# Patient Record
Sex: Male | Born: 1950 | Hispanic: No | Marital: Married | Smoking: Never smoker
Health system: Southern US, Community
[De-identification: ages and names within clinical notes are randomized; demographics above are authoritative.]

## PROBLEM LIST (undated history)

## (undated) DIAGNOSIS — Z8616 Personal history of COVID-19: Secondary | ICD-10-CM

## (undated) DIAGNOSIS — N400 Enlarged prostate without lower urinary tract symptoms: Secondary | ICD-10-CM

## (undated) DIAGNOSIS — J310 Chronic rhinitis: Secondary | ICD-10-CM

## (undated) DIAGNOSIS — E785 Hyperlipidemia, unspecified: Secondary | ICD-10-CM

## (undated) DIAGNOSIS — Z86010 Personal history of colonic polyps: Secondary | ICD-10-CM

## (undated) DIAGNOSIS — K4090 Unilateral inguinal hernia, without obstruction or gangrene, not specified as recurrent: Secondary | ICD-10-CM

## (undated) DIAGNOSIS — F4320 Adjustment disorder, unspecified: Secondary | ICD-10-CM

## (undated) DIAGNOSIS — F988 Other specified behavioral and emotional disorders with onset usually occurring in childhood and adolescence: Secondary | ICD-10-CM

## (undated) HISTORY — DX: Other specified behavioral and emotional disorders with onset usually occurring in childhood and adolescence: F98.8

## (undated) HISTORY — PX: CYSTOSCOPY: SUR368

## (undated) HISTORY — DX: Benign prostatic hyperplasia without lower urinary tract symptoms: N40.0

## (undated) HISTORY — DX: Adjustment disorder, unspecified: F43.20

## (undated) HISTORY — DX: Personal history of colonic polyps: Z86.010

## (undated) HISTORY — PX: KNEE ARTHROSCOPY: SHX127

## (undated) HISTORY — DX: Personal history of COVID-19: Z86.16

## (undated) HISTORY — DX: Chronic rhinitis: J31.0

## (undated) HISTORY — DX: Hyperlipidemia, unspecified: E78.5

---

## 1999-10-04 ENCOUNTER — Encounter: Admission: RE | Admit: 1999-10-04 | Discharge: 1999-10-29 | Payer: Self-pay | Admitting: Family Medicine

## 2002-02-11 ENCOUNTER — Ambulatory Visit (HOSPITAL_BASED_OUTPATIENT_CLINIC_OR_DEPARTMENT_OTHER): Admission: RE | Admit: 2002-02-11 | Discharge: 2002-02-11 | Payer: Self-pay | Admitting: Urology

## 2004-08-06 ENCOUNTER — Ambulatory Visit: Payer: Self-pay | Admitting: Internal Medicine

## 2004-08-14 ENCOUNTER — Ambulatory Visit: Payer: Self-pay | Admitting: Internal Medicine

## 2004-08-24 ENCOUNTER — Ambulatory Visit: Payer: Self-pay | Admitting: Internal Medicine

## 2005-01-10 ENCOUNTER — Ambulatory Visit: Payer: Self-pay | Admitting: Internal Medicine

## 2005-08-06 ENCOUNTER — Ambulatory Visit: Payer: Self-pay | Admitting: Internal Medicine

## 2005-08-13 ENCOUNTER — Ambulatory Visit: Payer: Self-pay | Admitting: Internal Medicine

## 2006-08-06 ENCOUNTER — Ambulatory Visit: Payer: Self-pay | Admitting: Internal Medicine

## 2006-08-06 LAB — CONVERTED CEMR LAB
ALT: 16 units/L (ref 0–40)
AST: 27 units/L (ref 0–37)
Albumin: 4 g/dL (ref 3.5–5.2)
Basophils Absolute: 0 10*3/uL (ref 0.0–0.1)
Basophils Relative: 0.3 % (ref 0.0–1.0)
Cholesterol: 206 mg/dL (ref 0–200)
GFR calc non Af Amer: 92 mL/min
Glomerular Filtration Rate, Af Am: 112 mL/min/{1.73_m2}
Monocytes Relative: 6.6 % (ref 3.0–11.0)
Neutro Abs: 3.7 10*3/uL (ref 1.4–7.7)
Neutrophils Relative %: 60.7 % (ref 43.0–77.0)
PSA: 0.84 ng/mL (ref 0.10–4.00)
TSH: 1.2 microintl units/mL (ref 0.35–5.50)
Total Bilirubin: 1.1 mg/dL (ref 0.3–1.2)
Triglyceride fasting, serum: 36 mg/dL (ref 0–149)

## 2006-09-10 ENCOUNTER — Ambulatory Visit: Payer: Self-pay | Admitting: Internal Medicine

## 2006-12-02 ENCOUNTER — Ambulatory Visit: Payer: Self-pay | Admitting: Internal Medicine

## 2007-07-10 ENCOUNTER — Telehealth: Payer: Self-pay | Admitting: *Deleted

## 2007-07-14 ENCOUNTER — Telehealth: Payer: Self-pay | Admitting: Internal Medicine

## 2007-07-18 HISTORY — PX: TOTAL KNEE ARTHROPLASTY: SHX125

## 2007-07-29 ENCOUNTER — Encounter: Payer: Self-pay | Admitting: Internal Medicine

## 2007-07-30 ENCOUNTER — Encounter: Payer: Self-pay | Admitting: Internal Medicine

## 2007-07-31 ENCOUNTER — Encounter: Admission: RE | Admit: 2007-07-31 | Discharge: 2007-09-07 | Payer: Self-pay | Admitting: Orthopedic Surgery

## 2007-09-03 ENCOUNTER — Ambulatory Visit: Payer: Self-pay | Admitting: Internal Medicine

## 2007-09-03 LAB — CONVERTED CEMR LAB
Bilirubin Urine: NEGATIVE
Ketones, urine, test strip: NEGATIVE
Nitrite: NEGATIVE
Specific Gravity, Urine: 1.025
WBC Urine, dipstick: NEGATIVE
pH: 5

## 2007-09-06 LAB — CONVERTED CEMR LAB
AST: 20 units/L (ref 0–37)
Albumin: 3.7 g/dL (ref 3.5–5.2)
Basophils Absolute: 0 10*3/uL (ref 0.0–0.1)
Basophils Relative: 0 % (ref 0.0–1.0)
Bilirubin, Direct: 0.2 mg/dL (ref 0.0–0.3)
CO2: 32 meq/L (ref 19–32)
Calcium: 9.6 mg/dL (ref 8.4–10.5)
Creatinine, Ser: 0.9 mg/dL (ref 0.4–1.5)
Eosinophils Absolute: 0.2 10*3/uL (ref 0.0–0.6)
Eosinophils Relative: 4.2 % (ref 0.0–5.0)
HDL: 35.6 mg/dL — ABNORMAL LOW (ref >39.0)
Hemoglobin: 13.1 g/dL (ref 13.0–17.0)
LDL Cholesterol: 130 mg/dL — ABNORMAL HIGH (ref 0–99)
Monocytes Absolute: 0.4 10*3/uL (ref 0.2–0.7)
Monocytes Relative: 7.6 % (ref 3.0–11.0)
PSA: 0.87 ng/mL (ref 0.10–4.00)
Platelets: 333 10*3/uL (ref 150–400)
RDW: 12.1 % (ref 11.5–14.6)
Total Protein: 7.1 g/dL (ref 6.0–8.3)
WBC: 5.9 10*3/uL (ref 4.5–10.5)

## 2007-09-08 ENCOUNTER — Telehealth: Payer: Self-pay | Admitting: *Deleted

## 2007-09-29 ENCOUNTER — Ambulatory Visit: Payer: Self-pay | Admitting: Internal Medicine

## 2007-09-29 DIAGNOSIS — IMO0002 Reserved for concepts with insufficient information to code with codable children: Secondary | ICD-10-CM | POA: Insufficient documentation

## 2007-09-29 DIAGNOSIS — E785 Hyperlipidemia, unspecified: Secondary | ICD-10-CM | POA: Insufficient documentation

## 2007-09-29 DIAGNOSIS — R7309 Other abnormal glucose: Secondary | ICD-10-CM | POA: Insufficient documentation

## 2007-09-29 DIAGNOSIS — F528 Other sexual dysfunction not due to a substance or known physiological condition: Secondary | ICD-10-CM | POA: Insufficient documentation

## 2007-09-29 DIAGNOSIS — M1710 Unilateral primary osteoarthritis, unspecified knee: Secondary | ICD-10-CM

## 2007-09-29 DIAGNOSIS — F4320 Adjustment disorder, unspecified: Secondary | ICD-10-CM | POA: Insufficient documentation

## 2007-09-29 DIAGNOSIS — F988 Other specified behavioral and emotional disorders with onset usually occurring in childhood and adolescence: Secondary | ICD-10-CM | POA: Insufficient documentation

## 2007-09-29 HISTORY — DX: Adjustment disorder, unspecified: F43.20

## 2007-10-20 ENCOUNTER — Ambulatory Visit: Payer: Self-pay | Admitting: Internal Medicine

## 2007-12-30 ENCOUNTER — Ambulatory Visit: Payer: Self-pay | Admitting: Internal Medicine

## 2007-12-30 LAB — CONVERTED CEMR LAB
HDL: 49.3 mg/dL (ref >39.0)
Hgb A1c MFr Bld: 5.8 % (ref 4.6–6.0)
Total CHOL/HDL Ratio: 4.5
VLDL: 9 mg/dL (ref 0–40)

## 2008-01-06 ENCOUNTER — Ambulatory Visit: Payer: Self-pay | Admitting: Internal Medicine

## 2008-03-08 ENCOUNTER — Ambulatory Visit: Payer: Self-pay | Admitting: Internal Medicine

## 2008-03-08 DIAGNOSIS — T887XXA Unspecified adverse effect of drug or medicament, initial encounter: Secondary | ICD-10-CM | POA: Insufficient documentation

## 2008-03-08 LAB — CONVERTED CEMR LAB
AST: 26 units/L (ref 0–37)
Albumin: 3.9 g/dL (ref 3.5–5.2)
Alkaline Phosphatase: 58 units/L (ref 39–117)
LDL Cholesterol: 115 mg/dL — ABNORMAL HIGH (ref 0–99)
Total CHOL/HDL Ratio: 3.6
Total Protein: 7.6 g/dL (ref 6.0–8.3)

## 2008-03-11 ENCOUNTER — Telehealth: Payer: Self-pay | Admitting: Internal Medicine

## 2008-04-13 ENCOUNTER — Telehealth: Payer: Self-pay | Admitting: *Deleted

## 2008-06-21 ENCOUNTER — Ambulatory Visit: Payer: Self-pay | Admitting: Internal Medicine

## 2008-06-23 ENCOUNTER — Telehealth: Payer: Self-pay | Admitting: Internal Medicine

## 2008-06-27 ENCOUNTER — Ambulatory Visit: Payer: Self-pay | Admitting: Internal Medicine

## 2008-06-30 ENCOUNTER — Ambulatory Visit: Payer: Self-pay | Admitting: Internal Medicine

## 2008-06-30 ENCOUNTER — Encounter: Payer: Self-pay | Admitting: Internal Medicine

## 2008-06-30 DIAGNOSIS — Z860100 Personal history of colon polyps, unspecified: Secondary | ICD-10-CM

## 2008-06-30 HISTORY — DX: Personal history of colonic polyps: Z86.010

## 2008-06-30 HISTORY — DX: Personal history of colon polyps, unspecified: Z86.0100

## 2008-07-06 ENCOUNTER — Encounter: Payer: Self-pay | Admitting: Internal Medicine

## 2008-07-06 ENCOUNTER — Ambulatory Visit: Payer: Self-pay | Admitting: Internal Medicine

## 2008-07-06 DIAGNOSIS — R05 Cough: Secondary | ICD-10-CM

## 2008-07-06 DIAGNOSIS — R059 Cough, unspecified: Secondary | ICD-10-CM | POA: Insufficient documentation

## 2008-08-22 ENCOUNTER — Telehealth: Payer: Self-pay | Admitting: *Deleted

## 2008-11-14 ENCOUNTER — Telehealth: Payer: Self-pay | Admitting: *Deleted

## 2008-11-29 ENCOUNTER — Ambulatory Visit: Payer: Self-pay | Admitting: Internal Medicine

## 2008-11-29 LAB — CONVERTED CEMR LAB
AST: 28 units/L (ref 0–37)
Alkaline Phosphatase: 62 units/L (ref 39–117)
BUN: 20 mg/dL (ref 6–23)
Blood in Urine, dipstick: NEGATIVE
CO2: 31 meq/L (ref 19–32)
Chloride: 105 meq/L (ref 96–112)
Eosinophils Absolute: 0.2 10*3/uL (ref 0.0–0.7)
GFR calc non Af Amer: 105.04 mL/min (ref >60)
Glucose, Bld: 91 mg/dL (ref 70–99)
Glucose, Urine, Semiquant: NEGATIVE
HDL: 46.6 mg/dL (ref >39.00)
Ketones, urine, test strip: NEGATIVE
LDL Cholesterol: 113 mg/dL — ABNORMAL HIGH (ref 0–99)
Monocytes Absolute: 0.5 10*3/uL (ref 0.1–1.0)
Nitrite: NEGATIVE
PSA: 0.79 ng/mL (ref 0.10–4.00)
Protein, U semiquant: NEGATIVE
RDW: 12.3 % (ref 11.5–14.6)
Sodium: 142 meq/L (ref 135–145)
Specific Gravity, Urine: 1.02
TSH: 1.62 microintl units/mL (ref 0.35–5.50)
Total CHOL/HDL Ratio: 4
Triglycerides: 57 mg/dL (ref 0.0–149.0)
VLDL: 11.4 mg/dL (ref 0.0–40.0)
WBC Urine, dipstick: NEGATIVE
WBC: 7.7 10*3/uL (ref 4.5–10.5)

## 2008-12-09 ENCOUNTER — Ambulatory Visit: Payer: Self-pay | Admitting: Internal Medicine

## 2009-03-07 ENCOUNTER — Ambulatory Visit: Payer: Self-pay | Admitting: Internal Medicine

## 2009-04-03 ENCOUNTER — Telehealth: Payer: Self-pay | Admitting: Internal Medicine

## 2009-04-17 ENCOUNTER — Telehealth: Payer: Self-pay | Admitting: Internal Medicine

## 2009-04-20 ENCOUNTER — Ambulatory Visit: Payer: Self-pay | Admitting: Internal Medicine

## 2009-04-20 DIAGNOSIS — F329 Major depressive disorder, single episode, unspecified: Secondary | ICD-10-CM | POA: Insufficient documentation

## 2009-04-21 ENCOUNTER — Ambulatory Visit: Payer: Self-pay | Admitting: Licensed Clinical Social Worker

## 2009-04-21 ENCOUNTER — Emergency Department (HOSPITAL_COMMUNITY): Admission: EM | Admit: 2009-04-21 | Discharge: 2009-04-22 | Payer: Self-pay | Admitting: Emergency Medicine

## 2009-11-29 ENCOUNTER — Ambulatory Visit: Payer: Self-pay | Admitting: Internal Medicine

## 2009-11-29 LAB — CONVERTED CEMR LAB
ALT: 28 units/L (ref 0–53)
AST: 32 units/L (ref 0–37)
Alkaline Phosphatase: 57 units/L (ref 39–117)
BUN: 21 mg/dL (ref 6–23)
Basophils Absolute: 0.1 10*3/uL (ref 0.0–0.1)
Bilirubin Urine: NEGATIVE
Blood in Urine, dipstick: NEGATIVE
Cholesterol: 209 mg/dL — ABNORMAL HIGH (ref 0–200)
Glucose, Urine, Semiquant: NEGATIVE
HCT: 44.3 % (ref 39.0–52.0)
HDL: 51.1 mg/dL (ref >39.00)
Hemoglobin: 14.5 g/dL (ref 13.0–17.0)
Ketones, urine, test strip: NEGATIVE
MCHC: 32.7 g/dL (ref 30.0–36.0)
MCV: 93.1 fL (ref 78.0–100.0)
Monocytes Absolute: 0.6 10*3/uL (ref 0.1–1.0)
Neutro Abs: 3.2 10*3/uL (ref 1.4–7.7)
Neutrophils Relative %: 48.5 % (ref 43.0–77.0)
PSA: 0.96 ng/mL (ref 0.10–4.00)
Potassium: 5 meq/L (ref 3.5–5.1)
Protein, U semiquant: NEGATIVE
RDW: 12.4 % (ref 11.5–14.6)
Sodium: 146 meq/L — ABNORMAL HIGH (ref 135–145)
TSH: 2.24 microintl units/mL (ref 0.35–5.50)
Total Bilirubin: 0.6 mg/dL (ref 0.3–1.2)
Total Protein: 8.4 g/dL — ABNORMAL HIGH (ref 6.0–8.3)
VLDL: 24.4 mg/dL (ref 0.0–40.0)
pH: 6

## 2009-12-15 ENCOUNTER — Ambulatory Visit: Payer: Self-pay | Admitting: Internal Medicine

## 2009-12-15 DIAGNOSIS — F1021 Alcohol dependence, in remission: Secondary | ICD-10-CM | POA: Insufficient documentation

## 2010-02-09 ENCOUNTER — Ambulatory Visit: Payer: Self-pay | Admitting: Internal Medicine

## 2010-02-09 LAB — CONVERTED CEMR LAB
ALT: 21 units/L (ref 0–53)
AST: 27 units/L (ref 0–37)
Albumin: 4 g/dL (ref 3.5–5.2)
Alkaline Phosphatase: 66 units/L (ref 39–117)
Cholesterol: 173 mg/dL (ref 0–200)
LDL Cholesterol: 117 mg/dL — ABNORMAL HIGH (ref 0–99)
Total Bilirubin: 0.4 mg/dL (ref 0.3–1.2)
Triglycerides: 52 mg/dL (ref 0.0–149.0)
VLDL: 10.4 mg/dL (ref 0.0–40.0)

## 2010-02-16 ENCOUNTER — Ambulatory Visit: Payer: Self-pay | Admitting: Internal Medicine

## 2010-02-16 ENCOUNTER — Telehealth: Payer: Self-pay | Admitting: *Deleted

## 2010-07-13 ENCOUNTER — Ambulatory Visit: Payer: Self-pay | Admitting: Internal Medicine

## 2010-07-13 LAB — CONVERTED CEMR LAB
Cholesterol: 180 mg/dL (ref 0–200)
VLDL: 15.8 mg/dL (ref 0.0–40.0)

## 2010-07-20 ENCOUNTER — Ambulatory Visit: Payer: Self-pay | Admitting: Internal Medicine

## 2010-08-17 ENCOUNTER — Ambulatory Visit: Payer: Self-pay | Admitting: Internal Medicine

## 2010-08-17 DIAGNOSIS — M79609 Pain in unspecified limb: Secondary | ICD-10-CM | POA: Insufficient documentation

## 2010-09-02 ENCOUNTER — Telehealth: Payer: Self-pay | Admitting: Internal Medicine

## 2010-10-18 NOTE — Progress Notes (Signed)
Summary: QUESTION ABOUT POTENTIAL LABS  Phone Note Call from Patient   Caller: Patient  [phone] Reason for Call: Talk to Doctor Summary of Call: Pt was in for OV today and made f/u appt with Dr Fabian Sharp in November 2011 but he wants to know if he needs to have any labwork done prior to his next OV...... Can you advise?  Initial call taken by: Debbra Riding,  February 16, 2010 9:32 AM  Follow-up for Phone Call        can do lipids panel  before next visit    . Not  due for full set of labs until  March 2012 Follow-up by: Madelin Headings MD,  February 19, 2010 1:15 PM  Additional Follow-up for Phone Call Additional follow up Details #1::        Left message on machine to call back to schedule fasting lipids.  Additional Follow-up by: Romualdo Bolk, CMA (AAMA),  February 19, 2010 1:18 PM

## 2010-10-18 NOTE — Assessment & Plan Note (Signed)
Summary: 5 MONTH ROV/NJR   Vital Signs:  Patient profile:   60 year old male Weight:      203 pounds BMI:     30.09 Pulse rate:   72 / minute BP sitting:   140 / 90  (left arm) Cuff size:   regular  Vitals Entered By: Romualdo Bolk, CMA (AAMA) (July 20, 2010 8:40 AM)  Nutrition Counseling: Patient's BMI is greater than 25 and therefore counseled on weight management options. CC: Follow-up visit on labs   History of Present Illness: Randall Torres comes in today  for follow up of lipids .  No se of meds  doing well but   has gained some weight eating more sweets.  exercising continuing. Here to review labs .  Still sees Psych in [place].  about every 90 days. sleeping well and no alcohol.   Preventive Screening-Counseling & Management  Alcohol-Tobacco     Alcohol drinks/day: 0     Alcohol type: none     Smoking Status: never  Caffeine-Diet-Exercise     Caffeine use/day: 1     Does Patient Exercise: yes     Type of exercise: swim  Comments: has continued alcohol free  Current Medications (verified): 1)  Lexapro 20 Mg Tabs (Escitalopram Oxalate) .Marland Kitchen.. 1 By Mouth Once Daily 2)  Simvastatin 40 Mg Tabs (Simvastatin) .Marland Kitchen.. 1 By Mouth Once Daily  Allergies (verified): No Known Drug Allergies  Past History:  Past medical, surgical, family and social histories (including risk factors) reviewed for relevance to current acute and chronic problems.  Past Medical History: Hyperlipidemia add ? BPH     has ssen urologist  Depressive rx   ETOH related   hosp [facility]  TD  12/07 Flu shot 2009 Colon  2009 EKG 2007  Past Surgical History: Reviewed history from 07/06/2008 and no changes required. medial compartmental left knee replacement  11/08    Past History:  Care Management: Urology:DR RON DAVIS  Gastroenterology: Leone Payor. [facility]iatrist t: [facility]  Family History: Reviewed history from 12/15/2009 and no changes required. Family History Diabetes  1st degree relative parent  cva 80 Family History Hypertension fam hx sleep problem   no depression. Brother died of Myasthenia gravis Family History High cholesterol no early scd or arrynthmias       Uncles with alcohol problems  Social History: Reviewed history from 02/16/2010 and no changes required. Occupation: Publishing rights manager. Married non smoker alcohol  now abstinent  exercise swims daily    Review of Systems  The patient denies anorexia, fever, and weight loss.         no limitation exercise  no se of meds   Physical Exam  General:  Well-developed,well-nourished,in no acute distress; alert,appropriate and cooperative throughout examination Psych:  Oriented X3, normally interactive, good eye contact, and not anxious appearing.     Impression & Recommendations:  Problem # 1:  HYPERLIPIDEMIA (ICD-272.4)  His updated medication list for this problem includes:    Simvastatin 40 Mg Tabs (Simvastatin) .Marland Kitchen... 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  otherwise doing well.   Problem # 4:  DEPRESSIVE DISORDER NOT ELSEWHERE CLASSIFIED (ICD-311) Assessment: Improved stable under care  His updated medication list for this problem includes:    Lexapro 20 Mg Tabs (Escitalopram oxalate) .Marland Kitchen... 1 by mouth once daily  Complete Medication List: 1)  Lexapro 20 Mg Tabs (Escitalopram oxalate) .Marland Kitchen.. 1 by mouth once daily 2)  Simvastatin 40 Mg Tabs (Simvastatin) .Marland Kitchen.. 1 by mouth once daily  Other Orders: TwinRix 1ml ( Hep A&B Adult dose) (57846) Admin 1st Vaccine (96295)  Patient Instructions: 1)   losesome weight avoid excess sweets and pastries  etc.   2)  You need to use up 3500 calories more than intake to lose one pound of body weight.  3)  continue meds .  4)  check up with labs in April 2012   Orders Added: 1)  Est. Patient Level III [99213] 2)  TwinRix 1ml ( Hep A&B Adult dose) [90636] 3)  Admin 1st Vaccine [90471]   Immunizations Administered:  TwinRix # 2:    Vaccine Type: TwinRix    Site: right deltoid    Mfr: GlaxoSmithKline    Dose: 1.0 ml    Route: IM    Given by: Romualdo Bolk, CMA (AAMA)    Exp. Date: 03/18/2011    Lot #: [id number]   Immunizations Administered:  TwinRix # 2:    Vaccine Type: TwinRix    Site: right deltoid    Mfr: GlaxoSmithKline    Dose: 1.0 ml    Route: IM    Given by: Romualdo Bolk, CMA (AAMA)    Exp. Date: 03/18/2011    Lot #: [id number]  greater than 50% of visit spent in counseling  15 minutes

## 2010-10-18 NOTE — Progress Notes (Signed)
  please call patient and ask about his leg status . ---- Converted from flag ---- ---- 08/20/2010 1:05 PM, Missy Al-Rammal, RVT, RDCS wrote: Dr. Fabian Sharp: FYI patient cancelled his Venous Duplex appointment today. ------------------------------

## 2010-10-18 NOTE — Assessment & Plan Note (Signed)
Summary: cpx/njr   Vital Signs:  Patient profile:   60 year old male Height:      69 inches Weight:      200 pounds BMI:     29.64 Pulse rate:   66 / minute BP sitting:   130 / 80  (right arm) Cuff size:   regular  Vitals Entered By: Romualdo Bolk, CMA (AAMA) (December 15, 2009 8:59 AM)  Nutrition Counseling: Patient's BMI is greater than 25 and therefore counseled on weight management options. CC: CPX   History of Present Illness: Randall Torres comesin today for      preventive visit .     Since his last viist he did a short IP program in [place] and dx as early alcoholic  and is now doing much better alcohol free and sleeping better on lexapro.      plan is to wean eventually   after a year ( August) . He is much happier and social although  has gained some weight   since then eating out more with family etc. Continues to exercise swimming without problem. Has stopped trial work and going to  paper work only for now.   LIPIDs: as above .  no se of meds . Ortho no change  ADD no change     Preventive Care Screening  Prior Values:    PSA:  0.96 (11/29/2009)    Colonoscopy:  Location:  [facility].   (06/30/2008)    Last Tetanus Booster:  Tdap (09/10/2006)   Preventive Screening-Counseling & Management  Alcohol-Tobacco     Alcohol drinks/day: 0     Smoking Status: never  Caffeine-Diet-Exercise     Caffeine use/day: 1     Does Patient Exercise: yes     Type of exercise: swim  Hep-HIV-STD-Contraception     Dental Visit-last 6 months yes     Sun Exposure-Excessive: no  Safety-Violence-Falls     Seat Belt Use: yes     Firearms in the Home: no firearms in the home     Smoke Detectors: yes      Blood Transfusions:  no.    EKG  Procedure date:  09/10/2006  Findings:       sinus rhythm with rate of:  56 No acute changes Ectopic atrial bradycardia  Current Medications (verified): 1)  Zocor 20 Mg  Tabs (Simvastatin) .Marland Kitchen.. 1 By Mouth Once  Daily 2)  Lexapro 20 Mg Tabs (Escitalopram Oxalate) .Marland Kitchen.. 1 By Mouth Once Daily  Allergies (verified): No Known Drug Allergies  Past History:  Family History: Last updated: 12/15/2009 Family History Diabetes 1st degree relative parent  cva 15 Family History Hypertension fam hx sleep problem   no depression. 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  Past Medical History: Hyperlipidemia add ? BPH     has ssen urologist   Depressive rx   ETOH related   [facility]osp [facility]  TD  12/07 Flu shot 2009 Colon  2009 EKG 2007  Past Surgical History: Reviewed history from 07/06/2008 and no changes required. medial compartmental left knee replacement  11/08    Past History:  Care Management: Urology:DR  RON DAVIS  Gastroenterology: Leone Payor. Psychologist: Renold Don  Family History: Reviewed history from 07/06/2008 and no changes required. Family History Diabetes 1st degree relative parent  cva 26 Family History Hypertension fam hx sleep problem   no depression. Brother died of Myasthenia gravis Family History High cholesterol no early scd or arrynthmias       Uncles with alcohol problems  Social History: Reviewed history from 12/09/2008 and no changes required. Occupation: Publishing rights manager. Married non smokeralcohol  now abstinent  exercise  Caffeine use/day:  1 Dental Care w/in 6 mos.:  yes Sun Exposure-Excessive:  no Seat Belt Use:  yes Blood Transfusions:  no  Review of Systems  The patient denies anorexia, fever, weight loss, vision loss, decreased hearing, hoarseness, chest pain, syncope, dyspnea on  exertion, peripheral edema, prolonged cough, headaches, hemoptysis, abdominal pain, melena, hematochezia, severe indigestion/heartburn, hematuria, incontinence, muscle weakness, suspicious skin lesions, transient blindness, difficulty walking, unusual weight change, abnormal bleeding, enlarged lymph nodes, angioedema, and testicular masses.         had been on flomax and no help in the past  Physical Exam General Appearance: well developed, well nourished, no acute distress Eyes: conjunctiva and lids normal, PERRLA, EOMI, WNL Ears, Nose, Mouth, Throat: TM clear, nares clear, oral exam WNL Neck: supple, no lymphadenopathy, no thyromegaly, no JVD Respiratory: clear to auscultation and percussion, respiratory effort normal Cardiovascular: regular rate and rhythm, S1-S2, no murmur, rub or gallop, no bruits, peripheral pulses normal and symmetric, no cyanosis, clubbing, edema or varicosities Chest: no scars, masses, tenderness; no asymmetry, skin changes, nipple discharge, no gynecomastia   Gastrointestinal: soft, non-tender; no hepatosplenomegaly, masses; active bowel sounds all quadrants, guaiac negative stool; no masses, tenderness, hemorrhoids  Genitourinary: no hernia, no mass 1+ at most  prostate enlargement Lymphatic: no cervical, axillary or inguinal adenopathy Musculoskeletal: gait normal, muscle tone and strength WNL, no joint swelling, effusions, discoloration, crepitus  healed scar left knee  Skin: clear, good turgor, color WNL, no rashes, lesions, or ulcerations toenails thickened  Neurologic: normal mental status, normal reflexes, normal strength, sensation, and motion Psychiatric: alert; oriented to person, place and time   mor relaxed and better eye contact.  Other Exam:  EKG  NSR  labs nl except ldl 147    Impression & Recommendations:  Problem # 1:  HEALTH MAINTENANCE EXAM (ICD-V70.0)  Discussed nutrition,exercise,diet,healthy weight, vitamin D and calcium.  Reviewed  preventive care protocols, scheduled due services, and updated immunizations.  Orders: EKG w/ Interpretation (93000)  Problem # 2:  HYPERLIPIDEMIA (ICD-272.4) Assessment: Deteriorated  related to weight gain and lifestyle change with alcohol abstinence   that is expected to improve     counseled  The following medications were removed from the medication list:    Zocor 20 Mg Tabs (Simvastatin) .Marland Kitchen... 1 by mouth once daily His updated medication list for this problem includes:    Simvastatin 40 Mg Tabs (Simvastatin) .Marland Kitchen... 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     Complete Medication List: 1)  Lexapro 20 Mg Tabs (Escitalopram oxalate) .Marland Kitchen.. 1 by mouth once daily 2)  Simvastatin 40 Mg Tabs (Simvastatin) .Marland Kitchen.. 1 by mouth once daily  Patient Instructions: 1)  increase  zocor 40 mg  1 by mouth once daily .  2)  avoid calories in beverages   3)   monitor   eating out. 4)  Please schedule a follow-up appointment in 2 months.  5)  Hepatic Panel prior to visit ICD-9:   272.4  6)  Lipid panel prior to visit ICD-9 :  Prescriptions: SIMVASTATIN 40 MG TABS (SIMVASTATIN) 1 by mouth once daily  #30 x 3   Entered and Authorized by:   Madelin Headings MD   Signed by:   Madelin Headings MD on 12/15/2009   Method used:   Electronically to        [facility]. [phone]* (retail)       [address]       [place], [place]  [address]       Ph: [phone]       Fax: [phone]   RxID:   [id number]

## 2010-10-18 NOTE — Miscellaneous (Signed)
Summary: Orders Update  Clinical Lists Changes  Orders: Added new Test order of Venous Duplex Lower Extremity (Venous Duplex Lower) - Signed  Appended Document: Orders Update pt cancelled his doppler   contact about  the status of his leg pain.   Appended Document: Orders Update Left message for pt to call back.  Appended Document: Orders Update Spoke to pt and he is doing fine. The pain went away.

## 2010-10-18 NOTE — Assessment & Plan Note (Signed)
Summary: calf pain/dm   Vital Signs:  Patient profile:   60 year old male Weight:      198 pounds Temp:     98.5 degrees F oral Pulse rate:   58 / minute Pulse rhythm:   regular BP sitting:   148 / 88  (left arm) Cuff size:   regular  Vitals Entered By: Kern Reap CMA Duncan Dull) (August 17, 2010 10:26 AM) CC: left calf pain Is Patient Diabetic? No Pain Assessment Patient in pain? yes     Location: calf Intensity: 8 Type: sharp Onset of pain  With activity   History of Present Illness: Randall Torres comes in today  for one month of left calf pain and cramp like.  Onset day after  yard work and  then football game at [employer] with lots of steps.   sharp to cramp pain on walking  and  not  continuing   . no rx but aleve at onset. Worse with walking swimming and weight bearing.  ok to sleep.  hard to swim still.   No bruising contusion injury bleeding or hs of dvt.   has been losing weigh twith lifestyle intervention  as directed   Allergies: No Known Drug Allergies  Past History:  Past medical, surgical, family and social histories (including risk factors) reviewed, and no changes noted (except as noted below).  Past Medical History: Reviewed history from 07/20/2010 and no changes required. Hyperlipidemia add ? BPH     has ssen urologist  Depressive rx   ETOH related   hosp [facility] to [facility]  TD  12/07 Flu shot 2009 Colon  2009 EKG 2007  Past Surgical History: Reviewed history from 07/06/2008 and no changes required. medial compartmental left knee replacement  11/08    Family History: Reviewed history from 12/15/2009 and no changes required. Family History Diabetes 1st degree relative parent  cva 64 Family History Hypertension fam hx sleep problem   no depression. Brother died of Myasthenia gravis Family History High cholesterol no early scd or arrynthmias       Uncles with alcohol problems  Social History: Reviewed history from 02/16/2010 and no changes  required. 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         him limitations of exercise related to his left calf pain. No other change in his health. He is still abstinent of alcohol  Physical Exam  General:  Well-developed,well-nourished,in no acute distress; alert,appropriate and cooperative throughout examination Head:  normocephalic and atraumatic.   Lungs:  normal respiratory effort and no intercostal retractions.   Msk:  left knee surgical scars  nno swelling .  nl rom   no redness or warmth .. tender  at mid lower calf  achilles ? ok  rom nl pain with walking but not much of a limp.  Pulses:  nl  Extremities:  no clubbing cyanosis or edema  Neurologic:  non focal  Skin:  turgor normal, color normal, no ecchymoses, and no petechiae.   Psych:  Oriented X3, good eye contact, not anxious appearing, and not depressed appearing.     Impression & Recommendations:  Problem # 1:  CALF PAIN, LEFT (ICD-729.5) curious  that not better if an overuse injury ... will get doppler to r/o dvt  because is persistent.     ?  achillies  tendinous attachment?  .   discussed evaluation and treatment plan. Since it is been a whole month and it is continuing to limit his exercise activity and limp set times who get sports medicine opinion if his Doppler is negative. Orders: Radiology Referral (Radiology)  Problem # 2:  Preventive Health Care (ICD-V70.0) has lost weight since last visit to continue.  Problem # 3:  DEPRESSIVE DISORDER NOT ELSEWHERE CLASSIFIED (ICD-311) Assessment: Improved doing well seeing specialist. Exercise has helped with mood also so importance to getting back to optimum physical function. His updated medication list for this problem includes:    Lexapro 20 Mg Tabs (Escitalopram oxalate) .Marland Kitchen... 1 by mouth once  daily  Problem # 5:  OTHER POSTSURGICAL STATUS OTHERL KNEE (ICD-V45.89)  Complete Medication List: 1)  Lexapro 20 Mg Tabs (Escitalopram oxalate) .Marland Kitchen.. 1 by mouth once daily 2)  Simvastatin 40 Mg Tabs (Simvastatin) .Marland Kitchen.. 1 by mouth once daily  Patient Instructions: 1)  will do doppler test of leg   2)  if negative then will do a referral to  sports medicine. Dr Darrick Penna . 3)  aleve two times a day in the meantime is ok.  4)  continue to lose weight.   Orders Added: 1)  Radiology Referral [Radiology] 2)  Est. Patient Level IV [59563]

## 2010-10-18 NOTE — Assessment & Plan Note (Signed)
Summary: 2 month fup//ccm   Vital Signs:  Patient profile:   60 year old male Weight:      199 pounds BMI:     29.49 Pulse rate:   66 / minute BP sitting:   130 / 80  (left arm) Cuff size:   regular  Vitals Entered By: Romualdo Bolk, CMA (AAMA) (February 16, 2010 8:29 AM) CC: Follow-up visit on labs   History of Present Illness: Blessing Ozga comes in today  . for follow up of  med change . LIPIDs: nose of meds  taking higher dose . still exercising .swims  amile .  Mood : still in remission doing well  goes to meeting daily  sleeping 7 -8 hours  . mood stable   Weight : hard to lose weight  eating more since off alcohol etc.   trying .  wants to lose weight.    Preventive Screening-Counseling & Management  Alcohol-Tobacco     Alcohol drinks/day: 0     Smoking Status: never  Caffeine-Diet-Exercise     Caffeine use/day: 1     Does Patient Exercise: yes     Type of exercise: swim  Comments: stopped alcohol 10 months ago   Current Medications (verified): 1)  Lexapro 20 Mg Tabs (Escitalopram Oxalate) .Marland Kitchen.. 1 By Mouth Once Daily 2)  Simvastatin 40 Mg Tabs (Simvastatin) .Marland Kitchen.. 1 By Mouth Once Daily  Allergies (verified): No Known Drug Allergies  Past History:  Past medical, surgical, family and social histories (including risk factors) reviewed, and no changes noted (except as noted below).  Past Medical History: Reviewed history from 12/15/2009 and no changes required. Hyperlipidemia add ? BPH     has ssen urologist   Depressive rx   ETOH related   hosp 2010 [facility] to [facility]  TD  12/07 Flu shot 2009 Colon  2009 EKG 2007  Past Surgical History: Reviewed history from 07/06/2008 and no changes required. medial compartmental left knee replacement  11/08    Past History:  Care Management: Urology:DR RON DAVIS  Gastroenterology: Leone Payor. Psychologist: Renold Don  Family History: Reviewed history from 12/15/2009 and no changes required. Family History  Diabetes 1st degree relative parent  cva 60 Family History Hypertension fam hx sleep problem   no depression. Brother died of Myasthenia gravis Family History High cholesterol no early scd or arrynthmias       Uncles with alcohol problems  Social History: Reviewed history from 12/15/2009 and no changes required. 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         no cv pulm problems   Physical Exam  General:  alert, well-developed, and well-nourished.   Psych:  Oriented X3, normally interactive, good eye contact, not anxious appearing, and not depressed appearing.   reviewed labs and physical parameters.   has lost 1 #   Impression & Recommendations:  Problem # 1:  HYPERLIPIDEMIA (ICD-272.4) Assessment Improved intensify lifestyle intervention actually doing very well .    swims a mile    .    add  other  .    His updated medication list for this problem includes:    Simvastatin 40 Mg Tabs (Simvastatin) .Marland Kitchen... 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    continue   Complete Medication List: 1)  Lexapro 20 Mg Tabs (Escitalopram oxalate) .Marland Kitchen.. 1 by mouth once daily 2)  Simvastatin 40 Mg Tabs (Simvastatin) .Marland Kitchen.. 1 by mouth once daily  Patient Instructions: 1)  ROV in November  2)  resistence exercise  , portion control . 3)  eating awareness.

## 2010-11-19 ENCOUNTER — Other Ambulatory Visit: Payer: Self-pay | Admitting: Internal Medicine

## 2010-12-22 LAB — BASIC METABOLIC PANEL
BUN: 19 mg/dL (ref 6–23)
CO2: 28 mEq/L (ref 19–32)
Chloride: 104 mEq/L (ref 96–112)
Potassium: 3.8 mEq/L (ref 3.5–5.1)

## 2010-12-22 LAB — CBC
HCT: 44 % (ref 39.0–52.0)
MCHC: 33.8 g/dL (ref 30.0–36.0)
MCV: 90.8 fL (ref 78.0–100.0)
Platelets: 310 10*3/uL (ref 150–400)
WBC: 9.9 10*3/uL (ref 4.0–10.5)

## 2010-12-22 LAB — ETHANOL: Alcohol, Ethyl (B): <5 mg/dL (ref 0–10)

## 2010-12-22 LAB — RAPID URINE DRUG SCREEN, HOSP PERFORMED
Barbiturates: NOT DETECTED
Benzodiazepines: POSITIVE — AB

## 2010-12-22 LAB — DIFFERENTIAL
Basophils Relative: 0 % (ref 0–1)
Eosinophils Absolute: 0.1 10*3/uL (ref 0.0–0.7)
Eosinophils Relative: 1 % (ref 0–5)
Lymphs Abs: 2.3 10*3/uL (ref 0.7–4.0)
Monocytes Relative: 4 % (ref 3–12)

## 2010-12-24 ENCOUNTER — Other Ambulatory Visit: Payer: Self-pay

## 2010-12-25 ENCOUNTER — Other Ambulatory Visit (INDEPENDENT_AMBULATORY_CARE_PROVIDER_SITE_OTHER): Payer: Federal, State, Local not specified - PPO

## 2010-12-25 ENCOUNTER — Other Ambulatory Visit: Payer: Self-pay | Admitting: Internal Medicine

## 2010-12-25 DIAGNOSIS — Z0000 Encounter for general adult medical examination without abnormal findings: Secondary | ICD-10-CM

## 2010-12-25 DIAGNOSIS — Z0389 Encounter for observation for other suspected diseases and conditions ruled out: Secondary | ICD-10-CM

## 2010-12-25 LAB — URINALYSIS
Bilirubin Urine: NEGATIVE
Ketones, ur: NEGATIVE
Nitrite: NEGATIVE
Total Protein, Urine: NEGATIVE
Urine Glucose: NEGATIVE
pH: 5 (ref 5.0–8.0)

## 2010-12-25 LAB — BASIC METABOLIC PANEL
BUN: 22 mg/dL (ref 6–23)
Calcium: 9.1 mg/dL (ref 8.4–10.5)
GFR: 99.98 mL/min (ref >60.00)
Glucose, Bld: 83 mg/dL (ref 70–99)
Sodium: 140 mEq/L (ref 135–145)

## 2010-12-25 LAB — CBC WITH DIFFERENTIAL/PLATELET
Basophils Absolute: 0 10*3/uL (ref 0.0–0.1)
HCT: 42.8 % (ref 39.0–52.0)
Hemoglobin: 14.7 g/dL (ref 13.0–17.0)
Lymphs Abs: 2.1 10*3/uL (ref 0.7–4.0)
MCHC: 34.3 g/dL (ref 30.0–36.0)
Monocytes Relative: 8.2 % (ref 3.0–12.0)
Neutro Abs: 2.6 10*3/uL (ref 1.4–7.7)
RDW: 13.3 % (ref 11.5–14.6)

## 2010-12-25 LAB — LIPID PANEL
Cholesterol: 173 mg/dL (ref 0–200)
HDL: 47.8 mg/dL (ref >39.00)
VLDL: 13.4 mg/dL (ref 0.0–40.0)

## 2010-12-25 LAB — HEPATIC FUNCTION PANEL: Albumin: 3.9 g/dL (ref 3.5–5.2)

## 2010-12-27 ENCOUNTER — Encounter: Payer: Self-pay | Admitting: Internal Medicine

## 2010-12-31 ENCOUNTER — Ambulatory Visit (INDEPENDENT_AMBULATORY_CARE_PROVIDER_SITE_OTHER)
Admission: RE | Admit: 2010-12-31 | Discharge: 2010-12-31 | Disposition: A | Discharge status: Home / Self Care | Payer: Federal, State, Local not specified - PPO | Source: Ambulatory Visit | Attending: Internal Medicine | Admitting: Internal Medicine

## 2010-12-31 ENCOUNTER — Encounter: Payer: Self-pay | Admitting: Internal Medicine

## 2010-12-31 ENCOUNTER — Ambulatory Visit (INDEPENDENT_AMBULATORY_CARE_PROVIDER_SITE_OTHER): Payer: Federal, State, Local not specified - PPO | Admitting: Internal Medicine

## 2010-12-31 VITALS — BP 120/80 | HR 60 | Ht 68.5 in | Wt 196.0 lb

## 2010-12-31 DIAGNOSIS — Z0000 Encounter for general adult medical examination without abnormal findings: Secondary | ICD-10-CM

## 2010-12-31 DIAGNOSIS — R05 Cough: Secondary | ICD-10-CM

## 2010-12-31 DIAGNOSIS — R059 Cough, unspecified: Secondary | ICD-10-CM

## 2010-12-31 DIAGNOSIS — F1021 Alcohol dependence, in remission: Secondary | ICD-10-CM

## 2010-12-31 DIAGNOSIS — E785 Hyperlipidemia, unspecified: Secondary | ICD-10-CM

## 2010-12-31 DIAGNOSIS — J310 Chronic rhinitis: Secondary | ICD-10-CM | POA: Insufficient documentation

## 2010-12-31 HISTORY — DX: Chronic rhinitis: J31.0

## 2010-12-31 MED ORDER — FLUTICASONE PROPIONATE 50 MCG/ACT NA SUSP: 2.0000 | Freq: Every day | NASAL | Status: DC

## 2010-12-31 NOTE — Progress Notes (Signed)
Subjective:    Patient ID: Randall Torres, male    DOB: 04-22-1951, 60 y.o.   MRN: [id number]  HPI Patient comes in for his regular checkup. Since his last visit he has done fairly well.  Weaning  lexapro per his psych.  But doing ok. His daily AA meetings. 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 No panic attacks. COugh : He has had an intermittent cough that his wife is worried about in the last month or so. It is not a deep cough not associated with wheezing shortness of breath are significantly worse at night. He does have some chronic nasal congestion he considers allergy.  A friend recently had lung cancer and his wife is worried about the cause of his cough and he would like an opinion.  He has some snoring at night but no diagnosis of sleep apnea doesn't really want to be evaluated. LIPIDS:  No side effects of medicine and hasn't been able to lose much weight but does exercise on a regular basis. Asked about seeing a nutritionist to help with his weight management hyperlipidemia and healthy eating. Has a question about testosterone doesn't think he has low testosterone by history of fatigue and other symptoms but wonders if it is he uses an Animal nutritionist as advertised and social media and elsewhere. Review of Systems Negative chest pain shortness of breath major changes in hearing vision orthopedics the same no numbness weakness syncope.    Objective:   Physical Exam Physical Exam: Vital signs reviewed WUJ:WJXB is a well-developed well-nourished alert cooperative  White male  who appears   stated age in no acute distress.  HEENT: normocephalic  traumatic , Eyes: PERRL EOM's full, conjunctiva clear, Nares: patent no deformity discharge or tenderness. Moderate congestion, Ears: no deformity EAC's clear TMs with normal landmarks. Mouth: clear OP, no lesions, edema.  Moist mucous membranes. Dentition in adequate repair. NECK: supple without masses, thyromegaly or  bruits. CHEST/PULM:  Clear to auscultation and percussion breath sounds equal no wheeze , rales or rhonchi. No chest wall deformities or tenderness. CV: PMI is nondisplaced, S1 S2 no gallops, murmurs, rubs. Peripheral pulses are full without delay.No JVD .  ABDOMEN: Bowel sounds normal nontender  No guard or rebound, no hepato splenomegal no CVA tenderness.  No hernia. Extremtities:  No clubbing cyanosis or edema, no acute joint swelling or redness no focal atrophy  well-healed scar left knee NEURO:  Oriented x3, cranial nerves 3-12 appear to be intact, no obvious focal weakness,gait within normal limits no abnormal reflexes or asymmetrical SKIN: No acute rashes normal turgor, color, no bruising or petechiae.  Some toenail thickening no redness. PSYCH: Oriented, good eye contact, no obvious depression anxiety, cognition and judgment appear normal. LN:  No cervical axillary or inguinal adenopathy REctal No masses prostate 1+ no nodules or tenderness.    labs reviewed      Assessment & Plan:  Preventive Health Care  utd  COUGH seems almost like a throat clearing  or upper airway .   Suspect allergy  Or such.   c xray and  Add INCS and follow up   Other intervention depending on status at that time. LIPIDS no se of meds could be better ldl Recovering alcohol depression  Much better   Weaning meds  WEIGHT  Wants to do better losing weight and   Will do nutrition consult at this time .  Counseled.   We also discussed the use of testosterone and high diagnosis  of hypogonadism. At this time he does not appear to have significant symptoms of such but can consider getting lab work if he does in the future discussed use of medication as replacement and treatment. At this time we will monitor.

## 2010-12-31 NOTE — Patient Instructions (Addendum)
Someone will contact you about  nutritional consult  Gt CXRAY and begin nasal cortisone for allergy .  Can add claritin OTC if needed  Also  ROV in 6-8 weeks   About the cough. Check up in a year

## 2010-12-31 NOTE — Assessment & Plan Note (Signed)
Possible allergic chronic or reactive reasonable to use inhaled nasal cortisone this might also help his snoring he does have.

## 2011-01-01 ENCOUNTER — Telehealth: Payer: Self-pay | Admitting: *Deleted

## 2011-01-01 NOTE — Telephone Encounter (Signed)
Pt aware of results 

## 2011-01-01 NOTE — Telephone Encounter (Signed)
Left message to call back  

## 2011-01-01 NOTE — Telephone Encounter (Signed)
Message copied by Tor Netters on Tue Jan 01, 2011  8:16 AM ------      Message from: [facility], [place]      Created: Mon Dec 31, 2010  5:36 PM       Tell patient that x ray shows no active disease

## 2011-01-14 ENCOUNTER — Encounter: Payer: Federal, State, Local not specified - PPO | Attending: Internal Medicine | Admitting: *Deleted

## 2011-01-14 DIAGNOSIS — Z713 Dietary counseling and surveillance: Secondary | ICD-10-CM | POA: Insufficient documentation

## 2011-01-14 DIAGNOSIS — E785 Hyperlipidemia, unspecified: Secondary | ICD-10-CM | POA: Insufficient documentation

## 2011-01-30 ENCOUNTER — Telehealth: Payer: Self-pay | Admitting: *Deleted

## 2011-01-30 NOTE — Telephone Encounter (Signed)
Pt called stating that he has been swimming a lot. He is now having redness and pain in his rotator cuff area. He thinks that he may have a bone spur. He wanted to know if he should go see Dr. Thomasena Edis instead of coming here. I told pt that would be fine so they can do the x-rays that they need. Pt agreed with this.

## 2011-02-01 NOTE — Op Note (Signed)
[facility]  Patient:    ALHAJI, MCNEAL Visit Number: [id number] MRN: [id number]          Service Type: NES Location: [facility] Attending Physician:  Monica Becton Dictated by:   Claudette Laws, M.D. Proc. Date: 02/11/02 Admit Date:  02/11/2002                             Operative Report  PREOPERATIVE DIAGNOSES: 1. Benign prostatic hypertrophy with bladder outlet obstruction. 2. Status post vaporization laser ablation of the prostate procedure    and transurethral incision of bladder neck.  POSTOPERATIVE DIAGNOSIS: 1. Benign prostatic hypertrophy with bladder outlet obstruction. 2. Status post vaporization laser ablation of the prostate procedure    and transurethral incision of bladder neck.  OPERATION:  Cystoscopy and transurethral needle ablation of the prostate (TUNA procedure).  SURGEON:  Claudette Laws, M.D.  DESCRIPTION OF PROCEDURE:  The patient was prepped and draped in the dorsal lithotomy position under LMA anesthesia.  Cystoscopy confirmed a normal anterior urethra.  The prostatic urethra was slightly elongated.  There was some residual lateral lobe tissue mainly on the left side. Some residual tissue on the right side.  The bladder neck appeared somewhat scarred from the prior transurethral incision.  There was not much if any median lobe.  We elected not to treat the bladder neck at all.  We then using the three minute soft ware for the TUNA, transurethral needle ablation was performed.  A total of six sticks were used.  The first one at the left bladder neck at the needle length of 12.  The second transverse at 14 needle length.  The third on the right at the bladder neck 14.  The fourth left bladder neck distal 14.  Right transverse 16 and the last was on the left just inside the veru at 18 setting.  He appeared to have a good treatment.  Appropriate pictures were taken. Xylocaine jelly was instilled per urethra for  anesthesia.  A #18 French 8 cc Foley catheter was inserted and a B&O suppository was placed per rectum for bladder spasms.  The patient was then taken back to the recovery room in satisfactory condition. Dictated by:   Claudette Laws, M.D. Attending Physician:  Monica Becton DD:  02/11/02 TD:  02/12/02 Job: [id number]

## 2011-02-12 ENCOUNTER — Encounter: Payer: Self-pay | Admitting: Internal Medicine

## 2011-02-12 ENCOUNTER — Ambulatory Visit (INDEPENDENT_AMBULATORY_CARE_PROVIDER_SITE_OTHER): Payer: Federal, State, Local not specified - PPO | Admitting: Internal Medicine

## 2011-02-12 ENCOUNTER — Ambulatory Visit: Payer: Federal, State, Local not specified - PPO | Admitting: *Deleted

## 2011-02-12 VITALS — BP 120/80 | HR 66 | Wt 192.0 lb

## 2011-02-12 DIAGNOSIS — R05 Cough: Secondary | ICD-10-CM

## 2011-02-12 DIAGNOSIS — E785 Hyperlipidemia, unspecified: Secondary | ICD-10-CM

## 2011-02-12 DIAGNOSIS — R21 Rash and other nonspecific skin eruption: Secondary | ICD-10-CM

## 2011-02-12 DIAGNOSIS — J310 Chronic rhinitis: Secondary | ICD-10-CM

## 2011-02-12 DIAGNOSIS — R059 Cough, unspecified: Secondary | ICD-10-CM

## 2011-02-12 NOTE — Progress Notes (Signed)
  Subjective:    Patient ID: Randall Torres, male    DOB: 1951-01-06, 60 y.o.   MRN: [id number]  HPI Patient comes in today for followup of her prolonged cough. Since his last visit he is uses Flonase pretty regularly until he ran out about a week ago. His cough resolved except for specific episodes there was a reason obvious. He has "a back after his been off of the Flonase. No chest pain shortness of breath continues to exercise.  He also has a red spot rash that occurs after swimming over his right shoulder has seen the orthopedist they have recommended he see the dermatologist. He has no symptoms with it but his wife noticed it significantly in the recent past. No itching or hives with this  Hyperlipidemia: He did see the nutritionist and although he did not understand everything she said it was helpful to him and he's lost a few pounds since that time.     Review of Systems Negative for chest pain shortness of breath syncope other major changes in health status. Past Medical History  Diagnosis Date  . Hyperlipidemia   . ADD (attention deficit disorder)   . BPH (benign prostatic hyperplasia)     has seen urologist  . Depression     ETOH related hosp 2010   Past Surgical History  Procedure Date  . 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 He does not have any smokeless tobacco history on file. His alcohol and drug histories not on file. family history includes Alcohol abuse in an unspecified family member; Diabetes in an unspecified family member; Hyperlipidemia in an unspecified family member; Myasthenia gravis in his brother; and Other in some unspecified family members. No Known Allergies     Objective:   Physical Exam Well-developed well-nourished in no acute distress weight down 4 pounds. Vital signs stable. Right shoulder looks normal there is no real rash other there is a faint erythema subcutaneously over the  anterior shoulder. It is not well demarcated. There is no scaling.    Oriented x 3 and no noted deficits in memory, attention, and speech.    Assessment & Plan:  Cough  pretty much resolved when he was on the Flonase regularly. Doubly reactive postnasal drainage etc. Discussed plan go back on the Flonase until cough controlled and then can try as needed. No further workup needed at this time.  Rash right shoulder related to exercise, unsure what this could be probably a benign condition suggest he take a picture of it at its worse before he goes to the dermatologist for better assessment.  Hyperlipidemia weigh;t has reduced his weight continuing dietary changes   We'll be coming in for followup for his regular checkup.

## 2011-02-13 ENCOUNTER — Ambulatory Visit: Payer: Federal, State, Local not specified - PPO | Admitting: *Deleted

## 2011-07-22 ENCOUNTER — Ambulatory Visit (INDEPENDENT_AMBULATORY_CARE_PROVIDER_SITE_OTHER): Payer: Federal, State, Local not specified - PPO | Admitting: Internal Medicine

## 2011-07-22 DIAGNOSIS — Z23 Encounter for immunization: Secondary | ICD-10-CM

## 2011-09-13 ENCOUNTER — Other Ambulatory Visit: Payer: Self-pay | Admitting: Internal Medicine

## 2011-10-11 ENCOUNTER — Ambulatory Visit (INDEPENDENT_AMBULATORY_CARE_PROVIDER_SITE_OTHER): Payer: Federal, State, Local not specified - PPO | Admitting: Family

## 2011-10-11 ENCOUNTER — Encounter: Payer: Self-pay | Admitting: Family

## 2011-10-11 DIAGNOSIS — R059 Cough, unspecified: Secondary | ICD-10-CM

## 2011-10-11 DIAGNOSIS — E7800 Pure hypercholesterolemia, unspecified: Secondary | ICD-10-CM

## 2011-10-11 DIAGNOSIS — J069 Acute upper respiratory infection, unspecified: Secondary | ICD-10-CM

## 2011-10-11 DIAGNOSIS — R05 Cough: Secondary | ICD-10-CM

## 2011-10-11 MED ORDER — FEXOFENADINE-PSEUDOEPHED ER 180-240 MG PO TB24: 1.0000 | ORAL_TABLET | Freq: Every day | ORAL | Status: AC

## 2011-10-11 MED ORDER — AMOXICILLIN 500 MG PO TABS: 1000.0000 mg | ORAL_TABLET | Freq: Two times a day (BID) | ORAL | Status: AC

## 2011-10-11 NOTE — Progress Notes (Signed)
  Subjective:    Patient ID: Randall Torres, male    DOB: Apr 11, 1951, 61 y.o.   MRN: [id number]  HPI 61 year old male, patient of Dr. Maisie Fus is in today with complaints of sore throat, cough, fever, chills x3 days. He has been using saltwater gargles and a nondrowsy allergy tablet with little to no relief. He is a nonsmoker. Denies any lightheadedness, dizziness, chest pain, shortness of breath, palpitations or edema.    Review of Systems  HENT: Positive for congestion, sneezing and sinus pressure.   Respiratory: Positive for cough.   Cardiovascular: Negative.   Genitourinary: Negative.   Musculoskeletal: Negative.   Neurological: Negative.   Hematological: Negative.        Objective:   Physical Exam  Constitutional: He is oriented to person, place, and time. He appears well-developed.  HENT:  Left Ear: External ear normal.  Nose: Nose normal.  Mouth/Throat: Oropharynx is clear and moist.  Neck: Normal range of motion. Neck supple.  Cardiovascular: Normal rate, regular rhythm and normal heart sounds.   Pulmonary/Chest: Effort normal.  Musculoskeletal: Normal range of motion.  Neurological: He is alert and oriented to person, place, and time.  Skin: Skin is warm and dry.  Psychiatric: He has a normal mood and affect.          Assessment & Plan:  Assessment: Acute sinusitis, cough  Plan: Allegra-D 24 hours once daily. Prescription given from amoxicillin 500 mg 2 tabs by mouth twice a day x10 days. The patient is aware he is not to get the prescription filled unless his symptoms worsen over the weekend. All the office with any questions or concerns. Rest. Drink plenty of fluids. Recheck as scheduled.

## 2011-10-11 NOTE — Patient Instructions (Signed)

## 2011-12-25 ENCOUNTER — Other Ambulatory Visit (INDEPENDENT_AMBULATORY_CARE_PROVIDER_SITE_OTHER): Payer: Federal, State, Local not specified - PPO

## 2011-12-25 DIAGNOSIS — Z0000 Encounter for general adult medical examination without abnormal findings: Secondary | ICD-10-CM

## 2011-12-25 LAB — POCT URINALYSIS DIPSTICK
Glucose, UA: NEGATIVE
Nitrite, UA: NEGATIVE
Protein, UA: NEGATIVE
Urobilinogen, UA: 0.2

## 2011-12-25 LAB — TSH: TSH: 1.71 u[IU]/mL (ref 0.35–5.50)

## 2011-12-25 LAB — CBC WITH DIFFERENTIAL/PLATELET
Basophils Absolute: 0 10*3/uL (ref 0.0–0.1)
Eosinophils Absolute: 0.2 10*3/uL (ref 0.0–0.7)
HCT: 42.7 % (ref 39.0–52.0)
Lymphs Abs: 2.2 10*3/uL (ref 0.7–4.0)
MCHC: 33.2 g/dL (ref 30.0–36.0)
MCV: 90.4 fl (ref 78.0–100.0)
Monocytes Absolute: 0.4 10*3/uL (ref 0.1–1.0)
Monocytes Relative: 7 % (ref 3.0–12.0)
Platelets: 251 10*3/uL (ref 150.0–400.0)
RDW: 13.2 % (ref 11.5–14.6)

## 2011-12-25 LAB — LIPID PANEL
Cholesterol: 160 mg/dL (ref 0–200)
Total CHOL/HDL Ratio: 3
Triglycerides: 34 mg/dL (ref 0.0–149.0)

## 2011-12-25 LAB — PSA: PSA: 0.83 ng/mL (ref 0.10–4.00)

## 2011-12-25 LAB — HEPATIC FUNCTION PANEL: Total Bilirubin: 0.6 mg/dL (ref 0.3–1.2)

## 2011-12-25 LAB — BASIC METABOLIC PANEL
BUN: 21 mg/dL (ref 6–23)
GFR: 96.95 mL/min (ref >60.00)
Glucose, Bld: 85 mg/dL (ref 70–99)
Potassium: 5.5 mEq/L — ABNORMAL HIGH (ref 3.5–5.1)

## 2012-01-01 ENCOUNTER — Encounter: Payer: Self-pay | Admitting: Internal Medicine

## 2012-01-01 ENCOUNTER — Ambulatory Visit (INDEPENDENT_AMBULATORY_CARE_PROVIDER_SITE_OTHER): Payer: Federal, State, Local not specified - PPO | Admitting: Internal Medicine

## 2012-01-01 VITALS — BP 126/82 | HR 68 | Temp 98.6°F | Ht 68.5 in | Wt 195.0 lb

## 2012-01-01 DIAGNOSIS — Z0000 Encounter for general adult medical examination without abnormal findings: Secondary | ICD-10-CM

## 2012-01-01 DIAGNOSIS — E785 Hyperlipidemia, unspecified: Secondary | ICD-10-CM

## 2012-01-01 DIAGNOSIS — J310 Chronic rhinitis: Secondary | ICD-10-CM

## 2012-01-01 NOTE — Patient Instructions (Signed)
Continue lifestyle intervention healthy eating and exercise . You are doing well.

## 2012-01-01 NOTE — Progress Notes (Signed)
Subjective:    Patient ID: Randall Torres, male    DOB: 07-07-1951, 61 y.o.   MRN: [id number]  HPI Patient comes in today for preventive visit and follow-up of medical issues. Update  history since  last visit: Doing well good day s and bad days . Exercising and  Trying to eat healthy. Sleep good. Goes to AA frequently . Alcohol free. Out of counseling and off meds now and doing well  Review of Systems ROS:  GEN/ HEENT: No fever, significant weight changes sweats headaches vision problems  Slight decrease in hearing CV/ PULM; No chest pain shortness of breath cough, syncope,edema  change in exercise tolerance. GI /GU: No adominal pain, vomiting, change in bowel habits. No blood in the stool. No significant GU symptoms. SKIN/HEME: ,no acute skin rashes suspicious lesions or bleeding. No lymphadenopathy, nodules, masses.  NEURO/ PSYCH:  No neurologic signs such as weakness numbness. No depression anxiety. IMM/ Allergy: No unusual infections.  Allergy .   Controlled with zyrtec  REST of 12 system review negative except as per HPI  Past history family history social history reviewed in the electronic medical record. Outpatient Prescriptions Prior to Visit  Medication Sig Dispense Refill  . fish oil-omega-3 fatty acids 1000 MG capsule Take 1 g by mouth daily.      . Multiple Vitamin (MULTIVITAMIN) tablet Take 1 tablet by mouth daily.      . simvastatin (ZOCOR) 40 MG tablet TAKE 1 TABLET BY MOUTH ONCE DAILY  30 tablet  5  . fexofenadine-pseudoephedrine (ALLEGRA-D 24) 180-240 MG per 24 hr tablet Take 1 tablet by mouth daily.  30 tablet  2  . fluticasone (FLONASE) 50 MCG/ACT nasal spray 2 sprays by Nasal route daily.  16 g  12        Objective:   Physical Exam Physical Exam: \BP 126/82  Pulse 68  Temp(Src) 98.6 F (37 C) (Oral)  Ht 5' 8.5" (1.74 m)  Wt 195 lb (88.451 kg)  BMI 29.22 kg/m2  SpO2 98%  Vital signs reviewed WUJ:WJXB is a well-developed well-nourished alert  cooperative  White male  who appears   stated age in no acute distress.  HEENT: normocephalic  traumatic , Eyes: PERRL EOM's full, conjunctiva clear, Nares: patent no deformity discharge or tenderness., Ears: no deformity EAC's clear TMs with normal landmarks. Mouth: clear OP, no lesions, edema.  Moist mucous membranes. Dentition in adequate repair. NECK: supple without masses, thyromegaly or bruits. CHEST/PULM:  Clear to auscultation and percussion breath sounds equal no wheeze , rales or rhonchi. No chest wall deformities or tenderness. CV: PMI is nondisplaced, S1 S2 no gallops, murmurs, rubs. Peripheral pulses are full without delay.No JVD .  ABDOMEN: Bowel sounds normal nontender  No guard or rebound, no hepato splenomegal no CVA tenderness.  No hernia. Extremtities:  No clubbing cyanosis or edema, no acute joint swelling or redness no focal atrophy NEURO:  Oriented x3, cranial nerves 3-12 appear to be intact, no obvious focal weakness,gait within normal limits no abnormal reflexes or asymmetrical SKIN: No acute rashes normal turgor, color, no bruising or petechiae. PSYCH: Oriented, good eye contact, no obvious depression anxiety, cognition and judgment appear normal. LN:  No cervical axillary or inguinal adenopathy Rectal prostate 1+ no nodules       Lab Results  Component Value Date   WBC 5.7 12/25/2011   HGB 14.2 12/25/2011   HCT 42.7 12/25/2011   PLT 251.0 12/25/2011   GLUCOSE 85 12/25/2011   CHOL 160  12/25/2011   TRIG 34.0 12/25/2011   HDL 52.90 12/25/2011   LDLDIRECT 147.2 11/29/2009   LDLCALC 100* 12/25/2011   ALT 27 12/25/2011   AST 30 12/25/2011   NA 139 12/25/2011   K 5.5* 12/25/2011   CL 102 12/25/2011   CREATININE 0.9 12/25/2011   BUN 21 12/25/2011   CO2 27 12/25/2011   TSH 1.71 12/25/2011   PSA 0.83 12/25/2011   HGBA1C 5.8 12/30/2007        Assessment & Plan:  Preventive Health Care Counseled regarding healthy nutrition, exercise, sleep, injury prevention, calcium vit d and  healthy weight . Some slight decrease in hearing.   PV in a year or as needed.  LIPIDS  Better  Cont meds and lsi potassium is prob lab  Specimen artifact  No K supplements BG is good.l

## 2012-05-14 ENCOUNTER — Other Ambulatory Visit: Payer: Self-pay | Admitting: Internal Medicine

## 2012-12-24 ENCOUNTER — Other Ambulatory Visit (INDEPENDENT_AMBULATORY_CARE_PROVIDER_SITE_OTHER): Payer: Federal, State, Local not specified - PPO

## 2012-12-24 DIAGNOSIS — Z0000 Encounter for general adult medical examination without abnormal findings: Secondary | ICD-10-CM

## 2012-12-24 LAB — CBC WITH DIFFERENTIAL/PLATELET
Basophils Relative: 0.8 % (ref 0.0–3.0)
Eosinophils Absolute: 0.3 10*3/uL (ref 0.0–0.7)
Eosinophils Relative: 5.1 % — ABNORMAL HIGH (ref 0.0–5.0)
HCT: 42 % (ref 39.0–52.0)
Lymphs Abs: 2.3 10*3/uL (ref 0.7–4.0)
MCHC: 33.3 g/dL (ref 30.0–36.0)
MCV: 89.1 fl (ref 78.0–100.0)
Monocytes Absolute: 0.4 10*3/uL (ref 0.1–1.0)
Neutrophils Relative %: 46.5 % (ref 43.0–77.0)
RBC: 4.71 Mil/uL (ref 4.22–5.81)

## 2012-12-24 LAB — LIPID PANEL
LDL Cholesterol: 113 mg/dL — ABNORMAL HIGH (ref 0–99)
Total CHOL/HDL Ratio: 4
VLDL: 9.6 mg/dL (ref 0.0–40.0)

## 2012-12-24 LAB — HEPATIC FUNCTION PANEL
ALT: 22 U/L (ref 0–53)
Albumin: 4.1 g/dL (ref 3.5–5.2)
Bilirubin, Direct: 0.1 mg/dL (ref 0.0–0.3)
Total Protein: 7.4 g/dL (ref 6.0–8.3)

## 2012-12-24 LAB — BASIC METABOLIC PANEL
BUN: 23 mg/dL (ref 6–23)
CO2: 29 mEq/L (ref 19–32)
Chloride: 106 mEq/L (ref 96–112)
Creatinine, Ser: 0.8 mg/dL (ref 0.4–1.5)
Potassium: 4.8 mEq/L (ref 3.5–5.1)

## 2012-12-24 LAB — TSH: TSH: 2.28 u[IU]/mL (ref 0.35–5.50)

## 2012-12-24 LAB — PSA: PSA: 0.91 ng/mL (ref 0.10–4.00)

## 2013-01-01 ENCOUNTER — Encounter: Payer: Federal, State, Local not specified - PPO | Admitting: Internal Medicine

## 2013-01-08 ENCOUNTER — Ambulatory Visit (INDEPENDENT_AMBULATORY_CARE_PROVIDER_SITE_OTHER): Payer: Federal, State, Local not specified - PPO | Admitting: Internal Medicine

## 2013-01-08 ENCOUNTER — Encounter: Payer: Self-pay | Admitting: Internal Medicine

## 2013-01-08 VITALS — BP 126/82 | HR 60 | Temp 98.2°F | Ht 68.5 in | Wt 191.0 lb

## 2013-01-08 DIAGNOSIS — N400 Enlarged prostate without lower urinary tract symptoms: Secondary | ICD-10-CM

## 2013-01-08 DIAGNOSIS — R7309 Other abnormal glucose: Secondary | ICD-10-CM

## 2013-01-08 DIAGNOSIS — Z0000 Encounter for general adult medical examination without abnormal findings: Secondary | ICD-10-CM

## 2013-01-08 DIAGNOSIS — F988 Other specified behavioral and emotional disorders with onset usually occurring in childhood and adolescence: Secondary | ICD-10-CM

## 2013-01-08 DIAGNOSIS — E785 Hyperlipidemia, unspecified: Secondary | ICD-10-CM

## 2013-01-08 NOTE — Patient Instructions (Addendum)
Continue lifestyle intervention healthy eating and exercise .  Preventive visit in a year  Or as needed.   Wt Readings from Last 3 Encounters:  01/08/13 191 lb (86.637 kg)  01/01/12 195 lb (88.451 kg)  10/11/11 203 lb (92.08 kg)

## 2013-01-08 NOTE — Progress Notes (Signed)
Chief Complaint  Patient presents with  . Annual Exam    HPI: Patient comes in today for Preventive Health Care visit  No major change in health status since last visit . lifestyle intervention healthy eating and exercise . Beginning to walk more with FIT BIT no problem  with meds  BPH the same  Looking at saw palmetto    ROS:  GEN/ HEENT: No fever, significant weight changes sweats headaches vision problems hearing changes, CV/ PULM; No chest pain shortness of breath cough, syncope,edema  change in exercise tolerance. GI /GU: No adominal pain, vomiting, change in bowel habits. No blood in the stool. No significant GU symptoms. SKIN/HEME: ,no acute skin rashes suspicious lesions or bleeding. No lymphadenopathy, nodules, masses.  NEURO/ PSYCH:  No neurologic signs such as weakness numbness. No depression anxiety. IMM/ Allergy: No unusual infections.  Allergy .   REST of 12 system review negative except as per HPI   Past Medical History  Diagnosis Date  . Hyperlipidemia   . ADD (attention deficit disorder)   . BPH (benign prostatic hyperplasia)     has seen urologist  . Depression     ETOH related hosp 2010  . DEPRESSIVE DISORDER NOT ELSEWHERE CLASSIFIED 04/20/2009    Qualifier: Diagnosis of  By: Fabian Sharp MD, Neta Mends   . ADJUSTMENT DISORDER 09/29/2007    Qualifier: Diagnosis of  By: Fabian Sharp MD, Neta Mends   . Rhinitis 12/31/2010  . FASTING HYPERGLYCEMIA 09/29/2007    Qualifier: Diagnosis of  By: Fabian Sharp MD, Neta Mends     Family History  Problem Relation Age of Onset  . Diabetes    . Other      CVA age 47  . Other      sleep problems  . Myasthenia gravis Brother     deceased  . Hyperlipidemia    . Alcohol abuse      Uncle    History   Social History  . Marital Status: Married    Spouse Name: N/A    Number of Children: N/A  . Years of Education: N/A   Social History Main Topics  . Smoking status: Never Smoker   . Smokeless tobacco: None  . Alcohol Use:      Comment: Hx of  alcohol  . Drug Use:   . Sexually Active:    Other Topics Concern  . None   Social History Narrative   Occupation: Publishing rights manager.   Married   Exercise: Swims daily  5-6 days per week.    Goes to AA  5 day per week or so.      HH of 2 dog     Wife recent dx and rx for breast cancer     Outpatient Encounter Prescriptions as of 01/08/2013  Medication Sig Dispense Refill  . Multiple Vitamin (MULTIVITAMIN) tablet Take 1 tablet by mouth daily.      . NON FORMULARY Digestive advantage      . Saw Palmetto, Serenoa repens, (SAW PALMETTO PO) Take by mouth.      . simvastatin (ZOCOR) 40 MG tablet take 1 tablet by mouth once daily  30 tablet  7  . [DISCONTINUED] fish oil-omega-3 fatty acids 1000 MG capsule Take 1 g by mouth daily.      . [DISCONTINUED] fluticasone (FLONASE) 50 MCG/ACT nasal spray 2 sprays by Nasal route daily.  16 g  12   No facility-administered encounter medications on file as of 01/08/2013.    EXAM:  BP  126/82  Pulse 60  Temp(Src) 98.2 F (36.8 C) (Oral)  Ht 5' 8.5" (1.74 m)  Wt 191 lb (86.637 kg)  BMI 28.62 kg/m2  SpO2 98%  Body mass index is 28.62 kg/(m^2).  Physical Exam: Vital signs reviewed ZOX:WRUE is a well-developed well-nourished alert cooperative   male who appears  stated age in no acute distress.  HEENT: normocephalic atraumatic , Eyes: PERRL EOM's full, conjunctiva clear, Nares: paten,t no deformity discharge or tenderness., Ears: no deformity EAC's clear TMs with normal landmarks. Mouth: clear OP, no lesions, edema.  Moist mucous membranes. Dentition in adequate repair. NECK: supple without masses, thyromegaly or bruits. CHEST/PULM:  Clear to auscultation and percussion breath sounds equal no wheeze , rales or rhonchi. No chest wall deformities or tenderness. CV: PMI is nondisplaced, S1 S2 no gallops, murmurs, rubs. Peripheral pulses are full without delay.No JVD .  ABDOMEN: Bowel sounds normal nontender  No guard or rebound, no hepato  splenomegal no CVA tenderness.  No hernia. Extremtities:  No clubbing cyanosis or edema, no acute joint swelling or redness no focal atrophy NEURO:  Oriented x3, cranial nerves 3-12 appear to be intact, no obvious focal weakness,gait within normal limits no abnormal reflexes or asymmetrical SKIN: No acute rashes normal turgor, color, no bruising or petechiae.  PSYCH: Oriented, good eye contact, no obvious depression anxiety, cognition and judgment appear normal. LN: no cervical axillary inguinal adenopathy Rectal prostate 2+ no nodules   Lab Results  Component Value Date   WBC 5.8 12/24/2012   HGB 14.0 12/24/2012   HCT 42.0 12/24/2012   PLT 265.0 12/24/2012   GLUCOSE 86 12/24/2012   CHOL 164 12/24/2012   TRIG 48.0 12/24/2012   HDL 41.00 12/24/2012   LDLDIRECT 147.2 11/29/2009   LDLCALC 113* 12/24/2012   ALT 22 12/24/2012   AST 29 12/24/2012   NA 142 12/24/2012   K 4.8 12/24/2012   CL 106 12/24/2012   CREATININE 0.8 12/24/2012   BUN 23 12/24/2012   CO2 29 12/24/2012   TSH 2.28 12/24/2012   PSA 0.91 12/24/2012   HGBA1C 5.8 12/30/2007    ASSESSMENT AND PLAN:  Discussed the following assessment and plan:  Visit for preventive health examination - UTD  HYPERLIPIDEMIA  FASTING HYPERGLYCEMIA - resolved   ADD  BPH (benign prostatic hyperplasia) Counseled regarding healthy nutrition, exercise, sleep, injury prevention, calcium vit d and healthy weight . Continue lifestyle intervention healthy eating and exercise .  Patient Care Team: Madelin Headings, MD as PCP - General Lindaann Slough, MD as Attending Physician (Urology) Patient Instructions   Continue lifestyle intervention healthy eating and exercise .  Preventive visit in a year  Or as needed.   Wt Readings from Last 3 Encounters:  01/08/13 191 lb (86.637 kg)  01/01/12 195 lb (88.451 kg)  10/11/11 203 lb (92.08 kg)      Burna Mortimer K. Panosh M.D.  Health Maintenance  Topic Date Due  . Influenza Vaccine  05/17/2013  .  Tetanus/tdap  09/10/2016  . Colonoscopy  06/30/2018  . Zostavax  Completed   Health Maintenance Review

## 2013-02-23 ENCOUNTER — Other Ambulatory Visit: Payer: Self-pay | Admitting: Internal Medicine

## 2013-06-28 ENCOUNTER — Encounter: Payer: Self-pay | Admitting: Internal Medicine

## 2013-06-30 ENCOUNTER — Encounter: Payer: Self-pay | Admitting: Internal Medicine

## 2013-07-07 ENCOUNTER — Encounter: Payer: Self-pay | Admitting: Internal Medicine

## 2013-07-08 ENCOUNTER — Encounter: Payer: Self-pay | Admitting: Internal Medicine

## 2013-07-08 ENCOUNTER — Ambulatory Visit (INDEPENDENT_AMBULATORY_CARE_PROVIDER_SITE_OTHER): Payer: Federal, State, Local not specified - PPO | Admitting: Internal Medicine

## 2013-07-08 VITALS — BP 120/82 | HR 71 | Temp 98.7°F | Wt 200.0 lb

## 2013-07-08 DIAGNOSIS — J329 Chronic sinusitis, unspecified: Secondary | ICD-10-CM

## 2013-07-08 DIAGNOSIS — J029 Acute pharyngitis, unspecified: Secondary | ICD-10-CM

## 2013-07-08 LAB — POCT RAPID STREP A (OFFICE): Rapid Strep A Screen: NEGATIVE

## 2013-07-08 MED ORDER — AMOXICILLIN 500 MG PO CAPS: 500.0000 mg | ORAL_CAPSULE | Freq: Three times a day (TID) | ORAL | Status: DC

## 2013-07-08 NOTE — Patient Instructions (Signed)
This could be partly treated. sinusitis. Take med as directed .

## 2013-07-08 NOTE — Progress Notes (Signed)
Chief Complaint  Patient presents with  . Cough    sore throat, fatigue, body aches x 10 days   . Sore Throat    HPI: Patient comes in today for SDA for  new problem evaluation. Very bad sore throat  For 10 days hard to sleep and swallow   Hx of same in past .  Some nasal congestion  Drip cough/ no strep exposures .      Took amox  Left over from dental proceeduire250. Some last week and night.  No fever   Body aches ? If fever or not.  ROS: See pertinent positives and negatives per HPI.no rashes     Past Medical History  Diagnosis Date  . Hyperlipidemia   . ADD (attention deficit disorder)   . BPH (benign prostatic hyperplasia)     has seen urologist  . Depression     ETOH related hosp 2010  . DEPRESSIVE DISORDER NOT ELSEWHERE CLASSIFIED 04/20/2009    Qualifier: Diagnosis of  By: Fabian Sharp MD, Neta Mends   . ADJUSTMENT DISORDER 09/29/2007    Qualifier: Diagnosis of  By: Fabian Sharp MD, Neta Mends   . Rhinitis 12/31/2010  . FASTING HYPERGLYCEMIA 09/29/2007    Qualifier: Diagnosis of  By: Fabian Sharp MD, Neta Mends   . Personal history of colonic adenoma 06/30/2008    Family History  Problem Relation Age of Onset  . Diabetes    . Other      CVA age 60  . Other      sleep problems  . Myasthenia gravis Brother     deceased  . Hyperlipidemia    . Alcohol abuse      Uncle    History   Social History  . Marital Status: Married    Spouse Name: N/A    Number of Children: N/A  . Years of Education: N/A   Social History Main Topics  . Smoking status: Never Smoker   . Smokeless tobacco: None  . Alcohol Use:      Comment: Hx of alcohol  . Drug Use:   . Sexual Activity:    Other Topics Concern  . None   Social History Narrative   Occupation: Publishing rights manager.   Married   Exercise: Swims daily  5-6 days per week.    Goes to AA  5 day per week or so.      HH of 2 dog     Wife recent dx and rx for breast cancer     Outpatient Encounter Prescriptions as of 07/08/2013  Medication Sig  Dispense Refill  . Multiple Vitamin (MULTIVITAMIN) tablet Take 1 tablet by mouth daily.      . NON FORMULARY Digestive advantage      . Saw Palmetto, Serenoa repens, (SAW PALMETTO PO) Take by mouth.      . simvastatin (ZOCOR) 40 MG tablet take 1 tablet by mouth once daily  30 tablet  5  . amoxicillin (AMOXIL) 500 MG capsule Take 1 capsule (500 mg total) by mouth 3 (three) times daily.  30 capsule  0   No facility-administered encounter medications on file as of 07/08/2013.    EXAM:  BP 120/82  Pulse 71  Temp(Src) 98.7 F (37.1 C) (Oral)  Wt 200 lb (90.719 kg)  BMI 29.96 kg/m2  SpO2 98%  Body mass index is 29.96 kg/(m^2).  WDWN in NAD  quiet respirations; dNon toxic . HEENT: Normocephalic ;atraumatic , Eyes;  PERRL, EOMs  Full, lids and conjunctiva clear,,Ears:  no deformities, canals nl, TM landmarks normal, Nose: no deformity or discharge but congested; discharge face non tender Mouth : OP clear without lesion or edema . 1 redness no drainage cobblestoning  Neck: Supple without adenopathy or masses or bruits tender ac area  Chest:  Clear to A&P without wheezes rales or rhonchi CV:  S1-S2 no gallops or murmurs peripheral perfusion is normal Skin :nl perfusion and no acute rashes  PSYCH: pleasant and cooperative, no obvious depression or anxiety rs neg tcx pending ASSESSMENT AND PLAN:  Discussed the following assessment and plan:  Sore throat - Plan: POC Rapid Strep A, Culture, Group A Strep  Sinusitis - possible  cause of st   partly rx ?  with ocass amox  -Patient advised to return or notify health care team  if symptoms worsen or persist or new concerns arise.  Patient Instructions  This could be partly treated. sinusitis. Take med as directed .    Neta Mends. Panosh M.D.

## 2013-07-10 LAB — CULTURE, GROUP A STREP: Organism ID, Bacteria: NORMAL

## 2013-07-14 ENCOUNTER — Encounter: Payer: Self-pay | Admitting: Family Medicine

## 2013-07-30 ENCOUNTER — Encounter: Payer: Self-pay | Admitting: Internal Medicine

## 2013-09-12 ENCOUNTER — Other Ambulatory Visit: Payer: Self-pay | Admitting: Internal Medicine

## 2013-09-22 ENCOUNTER — Encounter: Payer: Federal, State, Local not specified - PPO | Admitting: Internal Medicine

## 2013-12-10 ENCOUNTER — Encounter: Payer: Self-pay | Admitting: Internal Medicine

## 2013-12-30 ENCOUNTER — Telehealth: Payer: Self-pay | Admitting: Internal Medicine

## 2013-12-30 NOTE — Telephone Encounter (Signed)
Pt is traveling to [place] and would like to know if he need a TETANUS shot

## 2013-12-31 NOTE — Telephone Encounter (Signed)
Patient does not need a tetanus.  However, he may wish to get his pneumonia booster (Prevnar).  If so, please place him on my schedule. Thanks!

## 2014-01-03 ENCOUNTER — Other Ambulatory Visit: Payer: Federal, State, Local not specified - PPO

## 2014-01-10 ENCOUNTER — Encounter: Payer: Federal, State, Local not specified - PPO | Admitting: Internal Medicine

## 2014-01-11 ENCOUNTER — Ambulatory Visit: Payer: Federal, State, Local not specified - PPO | Admitting: Family Medicine

## 2014-01-11 ENCOUNTER — Ambulatory Visit (INDEPENDENT_AMBULATORY_CARE_PROVIDER_SITE_OTHER): Payer: Federal, State, Local not specified - PPO | Admitting: Family Medicine

## 2014-01-11 DIAGNOSIS — Z23 Encounter for immunization: Secondary | ICD-10-CM

## 2014-02-11 ENCOUNTER — Other Ambulatory Visit: Payer: Self-pay | Admitting: Internal Medicine

## 2014-02-11 NOTE — Telephone Encounter (Signed)
Sent by e-scribe.  Filled until upcoming CPE on 05/10/14

## 2014-05-03 ENCOUNTER — Other Ambulatory Visit (INDEPENDENT_AMBULATORY_CARE_PROVIDER_SITE_OTHER): Payer: Federal, State, Local not specified - PPO

## 2014-05-03 DIAGNOSIS — Z0000 Encounter for general adult medical examination without abnormal findings: Secondary | ICD-10-CM

## 2014-05-03 LAB — CBC WITH DIFFERENTIAL/PLATELET
BASOS ABS: 0.1 10*3/uL (ref 0.0–0.1)
Basophils Relative: 0.8 % (ref 0.0–3.0)
Eosinophils Absolute: 0.4 10*3/uL (ref 0.0–0.7)
Eosinophils Relative: 6.3 % — ABNORMAL HIGH (ref 0.0–5.0)
HCT: 42.6 % (ref 39.0–52.0)
Hemoglobin: 14.3 g/dL (ref 13.0–17.0)
LYMPHS ABS: 2.4 10*3/uL (ref 0.7–4.0)
Lymphocytes Relative: 35.3 % (ref 12.0–46.0)
MCHC: 33.6 g/dL (ref 30.0–36.0)
MCV: 89.1 fl (ref 78.0–100.0)
MONO ABS: 0.5 10*3/uL (ref 0.1–1.0)
Monocytes Relative: 6.8 % (ref 3.0–12.0)
Neutro Abs: 3.4 10*3/uL (ref 1.4–7.7)
Neutrophils Relative %: 50.8 % (ref 43.0–77.0)
PLATELETS: 271 10*3/uL (ref 150.0–400.0)
RBC: 4.78 Mil/uL (ref 4.22–5.81)
RDW: 13.1 % (ref 11.5–15.5)
WBC: 6.7 10*3/uL (ref 4.0–10.5)

## 2014-05-03 LAB — COMPREHENSIVE METABOLIC PANEL
ALBUMIN: 3.9 g/dL (ref 3.5–5.2)
ALK PHOS: 56 U/L (ref 39–117)
ALT: 28 U/L (ref 0–53)
AST: 31 U/L (ref 0–37)
BILIRUBIN TOTAL: 1 mg/dL (ref 0.2–1.2)
BUN: 19 mg/dL (ref 6–23)
CO2: 28 mEq/L (ref 19–32)
Calcium: 9.1 mg/dL (ref 8.4–10.5)
Chloride: 104 mEq/L (ref 96–112)
Creatinine, Ser: 0.9 mg/dL (ref 0.4–1.5)
GFR: 96.22 mL/min (ref >60.00)
Glucose, Bld: 89 mg/dL (ref 70–99)
Potassium: 4.4 mEq/L (ref 3.5–5.1)
SODIUM: 140 meq/L (ref 135–145)
Total Protein: 7.4 g/dL (ref 6.0–8.3)

## 2014-05-03 LAB — LIPID PANEL
Cholesterol: 182 mg/dL (ref 0–200)
HDL: 46 mg/dL (ref >39.00)
LDL CALC: 121 mg/dL — AB (ref 0–99)
NONHDL: 136
TRIGLYCERIDES: 77 mg/dL (ref 0.0–149.0)
Total CHOL/HDL Ratio: 4
VLDL: 15.4 mg/dL (ref 0.0–40.0)

## 2014-05-03 LAB — TSH: TSH: 2.79 u[IU]/mL (ref 0.35–4.50)

## 2014-05-03 LAB — BASIC METABOLIC PANEL
BUN: 19 mg/dL (ref 6–23)
CHLORIDE: 104 meq/L (ref 96–112)
CO2: 28 mEq/L (ref 19–32)
Calcium: 9.1 mg/dL (ref 8.4–10.5)
Creatinine, Ser: 0.9 mg/dL (ref 0.4–1.5)
GFR: 96.22 mL/min (ref >60.00)
Glucose, Bld: 89 mg/dL (ref 70–99)
POTASSIUM: 4.4 meq/L (ref 3.5–5.1)
Sodium: 140 mEq/L (ref 135–145)

## 2014-05-03 LAB — PSA: PSA: 0.95 ng/mL (ref 0.10–4.00)

## 2014-05-10 ENCOUNTER — Ambulatory Visit (INDEPENDENT_AMBULATORY_CARE_PROVIDER_SITE_OTHER): Payer: Federal, State, Local not specified - PPO | Admitting: Internal Medicine

## 2014-05-10 ENCOUNTER — Encounter: Payer: Self-pay | Admitting: Internal Medicine

## 2014-05-10 VITALS — BP 130/88 | Temp 98.5°F | Ht 68.5 in | Wt 194.0 lb

## 2014-05-10 DIAGNOSIS — E785 Hyperlipidemia, unspecified: Secondary | ICD-10-CM

## 2014-05-10 DIAGNOSIS — Z0000 Encounter for general adult medical examination without abnormal findings: Secondary | ICD-10-CM

## 2014-05-10 DIAGNOSIS — Z23 Encounter for immunization: Secondary | ICD-10-CM

## 2014-05-10 DIAGNOSIS — N400 Enlarged prostate without lower urinary tract symptoms: Secondary | ICD-10-CM

## 2014-05-10 NOTE — Patient Instructions (Addendum)
Continue lifestyle intervention healthy eating and exercise .   Healthy lifestyle includes : At least 150 minutes of exercise weeks  , weight at healthy levels, which is usually   BMI 19-25. Avoid trans fats and processed foods;  Increase fresh fruits and veges to 5 servings per day. And avoid sweet beverages including tea and juice. Mediterranean diet with olive oil and nuts have been noted to be heart and brain healthy . Avoid tobacco products . Continue no etoh for you Get adequate sleep . Check bp ocassionally to make sure lower than  140/90

## 2014-05-10 NOTE — Progress Notes (Signed)
Pre visit review using our clinic review tool, if applicable. No additional management support is needed unless otherwise documented below in the visit note.  Chief Complaint  Patient presents with  . Annual Exam    HPI: Patient comes in today for Preventive Health Care visit  No major change in health status since last visit . bph about the same  Not going back to uro procedures and meds never helped no new sx  LIPIDS taking med nose  Health Maintenance  Topic Date Due  . Influenza Vaccine  04/17/2015  . Tetanus/tdap  09/10/2016  . Colonoscopy  07/30/2023  . Zostavax  Completed   Health Maintenance Review LIFESTYLE:  Exercise:  Walking mile and swim still  Tobacco/ETS:  No  Alcohol: none Sugar beverages: coke once a day and honey in tea.  Sleep:  Ok good  Drug use: no:  Colonoscopy: 2014   ROS:  GEN/ HEENT: No fever, significant weight changes sweats headaches vision problems hearing changes, CV/ PULM; No chest pain shortness of breath cough, syncope,edema  change in exercise tolerance. GI /GU: No adominal pain, vomiting, change in bowel habits. No blood in the stool. No significant GU symptoms. SKIN/HEME: ,no acute skin rashes suspicious lesions or bleeding. No lymphadenopathy, nodules, masses.  NEURO/ PSYCH:  No neurologic signs such as weakness numbness. No depression anxiety. IMM/ Allergy: No unusual infections.  Allergy .   REST of 12 system review negative except as per HPI   Past Medical History  Diagnosis Date  . Hyperlipidemia   . ADD (attention deficit disorder)   . BPH (benign prostatic hyperplasia)     has seen urologist  . Depression     ETOH related hosp 2010  . DEPRESSIVE DISORDER NOT ELSEWHERE CLASSIFIED 04/20/2009    Qualifier: Diagnosis of  By: Fabian Sharp MD, Neta Mends   . ADJUSTMENT DISORDER 09/29/2007    Qualifier: Diagnosis of  By: Fabian Sharp MD, Neta Mends   . Rhinitis 12/31/2010  . FASTING HYPERGLYCEMIA 09/29/2007    Qualifier: Diagnosis of  By: Fabian Sharp MD,  Neta Mends   . Personal history of colonic adenoma 06/30/2008    Family History  Problem Relation Age of Onset  . Diabetes    . Other      CVA age 88  . Other      sleep problems  . Myasthenia gravis Brother     deceased  . Hyperlipidemia    . Alcohol abuse      Uncle    History   Social History  . Marital Status: Married    Spouse Name: N/A    Number of Children: N/A  . Years of Education: N/A   Social History Main Topics  . Smoking status: Never Smoker   . Smokeless tobacco: None  . Alcohol Use:      Comment: Hx of alcohol  . Drug Use:   . Sexual Activity:    Other Topics Concern  . None   Social History Narrative   Occupation: Publishing rights manager.   Married   Exercise: Swims daily  5-6 days per week.    Goes to AA  5 day per week or so.      HH of 2 dog     Wife recent dx and rx for breast cancer     Outpatient Encounter Prescriptions as of 05/10/2014  Medication Sig  . NON FORMULARY Digestive advantage  . simvastatin (ZOCOR) 40 MG tablet take 1 tablet by mouth once daily  . [  DISCONTINUED] amoxicillin (AMOXIL) 500 MG capsule Take 1 capsule (500 mg total) by mouth 3 (three) times daily.  . [DISCONTINUED] Multiple Vitamin (MULTIVITAMIN) tablet Take 1 tablet by mouth daily.  . [DISCONTINUED] Saw Palmetto, Serenoa repens, (SAW PALMETTO PO) Take by mouth.    EXAM:  BP 130/88  Temp(Src) 98.5 F (36.9 C) (Oral)  Ht 5' 8.5" (1.74 m)  Wt 194 lb (87.998 kg)  BMI 29.07 kg/m2  Body mass index is 29.07 kg/(m^2).  Physical Exam: Vital signs reviewed ZOX:WRUE is a well-developed well-nourished alert cooperative    who appearsr stated age in no acute distress.  HEENT: normocephalic atraumatic , Eyes: PERRL EOM's full, conjunctiva clear, Nares: paten,t no deformity discharge or tenderness., Ears: no deformity EAC's clear TMs with normal landmarks. Mouth: clear OP, no lesions, edema.  Moist mucous membranes. Dentition in adequate repair. NECK: supple without masses,  thyromegaly or bruits. CHEST/PULM:  Clear to auscultation and percussion breath sounds equal no wheeze , rales or rhonchi. No chest wall deformities or tenderness. CV: PMI is nondisplaced, S1 S2 no gallops, murmurs, rubs. Peripheral pulses are full without delay.No JVD .  ABDOMEN: Bowel sounds normal nontender  No guard or rebound, no hepato splenomegal no CVA tenderness.  No hernia. Extremtities:  No clubbing cyanosis or edema, no acute joint swelling or redness no focal atrophy NEURO:  Oriented x3, cranial nerves 3-12 appear to be intact, no obvious focal weakness,gait within normal limits no abnormal reflexes or asymmetrical Rectal prostate 2+ np nodules of bogginess SKIN: No acute rashes normal turgor, color, no bruising or petechiae. PSYCH: Oriented, good eye contact, no obvious depression anxiety, cognition and judgment appear normal. LN: no cervical axillary inguinal adenopathy  Lab Results  Component Value Date   WBC 6.7 05/03/2014   HGB 14.3 05/03/2014   HCT 42.6 05/03/2014   PLT 271.0 05/03/2014   GLUCOSE 89 05/03/2014   GLUCOSE 89 05/03/2014   CHOL 182 05/03/2014   TRIG 77.0 05/03/2014   HDL 46.00 05/03/2014   LDLDIRECT 147.2 11/29/2009   LDLCALC 121* 05/03/2014   ALT 28 05/03/2014   AST 31 05/03/2014   NA 140 05/03/2014   NA 140 05/03/2014   K 4.4 05/03/2014   K 4.4 05/03/2014   CL 104 05/03/2014   CL 104 05/03/2014   CREATININE 0.9 05/03/2014   CREATININE 0.9 05/03/2014   BUN 19 05/03/2014   BUN 19 05/03/2014   CO2 28 05/03/2014   CO2 28 05/03/2014   TSH 2.79 05/03/2014   PSA 0.95 05/03/2014   HGBA1C 5.8 12/30/2007    ASSESSMENT AND PLAN:  Discussed the following assessment and plan:  Visit for preventive health examination  HYPERLIPIDEMIA - continue consider if risking to atorva generic   Need for prophylactic vaccination and inoculation against influenza - Plan: Flu Vaccine QUAD 36+ mos PF IM (Fluarix Quad PF)  BPH (benign prostatic hyperplasia) - stable  Follow bp readings   To ensure at goal  Patient Care Team: Madelin Headings, MD as PCP - General Danae Chen, MD as Attending Physician (Urology) Patient Instructions  Continue lifestyle intervention healthy eating and exercise .   Healthy lifestyle includes : At least 150 minutes of exercise weeks  , weight at healthy levels, which is usually   BMI 19-25. Avoid trans fats and processed foods;  Increase fresh fruits and veges to 5 servings per day. And avoid sweet beverages including tea and juice. Mediterranean diet with olive oil and nuts have been noted to be  heart and brain healthy . Avoid tobacco products . Continue no etoh for you Get adequate sleep . Check bp ocassionally to make sure lower than  140/90     Burna Mortimer K. Panosh M.D.

## 2014-05-30 ENCOUNTER — Other Ambulatory Visit: Payer: Self-pay | Admitting: Internal Medicine

## 2014-05-31 NOTE — Telephone Encounter (Signed)
Sent to the pharmacy by e-scribe. 

## 2014-08-23 ENCOUNTER — Ambulatory Visit (INDEPENDENT_AMBULATORY_CARE_PROVIDER_SITE_OTHER): Payer: Federal, State, Local not specified - PPO | Admitting: Family Medicine

## 2014-08-23 ENCOUNTER — Encounter: Payer: Self-pay | Admitting: Family Medicine

## 2014-08-23 VITALS — BP 138/92 | HR 66 | Temp 97.7°F | Ht 68.5 in | Wt 195.9 lb

## 2014-08-23 DIAGNOSIS — R1031 Right lower quadrant pain: Secondary | ICD-10-CM

## 2014-08-23 DIAGNOSIS — R102 Pelvic and perineal pain: Secondary | ICD-10-CM

## 2014-08-23 NOTE — Progress Notes (Addendum)
HPI:  Acute visit for:  ? Hernia: -first noticed 2-3 weeks ago  -reports small bulge in R lower pelvic area and a little discomfort - when lifts feels pain here intermittently -swims, walks, no heavy lifting -had minor urological procedures in the past, sees a urologist -Denies: sig pain, changes in bowel or urination, fevers, malaise  ROS: See pertinent positives and negatives per HPI.  Past Medical History  Diagnosis Date  . Hyperlipidemia   . ADD (attention deficit disorder)   . BPH (benign prostatic hyperplasia)     has seen urologist  . Depression     ETOH related hosp 2010  . DEPRESSIVE DISORDER NOT ELSEWHERE CLASSIFIED 04/20/2009    Qualifier: Diagnosis of  By: Fabian SharpPanosh MD, Neta MendsWanda K   . ADJUSTMENT DISORDER 09/29/2007    Qualifier: Diagnosis of  By: Fabian SharpPanosh MD, Neta MendsWanda K   . Rhinitis 12/31/2010  . FASTING HYPERGLYCEMIA 09/29/2007    Qualifier: Diagnosis of  By: Fabian SharpPanosh MD, Neta MendsWanda K   . Personal history of colonic adenoma 06/30/2008    Past Surgical History  Procedure Laterality Date  . 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 Diabetes    . Other      CVA age 65  . Other      sleep problems  . Myasthenia gravis Brother     deceased  . Hyperlipidemia    . Alcohol abuse      Uncle    History   Social History  . Marital Status: Married    Spouse Name: N/A    Number of Children: N/A  . Years of Education: N/A   Social History Main Topics  . Smoking status: Never Smoker   . Smokeless tobacco: None  . Alcohol Use: None     Comment: Hx of alcohol  . Drug Use: None  . Sexual Activity: None   Other Topics Concern  . 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   Married   Exercise: Swims daily  5-6 days per week.    Goes to AA  5 day per week or so.      HH of 2 dog     Wife recent dx and rx for breast cancer     Current outpatient prescriptions: simvastatin (ZOCOR)  40 MG tablet, take 1 tablet by mouth once daily, Disp: 90 tablet, Rfl: 2  EXAM:  Filed Vitals:   08/23/14 0806  BP: 138/92  Pulse: 66  Temp: 97.7 F (36.5 C)    Body mass index is 29.35 kg/(m^2).  GENERAL: vitals reviewed and listed above, alert, oriented, appears well hydrated and in no acute distress  HEENT: atraumatic, conjunttiva clear, no obvious abnormalities on inspection of external nose and ears  NECK: no obvious masses on inspection  ABD/GU: abd with BS+, soft, NTTP, no rebound or guarding or appreciable mass in relaxed position, with valsalva - mild bulge appreciated in R lower pelvic region that is minimally TTP without definite appreciable defect in abdominal wall, normal GU exam  MS: moves all extremities without noticeable abnormality  PSYCH: pleasant and cooperative, no obvious depression or anxiety  ASSESSMENT AND PLAN:  Discussed the following assessment and plan:  Groin pain, right - Plan: Ambulatory referral to General Surgery  Pelvic pain in male - Plan: Ambulatory referral to General Surgery  -query small hernia or abd wall stretching given hx  and exam though no appreciable abd wall defect found on my exam today. We discussed options. He is interested in seeing a Careers advisersurgeon for further evaluation. Referral placed. Advised no strenuous lifting and return and emergency precautions. -Patient advised to return or notify a doctor immediately if symptoms worsen or persist or new concerns arise.  Patient Instructions  -We placed a referral for you as discussed to a general surgeon for evaluation for a possible hernia. It usually takes about 1-2 weeks to process and schedule this referral. If you have not heard from us regarding this appointment in 2 weeks please contact our office.      Kriste BasqueKIM, Salih Williamson R.

## 2014-08-23 NOTE — Patient Instructions (Signed)
-  We placed a referral for you as discussed to a general surgeon for evaluation for a possible hernia. It usually takes about 1-2 weeks to process and schedule this referral. If you have not heard from us regarding this appointment in 2 weeks please contact our office.

## 2014-08-23 NOTE — Progress Notes (Signed)
Pre visit review using our clinic review tool, if applicable. No additional management support is needed unless otherwise documented below in the visit note. 

## 2014-08-24 ENCOUNTER — Telehealth: Payer: Self-pay | Admitting: Internal Medicine

## 2014-08-24 DIAGNOSIS — R1900 Intra-abdominal and pelvic swelling, mass and lump, unspecified site: Secondary | ICD-10-CM

## 2014-08-24 NOTE — Telephone Encounter (Addendum)
A referral was place  For pt to R10.31 (ICD-10-CM) - Groin pain, right R10.2 (ICD-10-CM) - Pelvic pain in male Procedure: REF27 - AMB REFERRAL TO GENERAL SURGERY per [facility] Shanda Bumpsjessica , pt would need an Ultrasound done  to rule out this dx , can not schedule appt until pt have an ultrasound done , as need new referral  When its completed then they can schedule patient

## 2014-08-25 NOTE — Telephone Encounter (Signed)
US order placed. Please let patient know surgery office is requiring an US before seeing.

## 2014-08-25 NOTE — Telephone Encounter (Signed)
I called the pt and informed him of the message below and [facility] with the ultrasound appt.

## 2014-08-26 ENCOUNTER — Ambulatory Visit
Admission: RE | Admit: 2014-08-26 | Discharge: 2014-08-26 | Disposition: A | Discharge status: Home / Self Care | Payer: Federal, State, Local not specified - PPO | Source: Ambulatory Visit | Attending: Family Medicine | Admitting: Family Medicine

## 2014-08-26 ENCOUNTER — Other Ambulatory Visit: Payer: Self-pay | Admitting: Family Medicine

## 2014-08-26 DIAGNOSIS — R1900 Intra-abdominal and pelvic swelling, mass and lump, unspecified site: Secondary | ICD-10-CM

## 2014-08-30 ENCOUNTER — Encounter: Payer: Self-pay | Admitting: Internal Medicine

## 2014-08-30 ENCOUNTER — Ambulatory Visit (INDEPENDENT_AMBULATORY_CARE_PROVIDER_SITE_OTHER): Payer: Federal, State, Local not specified - PPO | Admitting: Internal Medicine

## 2014-08-30 VITALS — BP 148/82 | Temp 98.3°F | Ht 68.5 in | Wt 195.0 lb

## 2014-08-30 DIAGNOSIS — R1031 Right lower quadrant pain: Secondary | ICD-10-CM

## 2014-08-30 DIAGNOSIS — R1900 Intra-abdominal and pelvic swelling, mass and lump, unspecified site: Secondary | ICD-10-CM

## 2014-08-30 NOTE — Patient Instructions (Signed)
Will proceed with  Surgery consult    Even though neg us   Of the sx you are having and bulging at times.  Good know no masses  seen in that area .

## 2014-08-30 NOTE — Progress Notes (Signed)
Pre visit review using our clinic review tool, if applicable. No additional management support is needed unless otherwise documented below in the visit note.  Chief Complaint  Patient presents with  . Follow-up    HPI: Randall LoserMichael F Torres 63 y.o. comes in for fu of assesment for poss hernia groin pain discomfort  Here with wife ... seen by dr Selena BattenKim in my absence .   Has us done and was neg for neria seen or mass   [place]oreas done laying down and sx mostly when standing  Up for a while .  No systemic sx  Awareness of change in the last month  sorea at times but no severe pain .  Knows something is going on just cant tell what  .   What  is the next step  Since us is "normal" . No change in bowels continues to exercise  ROS: See pertinent positives and negatives per HPI.  Past Medical History  Diagnosis Date  . Hyperlipidemia   . ADD (attention deficit disorder)   . BPH (benign prostatic hyperplasia)     has seen urologist  . Depression     ETOH related hosp 2010  . DEPRESSIVE DISORDER NOT ELSEWHERE CLASSIFIED 04/20/2009    Qualifier: Diagnosis of  By: Fabian SharpPanosh MD, Neta MendsWanda K   . ADJUSTMENT DISORDER 09/29/2007    Qualifier: Diagnosis of  By: Fabian SharpPanosh MD, Neta MendsWanda K   . Rhinitis 12/31/2010  . FASTING HYPERGLYCEMIA 09/29/2007    Qualifier: Diagnosis of  By: Fabian SharpPanosh MD, Neta MendsWanda K   . Personal history of colonic adenoma 06/30/2008    Family History  Problem Relation Age of Onset  . Diabetes    . Other      CVA age 65  . Other      sleep problems  . Myasthenia gravis Brother     deceased  . Hyperlipidemia    . Alcohol abuse      Uncle    History   Social History  . Marital Status: Married    Spouse Name: N/A    Number of Children: N/A  . Years of Education: N/A   Social History Main Topics  . Smoking status: Never Smoker   . Smokeless tobacco: None  . Alcohol Use: None     Comment: Hx of alcohol  . Drug Use: None  . Sexual Activity: None   Other Topics Concern  . 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    Goes to AA  5 day per week or so.      HH of 2 dog     Wife recent dx and rx for breast cancer     Outpatient Encounter Prescriptions as of 08/30/2014  Medication Sig  . simvastatin (ZOCOR) 40 MG tablet take 1 tablet by mouth once daily    EXAM:  BP 148/82 mmHg  Temp(Src) 98.3 F (36.8 C) (Oral)  Ht 5' 8.5" (1.74 m)  Wt 195 lb (88.451 kg)  BMI 29.21 kg/m2  Body mass index is 29.21 kg/(m^2).  GENERAL: vitals reviewed and listed above, alert, oriented, appears well hydrated and in no acute distress PSYCH: pleasant and cooperative, no obvious depression or anxiety Reviewed visit  And reports  ASSESSMENT AND PLAN:  Discussed the following assessment and plan:  Abdominal wall bulge  Groin pain, right Check bp when at home to ensure at goal  Below  140/90  Will proceed with surgery exam anyway because of hx of intermittent bulging  And discomfort ? If advisability of ct or other eval .  Sx wax and wane  But persistent  Mostly when standing a while . This is a change   over the last month -Patient advised to return or notify health care team  if symptoms worsen ,persist or new concerns arise.  Patient Instructions  Will proceed with  Surgery consult    Even though neg us   Of the sx you are having and bulging at times.  Good know no masses  seen in that area .  Total visit 25mins > 50% spent counseling and coordinating care     BendWanda K. Panosh M.D.

## 2014-09-21 ENCOUNTER — Other Ambulatory Visit (INDEPENDENT_AMBULATORY_CARE_PROVIDER_SITE_OTHER): Payer: Self-pay | Admitting: General Surgery

## 2014-09-30 ENCOUNTER — Ambulatory Visit: Payer: Federal, State, Local not specified - PPO | Admitting: Internal Medicine

## 2014-10-13 NOTE — Patient Instructions (Addendum)
Bjorn LoserMichael F Putman  10/13/2014   Your procedure is scheduled on: 10/18/14   [facility] and follow signs to               Short Stay Center at 5:30 AM.   Call this number if you have problems the morning of surgery [phone]   Remember:  Do not eat food or drink liquids :After Midnight.     Take these medicines the morning of surgery with A SIP OF WATER: simvastatin            STOP ASPIRIN AND HERBAL MEDS 5 DAYS PREOP                               You may not have any metal on your body including hair pins and              piercings  Do not wear jewelry, make-up, lotions, powders or perfumes.             Do not wear nail polish.  Do not shave  48 hours prior to surgery.              Men may shave face and neck.   Do not bring valuables to the hospital. [facility] IS NOT             RESPONSIBLE   FOR VALUABLES.  Contacts, dentures or bridgework may not be worn into surgery.  Leave suitcase in the car. After surgery it may be brought to your room.     Patients discharged the day of surgery will not be allowed to drive home.  Name and phone number of your driver:  Special Instructions: N/A              Please read over the following fact sheets you were given: _____________________________________________________________________                                                     [place] - PREPARING FOR SURGERY  Before surgery, you can play an important role.  Because skin is not sterile, your skin needs to be as free of germs as possible.  You can reduce the number of germs on your skin by washing with CHG (chlorahexidine gluconate) soap before surgery.  CHG is an antiseptic cleaner which kills germs and bonds with the skin to continue killing germs even after washing. Please DO NOT use if you have an allergy to CHG or antibacterial soaps.  If your skin becomes reddened/irritated stop using the CHG and inform your nurse when  you arrive at Short Stay. Do not shave (including legs and underarms) for at least 48 hours prior to the first CHG shower.  You may shave your face. Please follow these instructions carefully:   1.  Shower with CHG Soap the night before surgery and the  morning of Surgery.   2.  If you choose to wash your hair, wash your hair first as usual with your  normal  Shampoo.   3.  After you shampoo, rinse your hair and body thoroughly to remove the  shampoo.  4.  Use CHG as you would any other liquid soap.  You can apply chg directly  to the skin and wash . Gently wash with scrungie or clean wascloth    5.  Apply the CHG Soap to your body ONLY FROM THE NECK DOWN.   Do not use on open                           Wound or open sores. Avoid contact with eyes, ears mouth and genitals (private parts).                        Genitals (private parts) with your normal soap.              6.  Wash thoroughly, paying special attention to the area where your surgery  will be performed.   7.  Thoroughly rinse your body with warm water from the neck down.   8.  DO NOT shower/wash with your normal soap after using and rinsing off  the CHG Soap .                9.  Pat yourself dry with a clean towel.             10.  Wear clean pajamas.             11.  Place clean sheets on your bed the night of your first shower and do not  sleep with pets.  Day of Surgery : Do not apply any lotions/deodorants the morning of surgery.  Please wear clean clothes to the hospital/surgery center.  FAILURE TO FOLLOW THESE INSTRUCTIONS MAY RESULT IN THE CANCELLATION OF YOUR SURGERY    PATIENT SIGNATURE_________________________________  ______________________________________________________________________

## 2014-10-14 ENCOUNTER — Encounter (HOSPITAL_COMMUNITY): Payer: Self-pay

## 2014-10-14 ENCOUNTER — Encounter (HOSPITAL_COMMUNITY)
Admission: RE | Admit: 2014-10-14 | Discharge: 2014-10-14 | Disposition: A | Discharge status: Home / Self Care | Payer: Federal, State, Local not specified - PPO | Source: Ambulatory Visit | Attending: General Surgery | Admitting: General Surgery

## 2014-10-14 ENCOUNTER — Encounter (INDEPENDENT_AMBULATORY_CARE_PROVIDER_SITE_OTHER): Payer: Self-pay

## 2014-10-14 DIAGNOSIS — Z01818 Encounter for other preprocedural examination: Secondary | ICD-10-CM | POA: Insufficient documentation

## 2014-10-14 DIAGNOSIS — K4020 Bilateral inguinal hernia, without obstruction or gangrene, not specified as recurrent: Secondary | ICD-10-CM | POA: Insufficient documentation

## 2014-10-14 HISTORY — DX: Unilateral inguinal hernia, without obstruction or gangrene, not specified as recurrent: K40.90

## 2014-10-14 LAB — COMPREHENSIVE METABOLIC PANEL
ALK PHOS: 57 U/L (ref 39–117)
ALT: 19 U/L (ref 0–53)
AST: 27 U/L (ref 0–37)
Albumin: 4.2 g/dL (ref 3.5–5.2)
Anion gap: 6 (ref 5–15)
BUN: 20 mg/dL (ref 6–23)
CALCIUM: 9.4 mg/dL (ref 8.4–10.5)
CHLORIDE: 103 mmol/L (ref 96–112)
CO2: 31 mmol/L (ref 19–32)
Creatinine, Ser: 0.83 mg/dL (ref 0.50–1.35)
GFR calc Af Amer: >90 mL/min (ref >90)
GFR calc non Af Amer: >90 mL/min (ref >90)
Glucose, Bld: 105 mg/dL — ABNORMAL HIGH (ref 70–99)
Potassium: 5 mmol/L (ref 3.5–5.1)
SODIUM: 140 mmol/L (ref 135–145)
Total Bilirubin: 0.7 mg/dL (ref 0.3–1.2)
Total Protein: 7.8 g/dL (ref 6.0–8.3)

## 2014-10-14 LAB — CBC WITH DIFFERENTIAL/PLATELET
BASOS PCT: 1 % (ref 0–1)
Basophils Absolute: 0 10*3/uL (ref 0.0–0.1)
EOS PCT: 6 % — AB (ref 0–5)
Eosinophils Absolute: 0.4 10*3/uL (ref 0.0–0.7)
HCT: 43.3 % (ref 39.0–52.0)
Hemoglobin: 14.2 g/dL (ref 13.0–17.0)
LYMPHS ABS: 2.4 10*3/uL (ref 0.7–4.0)
Lymphocytes Relative: 33 % (ref 12–46)
MCH: 29.8 pg (ref 26.0–34.0)
MCHC: 32.8 g/dL (ref 30.0–36.0)
MCV: 90.8 fL (ref 78.0–100.0)
MONO ABS: 0.5 10*3/uL (ref 0.1–1.0)
Monocytes Relative: 7 % (ref 3–12)
Neutro Abs: 3.9 10*3/uL (ref 1.7–7.7)
Neutrophils Relative %: 53 % (ref 43–77)
PLATELETS: 283 10*3/uL (ref 150–400)
RBC: 4.77 MIL/uL (ref 4.22–5.81)
RDW: 12.9 % (ref 11.5–15.5)
WBC: 7.2 10*3/uL (ref 4.0–10.5)

## 2014-10-16 NOTE — H&P (Signed)
Randall Torres  Location: [facility] Patient #: [id number] DOB: [birth year] Married / Language: English / Race: White Male      History of Present Illness Patient words: possible hernia.  The patient is a 64 year old male who presents with a complaint of bilateral inguinal hernias. This is a pleasant, cooperative 64 year old man, referred by Dr. Berniece Andreas for evaluation of right groin pain and bulge. He has no symptoms on the left side. This gentleman gives a one-month history of discomfort and bulge in the right groin. He thinks this is progressive. No symptoms on the left side. No prior history of hernia. He had a pelvic ultrasound which was negative. Comorbidities include past history of alcoholism, hospitalized in 2010. Depressive disorder. Adjustment disorder. Attention deficit disorder. BPH. Hyperlipidemia. Personal history of colonic adenoma. He walks and swims regularly. This is beginning to interfere with his activities He is employed as a Ambulance person. I spent a good deal of time discussing open repair technique and laparoscopic technique. I outlined the pros and cons of each. I told him him that either was an option and was a standard of care. At the end of the encounter he has decided to go ahead and schedule for laparoscopic repair of bilateral inguinal hernias with mesh, possible open. I discussed the indications, details, techniques, and numerous risk of the surgery with him. These are also outlined in the patient information booklets which I reviewed with him. He is aware the risks of bleeding, infection, conversion to open surgery, recurrence of the hernia, nerve damage with chronic pain, injury to adjacent organs such as the testicle, bladder, or intestine, cardiac, pulmonary and thromboembolic problems. He understands all of these issues. All of his questions are answered. He agrees with this plan.   Allergies  No Known Drug  Allergies01/02/2015  Medication History  Simvastatin (  Tablet, Oral) Active.  Review of Systems General Not Present- Appetite Loss, Chills, Fatigue, Fever, Night Sweats, Weight Gain and Weight Loss. Skin Not Present- Change in Wart/Mole, Dryness, Hives, Jaundice, New Lesions, Non-Healing Wounds, Rash and Ulcer. HEENT Present- Wears glasses/contact lenses. Not Present- Earache, Hearing Loss, Hoarseness, Nose Bleed, Oral Ulcers, Ringing in the Ears, Seasonal Allergies, Sinus Pain, Sore Throat, Visual Disturbances and Yellow Eyes. Respiratory Not Present- Bloody sputum, Chronic Cough, Difficulty Breathing, Snoring and Wheezing. Breast Not Present- Breast Mass, Breast Pain, Nipple Discharge and Skin Changes. Cardiovascular Not Present- Chest Pain, Difficulty Breathing Lying Down, Leg Cramps, Palpitations, Rapid Heart Rate, Shortness of Breath and Swelling of Extremities. Gastrointestinal Not Present- Abdominal Pain, Bloating, Bloody Stool, Change in Bowel Habits, Chronic diarrhea, Constipation, Difficulty Swallowing, Excessive gas, Gets full quickly at meals, Hemorrhoids, Indigestion, Nausea, Rectal Pain and Vomiting. Male Genitourinary Present- Frequency and Nocturia. Not Present- Blood in Urine, Change in Urinary Stream, Impotence, Painful Urination, Urgency and Urine Leakage.   Vitals  Weight: 190.5 lb Height: 69in Body Surface Area: 2.05 m Body Mass Index: 28.13 kg/m Temp.: 98.65F  Pulse: 68 (Regular)  BP: 160/100 (Sitting, Left Arm, Standard)    Physical Exam General Mental Status-Alert. General Appearance-Consistent with stated age. Hydration-Well hydrated. Voice-Normal. Note: Pleasant. Appears fit for his age. BMI 28.   Head and Neck Head-normocephalic, atraumatic with no lesions or palpable masses. Trachea-midline. Thyroid Gland Characteristics - normal size and consistency.  Eye Eyeball - Bilateral-Extraocular movements  intact. Sclera/Conjunctiva - Bilateral-No scleral icterus.  Chest and Lung Exam Chest and lung exam reveals -quiet, even and easy respiratory effort with no use of accessory  muscles and on auscultation, normal breath sounds, no adventitious sounds and normal vocal resonance. Inspection Chest Wall - Normal. Back - normal.  Breast Breast - Left-Symmetric, Non Tender, No Biopsy scars, no Dimpling, No Inflammation, No Lumpectomy scars, No Mastectomy scars, No Peau d' Orange. Breast - Right-Symmetric, Non Tender, No Biopsy scars, no Dimpling, No Inflammation, No Lumpectomy scars, No Mastectomy scars, No Peau d' Orange. Breast Lump-No Palpable Breast Mass.  Cardiovascular Cardiovascular examination reveals -normal heart sounds, regular rate and rhythm with no murmurs and normal pedal pulses bilaterally.  Abdomen Inspection Inspection of the abdomen reveals - No Hernias. Skin - Scar - no surgical scars. Palpation/Percussion Palpation and Percussion of the abdomen reveal - Soft, Non Tender, No Rebound tenderness, No Rigidity (guarding) and No hepatosplenomegaly. Auscultation Auscultation of the abdomen reveals - Bowel sounds normal. Note: Umbilicus normal.   Male Genitourinary Note: Medium size right inguinal hernia, obvious, reducible. Does not extend into the scrotum. Smaller left inguinal hernia obvious on physical exam. Penis scrotum and testes normal. No scrotal mass.   Neurologic Neurologic evaluation reveals -alert and oriented x 3 with no impairment of recent or remote memory. Mental Status-Normal.  Musculoskeletal Normal Exam - Left-Upper Extremity Strength Normal and Lower Extremity Strength Normal. Normal Exam - Right-Upper Extremity Strength Normal and Lower Extremity Strength Normal.  Lymphatic Head & Neck  General Head & Neck Lymphatics: Bilateral - Description - Normal. Axillary  General Axillary Region: Bilateral - Description - Normal.  Tenderness - Non Tender. Femoral & Inguinal  Generalized Femoral & Inguinal Lymphatics: Bilateral - Description - Normal. Tenderness - Non Tender.    Assessment & Plan   BILATERAL INGUINAL HERNIA WITHOUT OBSTRUCTION OR GANGRENE, RECURRENCE NOT SPECIFIED (550.92  K40.20)  HISTORY OF ALCOHOL ABUSE (305.03  Z87.898) BENIGN NON-NODULAR PROSTATIC HYPERPLASIA WITHOUT LOWER URINARY TRACT SYMPTOMS (600.90  N40.0) ADJUSTMENT DISORDER, UNSPECIFIED TYPE (309.9  F43.20) HYPERLIPIDEMIA, ACQUIRED (272.4  E78.5  Current Plans  Schedule for Surgery You have a medium sized, reducible right inguinal hernia, and a smaller left inguinal hernia. We have discussed the anatomy and options for surgical intervention. We have decided to go ahead and schedule for laparoscopic repair of bilateral inguinal hernias with mesh. We have discussed the techniques and risks of the surgery in detail. The surgery will be scheduled at your convenience. Please read the patient information booklets that I gave you.  Angelia MouldHaywood M. Derrell LollingIngram, M.D., [facility]wall[facility]ACS [facility] General and Minimally invasive Surgery Breast and Colorectal Surgery Office:   [phone] Pager:   [phone]

## 2014-10-18 ENCOUNTER — Ambulatory Visit (HOSPITAL_COMMUNITY): Payer: Federal, State, Local not specified - PPO | Admitting: Anesthesiology

## 2014-10-18 ENCOUNTER — Encounter (HOSPITAL_COMMUNITY): Payer: Self-pay

## 2014-10-18 ENCOUNTER — Ambulatory Visit (HOSPITAL_COMMUNITY)
Admission: RE | Admit: 2014-10-18 | Discharge: 2014-10-18 | Disposition: A | Discharge status: Home / Self Care | Payer: Federal, State, Local not specified - PPO | Source: Ambulatory Visit | Attending: General Surgery | Admitting: General Surgery

## 2014-10-18 ENCOUNTER — Encounter (HOSPITAL_COMMUNITY)
Admission: RE | Disposition: A | Discharge status: Home / Self Care | Payer: Self-pay | Source: Ambulatory Visit | Attending: General Surgery

## 2014-10-18 DIAGNOSIS — E785 Hyperlipidemia, unspecified: Secondary | ICD-10-CM | POA: Diagnosis not present

## 2014-10-18 DIAGNOSIS — K4020 Bilateral inguinal hernia, without obstruction or gangrene, not specified as recurrent: Secondary | ICD-10-CM

## 2014-10-18 DIAGNOSIS — Z79899 Other long term (current) drug therapy: Secondary | ICD-10-CM | POA: Insufficient documentation

## 2014-10-18 DIAGNOSIS — N400 Enlarged prostate without lower urinary tract symptoms: Secondary | ICD-10-CM | POA: Diagnosis not present

## 2014-10-18 DIAGNOSIS — F329 Major depressive disorder, single episode, unspecified: Secondary | ICD-10-CM | POA: Diagnosis not present

## 2014-10-18 HISTORY — PX: INSERTION OF MESH: SHX5868

## 2014-10-18 HISTORY — PX: INGUINAL HERNIA REPAIR: SHX194

## 2014-10-18 SURGERY — REPAIR, HERNIA, INGUINAL, BILATERAL, LAPAROSCOPIC
Anesthesia: General | Laterality: Bilateral

## 2014-10-18 MED ORDER — ONDANSETRON HCL 4 MG/2ML IJ SOLN
INTRAMUSCULAR | Status: DC | PRN
  Administered 2014-10-18: 4 mg via INTRAVENOUS

## 2014-10-18 MED ORDER — MIDAZOLAM HCL 5 MG/5ML IJ SOLN
INTRAMUSCULAR | Status: DC | PRN
  Administered 2014-10-18: 2 mg via INTRAVENOUS

## 2014-10-18 MED ORDER — LIDOCAINE HCL (CARDIAC) 20 MG/ML IV SOLN
INTRAVENOUS | Status: AC
  Filled 2014-10-18: qty 5

## 2014-10-18 MED ORDER — OXYCODONE-ACETAMINOPHEN 7.5-325 MG PO TABS: 1.0000 | ORAL_TABLET | ORAL | Status: DC | PRN

## 2014-10-18 MED ORDER — LACTATED RINGERS IV SOLN
INTRAVENOUS | Status: DC | PRN
  Administered 2014-10-18 (×2): via INTRAVENOUS

## 2014-10-18 MED ORDER — CHLORHEXIDINE GLUCONATE 4 % EX LIQD: 1.0000 "application " | Freq: Once | CUTANEOUS | Status: DC

## 2014-10-18 MED ORDER — ONDANSETRON HCL 4 MG/2ML IJ SOLN
INTRAMUSCULAR | Status: AC
  Filled 2014-10-18: qty 2

## 2014-10-18 MED ORDER — DEXAMETHASONE SODIUM PHOSPHATE 10 MG/ML IJ SOLN
INTRAMUSCULAR | Status: DC | PRN
  Administered 2014-10-18: 10 mg via INTRAVENOUS

## 2014-10-18 MED ORDER — LIDOCAINE HCL (CARDIAC) 20 MG/ML IV SOLN
INTRAVENOUS | Status: DC | PRN
  Administered 2014-10-18: 50 mg via INTRAVENOUS

## 2014-10-18 MED ORDER — NEOSTIGMINE METHYLSULFATE 10 MG/10ML IV SOLN
INTRAVENOUS | Status: DC | PRN
  Administered 2014-10-18: 4 mg via INTRAVENOUS

## 2014-10-18 MED ORDER — BUPIVACAINE-EPINEPHRINE 0.5% -1:200000 IJ SOLN
INTRAMUSCULAR | Status: AC
  Filled 2014-10-18: qty 1

## 2014-10-18 MED ORDER — SODIUM CHLORIDE 0.9 % IJ SOLN: 3.0000 mL | Freq: Two times a day (BID) | INTRAMUSCULAR | Status: DC

## 2014-10-18 MED ORDER — MEPERIDINE HCL 50 MG/ML IJ SOLN: 6.2500 mg | INTRAMUSCULAR | Status: DC | PRN

## 2014-10-18 MED ORDER — ACETAMINOPHEN 325 MG PO TABS: 650.0000 mg | ORAL_TABLET | ORAL | Status: DC | PRN

## 2014-10-18 MED ORDER — GLYCOPYRROLATE 0.2 MG/ML IJ SOLN
INTRAMUSCULAR | Status: DC | PRN
  Administered 2014-10-18: 0.6 mg via INTRAVENOUS

## 2014-10-18 MED ORDER — NEOSTIGMINE METHYLSULFATE 10 MG/10ML IV SOLN
INTRAVENOUS | Status: AC
  Filled 2014-10-18: qty 1

## 2014-10-18 MED ORDER — PROPOFOL 10 MG/ML IV BOLUS
INTRAVENOUS | Status: DC | PRN
  Administered 2014-10-18: 180 mg via INTRAVENOUS

## 2014-10-18 MED ORDER — OXYCODONE HCL 5 MG PO TABS
5.0000 mg | ORAL_TABLET | ORAL | Status: DC | PRN
  Administered 2014-10-18 (×2): 5 mg via ORAL
  Filled 2014-10-18 (×2): qty 1

## 2014-10-18 MED ORDER — ROCURONIUM BROMIDE 100 MG/10ML IV SOLN
INTRAVENOUS | Status: AC
  Filled 2014-10-18: qty 1

## 2014-10-18 MED ORDER — FENTANYL CITRATE 0.05 MG/ML IJ SOLN
INTRAMUSCULAR | Status: AC
  Filled 2014-10-18: qty 5

## 2014-10-18 MED ORDER — PROPOFOL 10 MG/ML IV BOLUS
INTRAVENOUS | Status: AC
  Filled 2014-10-18: qty 20

## 2014-10-18 MED ORDER — FENTANYL CITRATE 0.05 MG/ML IJ SOLN
INTRAMUSCULAR | Status: AC
  Filled 2014-10-18: qty 2

## 2014-10-18 MED ORDER — SODIUM CHLORIDE 0.9 % IJ SOLN: 3.0000 mL | INTRAMUSCULAR | Status: DC | PRN

## 2014-10-18 MED ORDER — HYDROMORPHONE HCL 2 MG/ML IJ SOLN
INTRAMUSCULAR | Status: AC
  Filled 2014-10-18: qty 1

## 2014-10-18 MED ORDER — LACTATED RINGERS IV SOLN
INTRAVENOUS | Status: DC
Start: 2014-10-18 — End: 2014-10-18

## 2014-10-18 MED ORDER — FENTANYL CITRATE 0.05 MG/ML IJ SOLN
25.0000 ug | INTRAMUSCULAR | Status: DC | PRN
  Administered 2014-10-18: 25 ug via INTRAVENOUS

## 2014-10-18 MED ORDER — ROCURONIUM BROMIDE 100 MG/10ML IV SOLN
INTRAVENOUS | Status: DC | PRN
  Administered 2014-10-18: 25 mg via INTRAVENOUS
  Administered 2014-10-18 (×2): 10 mg via INTRAVENOUS
  Administered 2014-10-18: 5 mg via INTRAVENOUS

## 2014-10-18 MED ORDER — ACETAMINOPHEN 650 MG RE SUPP
650.0000 mg | RECTAL | Status: DC | PRN
  Filled 2014-10-18: qty 1

## 2014-10-18 MED ORDER — MIDAZOLAM HCL 2 MG/2ML IJ SOLN
INTRAMUSCULAR | Status: AC
  Filled 2014-10-18: qty 2

## 2014-10-18 MED ORDER — BUPIVACAINE-EPINEPHRINE 0.5% -1:200000 IJ SOLN
INTRAMUSCULAR | Status: DC | PRN
  Administered 2014-10-18: 10 mL

## 2014-10-18 MED ORDER — CEFAZOLIN SODIUM-DEXTROSE 2-3 GM-% IV SOLR
INTRAVENOUS | Status: AC
  Filled 2014-10-18: qty 50

## 2014-10-18 MED ORDER — GLYCOPYRROLATE 0.2 MG/ML IJ SOLN
INTRAMUSCULAR | Status: AC
  Filled 2014-10-18: qty 3

## 2014-10-18 MED ORDER — FENTANYL CITRATE 0.05 MG/ML IJ SOLN
INTRAMUSCULAR | Status: DC | PRN
  Administered 2014-10-18: 50 ug via INTRAVENOUS
  Administered 2014-10-18 (×2): 100 ug via INTRAVENOUS

## 2014-10-18 MED ORDER — LACTATED RINGERS IV SOLN: INTRAVENOUS | Status: DC

## 2014-10-18 MED ORDER — CEFAZOLIN SODIUM-DEXTROSE 2-3 GM-% IV SOLR
2.0000 g | INTRAVENOUS | Status: AC
  Administered 2014-10-18: 2 g via INTRAVENOUS

## 2014-10-18 MED ORDER — 0.9 % SODIUM CHLORIDE (POUR BTL) OPTIME
TOPICAL | Status: DC | PRN
  Administered 2014-10-18: 1000 mL

## 2014-10-18 MED ORDER — HYDROMORPHONE HCL 1 MG/ML IJ SOLN
INTRAMUSCULAR | Status: DC | PRN
  Administered 2014-10-18 (×2): 1 mg via INTRAVENOUS

## 2014-10-18 MED ORDER — SODIUM CHLORIDE 0.9 % IV SOLN: INTRAVENOUS | Status: DC

## 2014-10-18 MED ORDER — PROMETHAZINE HCL 25 MG/ML IJ SOLN: 6.2500 mg | INTRAMUSCULAR | Status: DC | PRN

## 2014-10-18 MED ORDER — FENTANYL CITRATE 0.05 MG/ML IJ SOLN: 25.0000 ug | INTRAMUSCULAR | Status: DC | PRN

## 2014-10-18 MED ORDER — DEXAMETHASONE SODIUM PHOSPHATE 10 MG/ML IJ SOLN
INTRAMUSCULAR | Status: AC
  Filled 2014-10-18: qty 1

## 2014-10-18 MED ORDER — LACTATED RINGERS IV SOLN
INTRAVENOUS | Status: DC | PRN
  Administered 2014-10-18: 1000 mL via INTRAVENOUS

## 2014-10-18 MED ORDER — SODIUM CHLORIDE 0.9 % IV SOLN: 250.0000 mL | INTRAVENOUS | Status: DC | PRN

## 2014-10-18 SURGICAL SUPPLY — 41 items
APL SKNCLS STERI-STRIP NONHPOA (GAUZE/BANDAGES/DRESSINGS)
APPLIER CLIP 5 13 M/L LIGAMAX5 (MISCELLANEOUS) ×2
APR CLP MED LRG 5 ANG JAW (MISCELLANEOUS) ×1
BENZOIN TINCTURE PRP APPL 2/3 (GAUZE/BANDAGES/DRESSINGS) IMPLANT
CABLE HIGH FREQUENCY MONO STRZ (ELECTRODE) IMPLANT
CLIP APPLIE 5 13 M/L LIGAMAX5 (MISCELLANEOUS) IMPLANT
DECANTER SPIKE VIAL GLASS SM (MISCELLANEOUS) ×2 IMPLANT
DISSECT BALLN SPACEMKR + OVL (BALLOONS) ×2
DISSECTOR BALLN SPACEMKR + OVL (BALLOONS) ×1 IMPLANT
DISSECTOR BLUNT TIP ENDO 5MM (MISCELLANEOUS) IMPLANT
DRAPE LAPAROSCOPIC ABDOMINAL (DRAPES) ×2 IMPLANT
ELECT REM PT RETURN 9FT ADLT (ELECTROSURGICAL) ×2
ELECTRODE REM PT RTRN 9FT ADLT (ELECTROSURGICAL) ×1 IMPLANT
GLOVE BIOGEL PI IND STRL 7.0 (GLOVE) ×1 IMPLANT
GLOVE BIOGEL PI INDICATOR 7.0 (GLOVE) ×5
GLOVE EUDERMIC 7 POWDERFREE (GLOVE) ×2 IMPLANT
GOWN STRL REUS W/TWL LRG LVL3 (GOWN DISPOSABLE) ×3 IMPLANT
GOWN STRL REUS W/TWL XL LVL3 (GOWN DISPOSABLE) ×4 IMPLANT
HOLDER FOLEY CATH W/STRAP (MISCELLANEOUS) ×1 IMPLANT
KIT BASIN OR (CUSTOM PROCEDURE TRAY) ×2 IMPLANT
LIQUID BAND (GAUZE/BANDAGES/DRESSINGS) ×1 IMPLANT
MESH 3DMAX 4X6 LT LRG (Mesh General) ×1 IMPLANT
MESH 3DMAX 4X6 RT LRG (Mesh General) ×1 IMPLANT
NDL INSUFFLATION 14GA 120MM (NEEDLE) IMPLANT
NEEDLE INSUFFLATION 14GA 120MM (NEEDLE) IMPLANT
SCISSORS LAP 5X35 DISP (ENDOMECHANICALS) ×1 IMPLANT
SET IRRIG TUBING LAPAROSCOPIC (IRRIGATION / IRRIGATOR) ×1 IMPLANT
SOLUTION ANTI FOG 6CC (MISCELLANEOUS) ×1 IMPLANT
STOPCOCK K 69 2C6206 (IV SETS) ×1 IMPLANT
STRIP CLOSURE SKIN 1/2X4 (GAUZE/BANDAGES/DRESSINGS) IMPLANT
SUT MNCRL AB 4-0 PS2 18 (SUTURE) ×2 IMPLANT
SUT VIC AB 3-0 SH 27 (SUTURE)
SUT VIC AB 3-0 SH 27XBRD (SUTURE) IMPLANT
TACKER 5MM HERNIA 3.5CML NAB (ENDOMECHANICALS) ×2 IMPLANT
TOWEL OR 17X26 10 PK STRL BLUE (TOWEL DISPOSABLE) ×2 IMPLANT
TRAY FOLEY CATH 14FRSI W/METER (CATHETERS) ×1 IMPLANT
TRAY FOLEY CATH 16FRSI W/METER (SET/KITS/TRAYS/PACK) ×1 IMPLANT
TRAY LAPAROSCOPIC (CUSTOM PROCEDURE TRAY) ×2 IMPLANT
TROCAR BLADELESS OPT 5 75 (ENDOMECHANICALS) IMPLANT
TROCAR CANNULA W/PORT DUAL 5MM (MISCELLANEOUS) ×2 IMPLANT
TUBING INSUFFLATION 10FT LAP (TUBING) ×1 IMPLANT

## 2014-10-18 NOTE — Anesthesia Preprocedure Evaluation (Addendum)
Anesthesia Evaluation  Patient identified by MRN, date of birth, ID band Patient awake    Reviewed: Allergy & Precautions, NPO status , Patient's Chart, lab work & pertinent test results  Airway Mallampati: II  TM Distance: >3 FB Neck ROM: Full    Dental no notable dental hx.    Pulmonary neg pulmonary ROS,  breath sounds clear to auscultation  Pulmonary exam normal       Cardiovascular negative cardio ROS  Rhythm:Regular Rate:Normal     Neuro/Psych negative neurological ROS  negative psych ROS   GI/Hepatic negative GI ROS, Neg liver ROS,   Endo/Other  negative endocrine ROS  Renal/GU negative Renal ROS  negative genitourinary   Musculoskeletal negative musculoskeletal ROS (+)   Abdominal   Peds negative pediatric ROS (+)  Hematology negative hematology ROS (+)   Anesthesia Other Findings   Reproductive/Obstetrics negative OB ROS                             Anesthesia Physical Anesthesia Plan  ASA: I  Anesthesia Plan: General   Post-op Pain Management:    Induction: Intravenous  Airway Management Planned: Oral ETT  Additional Equipment:   Intra-op Plan:   Post-operative Plan: Extubation in OR  Informed Consent: I have reviewed the patients History and Physical, chart, labs and discussed the procedure including the risks, benefits and alternatives for the proposed anesthesia with the patient or authorized representative who has indicated his/her understanding and acceptance.   Dental advisory given  Plan Discussed with: CRNA  Anesthesia Plan Comments:         Anesthesia Quick Evaluation  

## 2014-10-18 NOTE — Anesthesia Postprocedure Evaluation (Signed)
  Anesthesia Post-op Note  Patient: Randall Torres  Procedure(s) Performed: Procedure(s) (LRB): LAPAROSCOPIC BILATERAL INGUINAL HERNIA REPAIR   (Bilateral) INSERTION OF MESH (Bilateral)  Patient Location: PACU  Anesthesia Type: General  Level of Consciousness: awake and alert   Airway and Oxygen Therapy: Patient Spontanous Breathing  Post-op Pain: mild  Post-op Assessment: Post-op Vital signs reviewed, Patient's Cardiovascular Status Stable, Respiratory Function Stable, Patent Airway and No signs of Nausea or vomiting  Last Vitals:  Filed Vitals:   10/18/14 1046  BP: 130/73  Pulse: 60  Temp: 36.7 C  Resp: 15    Post-op Vital Signs: stable   Complications: No apparent anesthesia complications

## 2014-10-18 NOTE — Op Note (Signed)
Patient Name:           Randall Torres   Date of Surgery:        10/18/2014  Pre op Diagnosis:      Bilateral inguinal hernias  Post op Diagnosis:    Bilateral inguinal hernias  Procedure:                 Laparoscopic, pre-peritoneal repair of bilateral inguinal hernias with 3-D max mesh  Surgeon:                     Angelia MouldHaywood M. Derrell LollingIngram, M.D., FACS  Assistant:                      OR staff  Operative Indications:   This is a pleasant, cooperative 64 year old man, referred by Dr. Berniece AndreasWanda Panosh for evaluation of right groin pain and bulge.    This gentleman gives a one-month history of discomfort and bulge in the right groin. He thinks this is progressive. No symptoms on the left side. No prior history of hernia. He had a pelvic ultrasound which was negative. Comorbidities include past history of alcoholism, hospitalized in 2010. Depressive disorder. Adjustment disorder. Attention deficit disorder. BPH. Hyperlipidemia. Personal history of colonic adenoma. He walks and swims regularly. This is beginning to interfere with his activities He is employed as a Ambulance personfederal prosecutor. I spent a good deal of time discussing open repair technique and laparoscopic technique. I outlined the pros and cons of each. I told him him that either was an option and was a standard of care. At the end of the encounter he has decided to go ahead and schedule for laparoscopic repair of bilateral inguinal hernias with mesh, possible open.  Operative Findings:       The patient had bilateral indirect inguinal hernias. Somewhat larger on the right. Some weakness in the direct space on the right but no large hernia. Nothing incarcerated. On each side I was able to pull the indirect hernia sac all the way back above the level of the anterior superior iliac spine.  Procedure in Detail:          Following the induction of general endotracheal anesthesia, the patient's bladder was emptied with a catheter. The  abdomen and genitalia were prepped and draped in a sterile fashion. Surgical timeout was performed. Intravenous antibiotics were given. 0.5% Marcaine with epinephrine was used as local infiltration anesthetic.    Transverse incision was made below the umbilicus. The fascia was incised transversely exposing the medial border of the right rectus muscle. The balloon was inserted into the right rectus sheath, in the midline, down until just above the symphysis pubis. Video camera was inserted. The dissector balloon was inflated with air under direct vision. It deployed somewhat better on the right than the left but there was minimal bleeding and it was held in place for about 5 minutes, deflated and removed. The trocar balloon was then inflated and secured and the  greater trocar was connected to the insufflator at 13 mmHg.    Video camera was inserted. Minimal bleeding. 5 mm trocar placed in the midline below the umbilicus. I used this to clean off the fascia of the right side. I was able to pull the peritoneum down fairly extensively and place a  5 mm trocar in the right lateral abdomen above the level of the anterior superior iliac spine. I then dissected the peritoneum down on the left side.  I put a 5 mm trocar in the left lateral abdomen as well. The dissector balloon had pulled the inferior epigastric vessels down on the right side and I had to isolate these, secure them with clips and divide them. On each side I dissected out the cord structures fairly extensively. I identified the testicular vessels and vas deferens on each side. On each side I pulled the indirect hernia sacs back this far as possible but well above the level of the anterior superior iliac spine. There were no other hernias. The symphysis pubis and Cooper's ligaments were identified and cleaned off slightly. On each side I repaired the hernias with a large size 3-D max mesh. On each side the mesh was positioned so as to overlap the midline  slightly, overlapped Cooper's ligament slightly, and deployed laterally. The mesh on each side was secured with the 5 mm Pro tacker and the repairs looked good with good coverage of both femoral,  direct and indirect spaces and the previously dissected hernia sacs were pulled well back above the mesh. After checking for bleeding and finding none the trochars were removed and the pneumoperitoneum released. The fascia at the umbilicus was closed with figure-of-eight sutures of 0 Vicryl and the skin incisions closed with subcuticular 4-0 Monocryl and Dermabond. Foley catheter was removed after ascertaining that the urine was clear. The patient tolerated the procedure well was taken to PACU in stable condition. EBL 10-15 mL. Counts correct. Complications none.     Angelia Mould. Derrell Lolling, M.D., FACS General and Minimally Invasive Surgery Breast and Colorectal Surgery  10/18/2014 9:20 AM

## 2014-10-18 NOTE — Progress Notes (Signed)
patient has been up on toilet continuously unable to void. Is begging for a foley cath for relief. Scanned at least 425cc. Patient did not lay still for scan. Text into Dr Derrell LollingIngram w immediate return call. Cath orders received. Patient choose foley over I & O cath

## 2014-10-18 NOTE — Transfer of Care (Signed)
Immediate Anesthesia Transfer of Care Note  Patient: Bjorn LoserMichael F Lotito  Procedure(s) Performed: Procedure(s): LAPAROSCOPIC BILATERAL INGUINAL HERNIA REPAIR   (Bilateral) INSERTION OF MESH (Bilateral)  Patient Location: PACU  Anesthesia Type:General  Level of Consciousness: awake, alert  and oriented  Airway & Oxygen Therapy: Patient Spontanous Breathing and Patient connected to face mask oxygen  Post-op Assessment: Report given to RN and Post -op Vital signs reviewed and stable  Post vital signs: Reviewed and stable  Last Vitals:  Filed Vitals:   10/18/14 0529  BP: 134/92  Pulse: 57  Temp: 36.5 C  Resp: 18    Complications: No apparent anesthesia complications

## 2014-10-18 NOTE — Discharge Instructions (Signed)
Hernia Repair with Laparoscope °A hernia occurs when an internal organ pushes out through a weak spot in the belly (abdominal) wall muscles. Hernias most commonly occur in the groin and around the navel. Hernias can also occur through a cut by the surgeon (incision) after an abdominal operation. A hernia may be caused by: °· Lifting heavy objects. °· Prolonged coughing. °· Straining to move your bowels. °Hernias can often be pushed back into place (reduced). Most hernias tend to get worse over time. Problems occur when abdominal contents get stuck in the opening and the blood supply is blocked or impaired (incarcerated hernia). Because of these risks, you require surgery to repair the hernia. °Your hernia will be repaired using a laparoscope. Laparoscopic surgery is a type of minimally invasive surgery. It does not involve making a typical surgical cut (incision) in the skin. A laparoscope is a telescope-like rod and lens system. It is usually connected to a video camera and a light source so your caregiver can clearly see the operative area. The instruments are inserted through ¼ to ½ inch (5 mm or 10 mm) openings in the skin at specific locations. A working and viewing space is created by blowing a small amount of carbon dioxide gas into the abdominal cavity. The abdomen is essentially blown up like a balloon (insufflated). This elevates the abdominal wall above the internal organs like a dome. The carbon dioxide gas is common to the human body and can be absorbed by tissue and removed by the respiratory system. Once the repair is completed, the small incisions will be closed with either stitches (sutures) or staples (just like a paper stapler only this staple holds the skin together). °LET YOUR CAREGIVERS KNOW ABOUT: °· Allergies. °· Medications taken including herbs, eye drops, over the counter medications, and creams. °· Use of steroids (by mouth or creams). °· Previous problems with anesthetics or  Novocaine. °· Possibility of pregnancy, if this applies. °· History of blood clots (thrombophlebitis). °· History of bleeding or blood problems. °· Previous surgery. °· Other health problems. °BEFORE THE PROCEDURE  °Laparoscopy can be done either in a hospital or out-patient clinic. You may be given a mild sedative to help you relax before the procedure. Once in the operating room, you will be given a general anesthesia to make you sleep (unless you and your caregiver choose a different anesthetic).  °AFTER THE PROCEDURE  °After the procedure you will be watched in a recovery area. Depending on what type of hernia was repaired, you might be admitted to the hospital or you might go home the same day. With this procedure you may have less pain and scarring. This usually results in a quicker recovery and less risk of infection. °HOME CARE INSTRUCTIONS  °· Bed rest is not required. You may continue your normal activities but avoid heavy lifting (more than 10 pounds) or straining. °· Cough gently. If you are a smoker it is best to stop, as even the best hernia repair can break down with the continual strain of coughing. °· Avoid driving until given the OK by your surgeon. °· There are no dietary restrictions unless given otherwise. °· TAKE ALL MEDICATIONS AS DIRECTED. °· Only take over-the-counter or prescription medicines for pain, discomfort, or fever as directed by your caregiver. °SEEK MEDICAL CARE IF:  °· There is increasing abdominal pain or pain in your incisions. °· There is more bleeding from incisions, other than minimal spotting. °· You feel light headed or faint. °· You   develop an unexplained fever, chills, and/or an oral temperature above 102° F (38.9° C). °· You have redness, swelling, or increasing pain in the wound. °· Pus coming from wound. °· A foul smell coming from the wound or dressings. °SEEK IMMEDIATE MEDICAL CARE IF:  °· You develop a rash. °· You have difficulty breathing. °· You have any  allergic problems. °MAKE SURE YOU:  °· Understand these instructions. °· Will watch your condition. °· Will get help right away if you are not doing well or get worse. °Document Released: 09/02/2005 Document Revised: 11/25/2011 Document Reviewed: 08/02/2009 °Exit[employer]® Patient Information ©2015 [employer]. This information is not intended to replace advice given to you by your health care provider. Make sure you discuss any questions you have with your health care provider. ° °[facility] _______[facility] ° °UMBILICAL OR INGUINAL HERNIA REPAIR: POST OP INSTRUCTIONS ° °Always review your discharge instruction sheet given to you by the facility where your surgery was performed. °IF YOU HAVE DISABILITY OR FAMILY LEAVE FORMS, YOU MUST BRING THEM TO THE OFFICE FOR PROCESSING.   °DO NOT GIVE THEM TO YOUR DOCTOR. ° °1. A  prescription for pain medication may be given to you upon discharge.  Take your pain medication as prescribed, if needed.  If narcotic pain medicine is not needed, then you may take acetaminophen (Tylenol) or ibuprofen (Advil) as needed. °2. Take your usually prescribed medications unless otherwise directed. °3. If you need a refill on your pain medication, please contact your pharmacy.  They will contact our office to request authorization. Prescriptions will not be filled after 5 pm or on week-ends. °4. You should follow a light diet the first 24 hours after arrival home, such as soup and crackers, etc.  Be sure to include lots of fluids daily.  Resume your normal diet the day after surgery. °5. Most patients will experience some swelling and bruising around the umbilicus or in the groin and scrotum.  Ice packs and reclining will help.  Swelling and bruising can take several days to resolve.  °6. It is common to experience some constipation if taking pain medication after surgery.  Increasing fluid intake and taking a stool softener (such as Colace) will usually help or prevent this problem  from occurring.  A mild laxative (Milk of Magnesia or Miralax) should be taken according to package directions if there are no bowel movements after 48 hours. °7. Unless discharge instructions indicate otherwise, you may remove your bandages 24-48 hours after surgery, and you may shower at that time.  You may have steri-strips (small skin tapes) in place directly over the incision.  These strips should be left on the skin for 7-10 days.  If your surgeon used skin glue on the incision, you may shower in 24 hours.  The glue will flake off over the next 2-3 weeks.  Any sutures or staples will be removed at the office during your follow-up visit. °8. ACTIVITIES:  You may resume regular (light) daily activities beginning the next day--such as daily self-care, walking, climbing stairs--gradually increasing activities as tolerated.  You may have sexual intercourse when it is comfortable.  Refrain from any heavy lifting or straining until approved by your doctor. °a. You may drive when you are no longer taking prescription pain medication, you can comfortably wear a seatbelt, and you can safely maneuver your car and apply brakes. °b. RETURN TO WORK:  __________________________________________________________ °9. You should see your doctor in the office for a follow-up appointment   approximately 2-3 weeks after your surgery.  Make sure that you call for this appointment within a day or two after you arrive home to insure a convenient appointment time. °10. OTHER INSTRUCTIONS:  __________________________________________________________________________________________________________________________________________________________________________________________  °WHEN TO CALL YOUR DOCTOR: °1. Fever over 101.0 °2. Inability to urinate °3. Nausea and/or vomiting °4. Extreme swelling or bruising °5. Continued bleeding from incision. °6. Increased pain, redness, or drainage from the incision ° °The clinic staff is available to  answer your questions during regular business hours.  Please don’t hesitate to call and ask to speak to one of the nurses for clinical concerns.  If you have a medical emergency, go to the nearest emergency room or call 911.  A surgeon from [facility] is always on call at the hospital ° ° °[address], [address], [place], [place]  [address] ? ° [address] [address], [place],    [address] °[phone] ? [phone] ? FAX [phone] °Web site: [email or web address] ° °

## 2014-10-18 NOTE — Progress Notes (Signed)
Asst up to void w max assistance. A little unsteady from anesthesia. Sat on toilet x 15 min w wife in restroom w/o success.

## 2014-10-18 NOTE — Interval H&P Note (Signed)
History and Physical Interval Note:  10/18/2014 7:09 AM  Randall Torres  has presented today for surgery, with the diagnosis of bilateral ingunial hernias  The goals and the various methods of treatment have been discussed with the patient and family. After consideration of risks, benefits and other options for treatment, the patient has consented to  Procedure(s): LAPAROSCOPIC BILATERAL INGUINAL HERNIA REPAIR POSSIBLE OPEN  (Bilateral) INSERTION OF MESH (Bilateral) as a surgical intervention .  The patient's history has been reviewed, patient examined, no change in status, stable for surgery.  I have reviewed the patient's chart and labs.  Questions were answered to the patient's satisfaction.     Ernestene MentionINGRAM,[name]

## 2014-10-19 ENCOUNTER — Encounter (HOSPITAL_COMMUNITY): Payer: Self-pay | Admitting: General Surgery

## 2014-10-20 ENCOUNTER — Encounter (HOSPITAL_COMMUNITY): Payer: Self-pay | Admitting: Emergency Medicine

## 2014-10-20 ENCOUNTER — Emergency Department (HOSPITAL_COMMUNITY): Payer: Federal, State, Local not specified - PPO

## 2014-10-20 ENCOUNTER — Emergency Department (HOSPITAL_COMMUNITY)
Admission: EM | Admit: 2014-10-20 | Discharge: 2014-10-20 | Disposition: A | Discharge status: Home / Self Care | Payer: Federal, State, Local not specified - PPO | Attending: Emergency Medicine | Admitting: Emergency Medicine

## 2014-10-20 DIAGNOSIS — Z87442 Personal history of urinary calculi: Secondary | ICD-10-CM | POA: Insufficient documentation

## 2014-10-20 DIAGNOSIS — Z86010 Personal history of colonic polyps: Secondary | ICD-10-CM | POA: Insufficient documentation

## 2014-10-20 DIAGNOSIS — E785 Hyperlipidemia, unspecified: Secondary | ICD-10-CM | POA: Insufficient documentation

## 2014-10-20 DIAGNOSIS — R011 Cardiac murmur, unspecified: Secondary | ICD-10-CM | POA: Diagnosis not present

## 2014-10-20 DIAGNOSIS — Z8709 Personal history of other diseases of the respiratory system: Secondary | ICD-10-CM | POA: Insufficient documentation

## 2014-10-20 DIAGNOSIS — K5900 Constipation, unspecified: Secondary | ICD-10-CM | POA: Diagnosis not present

## 2014-10-20 DIAGNOSIS — Z79899 Other long term (current) drug therapy: Secondary | ICD-10-CM | POA: Diagnosis not present

## 2014-10-20 DIAGNOSIS — Z8659 Personal history of other mental and behavioral disorders: Secondary | ICD-10-CM | POA: Insufficient documentation

## 2014-10-20 LAB — URINALYSIS, ROUTINE W REFLEX MICROSCOPIC
BILIRUBIN URINE: NEGATIVE
GLUCOSE, UA: NEGATIVE mg/dL
KETONES UR: NEGATIVE mg/dL
Leukocytes, UA: NEGATIVE
Nitrite: NEGATIVE
PH: 7 (ref 5.0–8.0)
PROTEIN: NEGATIVE mg/dL
Specific Gravity, Urine: 1.008 (ref 1.005–1.030)
Urobilinogen, UA: 0.2 mg/dL (ref 0.0–1.0)

## 2014-10-20 LAB — URINE MICROSCOPIC-ADD ON

## 2014-10-20 NOTE — ED Provider Notes (Signed)
CSN: [id number]     Arrival date & time 10/20/14  0919 History   First MD Initiated Contact with Patient 10/20/14 [id number]     Chief Complaint  Patient presents with  . Constipation     (Consider location/radiation/quality/duration/timing/severity/associated sxs/prior Treatment) Patient is a 64 y.o. male presenting with constipation. The history is provided by the patient.  Constipation Associated symptoms: abdominal pain   Associated symptoms: no back pain, no fever, no nausea and no vomiting    Patient status post bilateral inguinal hernia repairs on Tuesday mesh was placed. Patient also had trouble voiding post surgery so a Foley catheter in place to be removed tomorrow. Patient has not had a bowel movement since surgery. Patient has been on Percocet. Patient with discomfort and feels as if he is constipated. Patient trying Dulcolax and fleets enema at home without any help. Is here for relief of the constipation. No nausea no vomiting. No severe abdominal pain.  Past Medical History  Diagnosis Date  . Hyperlipidemia   . ADD (attention deficit disorder)   . BPH (benign prostatic hyperplasia)     has seen urologist  . ADJUSTMENT DISORDER 09/29/2007    Qualifier: Diagnosis of  By: Fabian Sharp MD, Neta Mends   . Rhinitis 12/31/2010  . Personal history of colonic adenoma 06/30/2008  . Inguinal hernia     bilateral   Past Surgical History  Procedure Laterality Date  . 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 Knee arthroscopy    . Cystoscopy    . Inguinal hernia repair Bilateral 10/18/2014    Procedure: LAPAROSCOPIC BILATERAL INGUINAL HERNIA REPAIR  ;  Surgeon: Claud Kelp, MD;  Location: [facility];  Service: General;  Laterality: Bilateral;  . Insertion of mesh Bilateral 10/18/2014    Procedure: INSERTION OF MESH;  Surgeon: Claud Kelp, MD;  Location: [facility];  Service: General;  Laterality: Bilateral;   Family History  Problem Relation Age of Onset  . Diabetes    . Other       CVA age 87  . Other      sleep problems  . Myasthenia gravis Brother     deceased  . Hyperlipidemia    . Alcohol abuse      Uncle   History  Substance Use Topics  . Smoking status: Never Smoker   . Smokeless tobacco: Not on file  . Alcohol Use: No     Comment: Hx of alcohol abuse - none since 2010    Review of Systems  Constitutional: Negative for fever.  HENT: Negative for congestion.   Eyes: Negative for redness.  Respiratory: Negative for shortness of breath.   Gastrointestinal: Positive for abdominal pain and constipation. Negative for nausea and vomiting.  Genitourinary: Negative for enuresis.  Musculoskeletal: Negative for back pain.  Skin: Negative for rash.  Neurological: Negative for headaches.  Hematological: Does not bruise/bleed easily.  Psychiatric/Behavioral: Negative for confusion.      Allergies  Review of patient's allergies indicates no known allergies.  Home Medications   Prior to Admission medications   Medication Sig Start Date End Date Taking? Authorizing Provider  acetaminophen (TYLENOL) 500 MG tablet Take 1,000 mg by mouth every 6 (six) hours as needed for mild pain or moderate pain.   Yes Historical Provider, MD  acidophilus (RISAQUAD) CAPS capsule Take 2 capsules by mouth daily.   Yes Historical Provider, MD  oxyCODONE-acetaminophen (PERCOCET) 7.5-325 MG per tablet Take 1 tablet by mouth every 4 (four)  hours as needed for pain. 10/18/14  Yes Claud KelpHaywood Ingram, MD  simvastatin (ZOCOR) 40 MG tablet take 1 tablet by mouth once daily 05/31/14  Yes Madelin HeadingsWanda K Panosh, MD   BP 150/84 mmHg  Pulse 84  Temp(Src) 99.1 F (37.3 C) (Oral)  Resp 16  SpO2 99% Physical Exam  Constitutional: He is oriented to person, place, and time. He appears well-developed and well-nourished. No distress.  HENT:  Head: Normocephalic and atraumatic.  Mouth/Throat: Oropharynx is clear and moist.  Eyes: Conjunctivae and EOM are normal. Pupils are equal, round, and reactive  to light.  Neck: Normal range of motion.  Cardiovascular: Normal rate and regular rhythm.   Murmur heard. Pulmonary/Chest: Effort normal and breath sounds normal.  Abdominal: Soft. Bowel sounds are normal. There is no tenderness.  Surgical wounds appear to be healing fine.  Musculoskeletal: Normal range of motion.  Neurological: He is alert and oriented to person, place, and time. No cranial nerve deficit. He exhibits normal muscle tone. Coordination normal.  Skin: Skin is warm. No rash noted.  Nursing note and vitals reviewed.   ED Course  Procedures (including critical care time) Labs Review Labs Reviewed  URINALYSIS, ROUTINE W REFLEX MICROSCOPIC - Abnormal; Notable for the following:    Hgb urine dipstick MODERATE (*)    All other components within normal limits  URINE MICROSCOPIC-ADD ON    Imaging Review Dg Abd Acute W/chest  10/20/2014   CLINICAL DATA:  Constipation for 2 days. Bilateral inguinal hernia repair 10/18/2014.  EXAM: ACUTE ABDOMEN SERIES (ABDOMEN 2 VIEW & CHEST 1 VIEW)  COMPARISON:  Chest x-ray 12/31/2010  FINDINGS: No convincing postoperative ileus or bowel obstruction. There is a moderate volume of formed stool, possible constipation. Mesh over both groins related to history of hernia repair. No pneumoperitoneum. No concerning intra-abdominal mass effect or calcification.  Normal heart size and mediastinal contours. No acute infiltrate or edema. No effusion or pneumothorax. No acute osseous findings.  IMPRESSION: 1. No evidence of postoperative ileus or obstruction. 2. Probable constipation.   Electronically Signed   By: Tiburcio PeaJonathan  Watts M.D.   On: 10/20/2014 10:23     EKG Interpretation None      MDM   Final diagnoses:  Constipation    Patient status post bilateral hernia repair inguinal hernia repair on Tuesday by [facility] [place] [facility]. Patient has been taking Percocet for the pain. Patient unable to have a bowel movement for the past 2 days and felt very  backed up. No nausea no vomiting. Plain films of the abdomen do confirm some constipation. Patient also had Foley catheter placed because he had trouble voiding problems after the procedure. I was to be removed tomorrow morning he will he completed to have it out today so we took it out one day early. Patient also did have a bowel movement here and feels much better. Patient will be sent home with the simple solutions for the constipation. Patient states he'll stop taking the Percocet is really needed for pain anymore.  Patient feels much better. Following the bowel movement. Patient advised to continue current treatments.  Vanetta MuldersScott Helder Crisafulli, MD 10/20/14 1218

## 2014-10-20 NOTE — ED Notes (Signed)
Pt states that he had surgery for inguinal hernia on Tuesday.  States that he has not had a BM since that morning.  States that he normally has 2-3 BMs a day.  Tried a couple of enemas with no relief.  Has foley catheter since surgery.  Supposed to have it removed tomorrow.

## 2014-10-20 NOTE — Discharge Instructions (Signed)
Constipation Constipation is when a person:  Poops (has a bowel movement) less than 3 times a week.  Has a hard time pooping.  Has poop that is dry, hard, or bigger than normal. HOME CARE   Eat foods with a lot of fiber in them. This includes fruits, vegetables, beans, and whole grains such as brown rice.  Avoid fatty foods and foods with a lot of sugar. This includes french fries, hamburgers, cookies, candy, and soda.  If you are not getting enough fiber from food, take products with added fiber in them (supplements).  Drink enough fluid to keep your pee (urine) clear or pale yellow.  Exercise on a regular basis, or as told by your doctor.  Go to the restroom when you feel like you need to poop. Do not hold it.  Only take medicine as told by your doctor. Do not take medicines that help you poop (laxatives) without talking to your doctor first. GET HELP RIGHT AWAY IF:   You have bright red blood in your poop (stool).  Your constipation lasts more than 4 days or gets worse.  You have belly (abdominal) or butt (rectal) pain.  You have thin poop (as thin as a pencil).  You lose weight, and it cannot be explained. MAKE SURE YOU:   Understand these instructions.  Will watch your condition.  Will get help right away if you are not doing well or get worse. Document Released: 02/19/2008 Document Revised: 09/07/2013 Document Reviewed: 06/14/2013 [facility] Patient Information 2015 [place], [place]. This information is not intended to replace advice given to you by your health care provider. Make sure you discuss any questions you have with your health care provider.  Would recommend going with the gentle solutions for the constipation. Drink plenty of fluids. Would consider Vear Clockhillips' milk of magnesia. If things get more difficult then magnesium citrate available at the drugstore would be appropriate. Also return if unable to void since we removed her Foley catheter today. Keep your  appointment and follow-up with her surgeon as directed.

## 2014-10-20 NOTE — ED Notes (Signed)
Patient transported to X-ray 

## 2015-01-31 ENCOUNTER — Other Ambulatory Visit: Payer: Self-pay | Admitting: Internal Medicine

## 2015-02-22 ENCOUNTER — Other Ambulatory Visit: Payer: Self-pay | Admitting: Internal Medicine

## 2015-02-22 NOTE — Telephone Encounter (Signed)
Denied.  Filled on 02/01/15 for 6 months.  Request is too early.

## 2015-04-21 ENCOUNTER — Ambulatory Visit (INDEPENDENT_AMBULATORY_CARE_PROVIDER_SITE_OTHER): Payer: Federal, State, Local not specified - PPO

## 2015-04-21 ENCOUNTER — Encounter: Payer: Self-pay | Admitting: Podiatry

## 2015-04-21 ENCOUNTER — Ambulatory Visit (INDEPENDENT_AMBULATORY_CARE_PROVIDER_SITE_OTHER): Payer: Federal, State, Local not specified - PPO | Admitting: Podiatry

## 2015-04-21 VITALS — BP 159/99 | HR 48 | Resp 12 | Ht 69.0 in | Wt 187.0 lb

## 2015-04-21 DIAGNOSIS — M722 Plantar fascial fibromatosis: Secondary | ICD-10-CM

## 2015-04-21 DIAGNOSIS — M79672 Pain in left foot: Secondary | ICD-10-CM

## 2015-04-21 MED ORDER — DICLOFENAC SODIUM 75 MG PO TBEC: 75.0000 mg | DELAYED_RELEASE_TABLET | Freq: Two times a day (BID) | ORAL | Status: DC

## 2015-04-21 MED ORDER — TRIAMCINOLONE ACETONIDE 10 MG/ML IJ SUSP
10.0000 mg | Freq: Once | INTRAMUSCULAR | Status: AC
  Administered 2015-04-21: 10 mg

## 2015-04-21 NOTE — Progress Notes (Signed)
   Subjective:    Patient ID: Randall Torres, male    DOB: 07-Feb-1951, 64 y.o.   MRN: [id number]  HPI  Patient presents here today for left heel and arch pain since 3 weeks ago, getting worse(did a lot of walking on vacation), started as tingling and now can hardly walk without shoes on. Treated with cold ice water bottle and rubbing foot on it, taking aleve as needed. It did help some.  Review of Systems  Genitourinary: Positive for frequency.       Objective:   Physical Exam        Assessment & Plan:

## 2015-04-21 NOTE — Patient Instructions (Signed)

## 2015-04-22 NOTE — Progress Notes (Signed)
Subjective:     Patient ID: Randall Torres, male   DOB: 1950-10-20, 64 y.o.   MRN: [id number]  HPI patient states I developed a lot of pain in my left heel that's worsened gradually and I did unfortunately just lose my wife and I was on my feet a lot   Review of Systems  All other systems reviewed and are negative.      Objective:   Physical Exam  Constitutional: He is oriented to person, place, and time.  Cardiovascular: Intact distal pulses.   Musculoskeletal: Normal range of motion.  Neurological: He is oriented to person, place, and time.  Skin: Skin is warm.  Nursing note and vitals reviewed.  neurovascular status found to be intact with muscle strength adequate range of motion within normal limits. Patient does have significant depression of the arch upon weightbearing and is found to have fluid buildup around the medial fascial band left at the insertional point of the tendon into the calcaneus     Assessment:     Acute plantar fasciitis with flattening the arch noted which is mechanical in nature and has been present for a long time    Plan:     H&P and x-rays reviewed with patient. Today I injected the left plantar fascia 3 mg Kenalog 5 mg Xylocaine and gave instructions on physical therapy dispensed fascial brace and discussed long-term orthotic therapy. Patient wants to have these Mabel we will make sure first that we get him out of the acute pain he experiences

## 2015-04-28 ENCOUNTER — Encounter: Payer: Self-pay | Admitting: Podiatry

## 2015-04-28 ENCOUNTER — Ambulatory Visit: Payer: Federal, State, Local not specified - PPO | Admitting: Podiatry

## 2015-04-28 ENCOUNTER — Ambulatory Visit (INDEPENDENT_AMBULATORY_CARE_PROVIDER_SITE_OTHER): Payer: Federal, State, Local not specified - PPO | Admitting: Podiatry

## 2015-04-28 VITALS — BP 158/98 | HR 54 | Resp 16

## 2015-04-28 DIAGNOSIS — M722 Plantar fascial fibromatosis: Secondary | ICD-10-CM

## 2015-04-28 DIAGNOSIS — M205X1 Other deformities of toe(s) (acquired), right foot: Secondary | ICD-10-CM

## 2015-04-28 MED ORDER — TRIAMCINOLONE ACETONIDE 10 MG/ML IJ SUSP
10.0000 mg | Freq: Once | INTRAMUSCULAR | Status: AC
Start: 2015-04-28 — End: 2015-04-28
  Administered 2015-04-28: 10 mg

## 2015-04-28 NOTE — Progress Notes (Signed)
Subjective:     Patient ID: Randall Torres, male   DOB: 1951-05-26, 64 y.o.   MRN: [id number]  HPI patient states that it is improved but still sore with one area still being quite tender   Review of Systems     Objective:   Physical Exam Neurovascular status intact muscle strength adequate with discomfort in the plantar aspect of the left heel still noted in the center of the heel with the medial heel doing well    Assessment:     Plantar fasciitis left still noted with moderate depression of the arch also noted    Plan:     Reviewed condition and recommended at this time continued injection therapy which was accomplished with 3 mg Kenalog 5 mg Xylocaine and then scanned for custom orthotics to reduce stress against the heel

## 2015-05-05 ENCOUNTER — Other Ambulatory Visit (INDEPENDENT_AMBULATORY_CARE_PROVIDER_SITE_OTHER): Payer: Federal, State, Local not specified - PPO

## 2015-05-05 DIAGNOSIS — Z0000 Encounter for general adult medical examination without abnormal findings: Secondary | ICD-10-CM | POA: Diagnosis not present

## 2015-05-05 LAB — HEPATIC FUNCTION PANEL
ALK PHOS: 51 U/L (ref 39–117)
ALT: 18 U/L (ref 0–53)
AST: 22 U/L (ref 0–37)
Albumin: 4 g/dL (ref 3.5–5.2)
Bilirubin, Direct: 0.2 mg/dL (ref 0.0–0.3)
TOTAL PROTEIN: 6.9 g/dL (ref 6.0–8.3)
Total Bilirubin: 0.6 mg/dL (ref 0.2–1.2)

## 2015-05-05 LAB — CBC WITH DIFFERENTIAL/PLATELET
BASOS ABS: 0 10*3/uL (ref 0.0–0.1)
BASOS PCT: 0.7 % (ref 0.0–3.0)
EOS ABS: 0.3 10*3/uL (ref 0.0–0.7)
Eosinophils Relative: 5.7 % — ABNORMAL HIGH (ref 0.0–5.0)
HCT: 41.6 % (ref 39.0–52.0)
HEMOGLOBIN: 14 g/dL (ref 13.0–17.0)
Lymphocytes Relative: 38.2 % (ref 12.0–46.0)
Lymphs Abs: 2.2 10*3/uL (ref 0.7–4.0)
MCHC: 33.7 g/dL (ref 30.0–36.0)
MCV: 89.5 fl (ref 78.0–100.0)
Monocytes Absolute: 0.4 10*3/uL (ref 0.1–1.0)
Monocytes Relative: 6.4 % (ref 3.0–12.0)
Neutro Abs: 2.9 10*3/uL (ref 1.4–7.7)
Neutrophils Relative %: 49 % (ref 43.0–77.0)
Platelets: 259 10*3/uL (ref 150.0–400.0)
RBC: 4.65 Mil/uL (ref 4.22–5.81)
RDW: 13.5 % (ref 11.5–15.5)
WBC: 5.9 10*3/uL (ref 4.0–10.5)

## 2015-05-05 LAB — LIPID PANEL
Cholesterol: 150 mg/dL (ref 0–200)
HDL: 52 mg/dL (ref >39.00)
LDL CALC: 90 mg/dL (ref 0–99)
NONHDL: 98.19
Total CHOL/HDL Ratio: 3
Triglycerides: 41 mg/dL (ref 0.0–149.0)
VLDL: 8.2 mg/dL (ref 0.0–40.0)

## 2015-05-05 LAB — BASIC METABOLIC PANEL
BUN: 22 mg/dL (ref 6–23)
CO2: 30 mEq/L (ref 19–32)
CREATININE: 0.84 mg/dL (ref 0.40–1.50)
Calcium: 9 mg/dL (ref 8.4–10.5)
Chloride: 104 mEq/L (ref 96–112)
GFR: 97.24 mL/min (ref >60.00)
Glucose, Bld: 88 mg/dL (ref 70–99)
POTASSIUM: 4.6 meq/L (ref 3.5–5.1)
Sodium: 140 mEq/L (ref 135–145)

## 2015-05-05 LAB — TSH: TSH: 2.09 u[IU]/mL (ref 0.35–4.50)

## 2015-05-05 LAB — PSA: PSA: 0.71 ng/mL (ref 0.10–4.00)

## 2015-05-12 ENCOUNTER — Ambulatory Visit (INDEPENDENT_AMBULATORY_CARE_PROVIDER_SITE_OTHER): Payer: Federal, State, Local not specified - PPO | Admitting: Internal Medicine

## 2015-05-12 ENCOUNTER — Encounter: Payer: Self-pay | Admitting: Internal Medicine

## 2015-05-12 VITALS — BP 148/88 | Temp 98.5°F | Ht 68.5 in | Wt 189.0 lb

## 2015-05-12 DIAGNOSIS — M722 Plantar fascial fibromatosis: Secondary | ICD-10-CM | POA: Diagnosis not present

## 2015-05-12 DIAGNOSIS — E785 Hyperlipidemia, unspecified: Secondary | ICD-10-CM | POA: Diagnosis not present

## 2015-05-12 DIAGNOSIS — Z0000 Encounter for general adult medical examination without abnormal findings: Secondary | ICD-10-CM | POA: Diagnosis not present

## 2015-05-12 DIAGNOSIS — Z23 Encounter for immunization: Secondary | ICD-10-CM | POA: Diagnosis not present

## 2015-05-12 DIAGNOSIS — IMO0001 Reserved for inherently not codable concepts without codable children: Secondary | ICD-10-CM

## 2015-05-12 DIAGNOSIS — R030 Elevated blood-pressure reading, without diagnosis of hypertension: Secondary | ICD-10-CM

## 2015-05-12 NOTE — Progress Notes (Signed)
Pre visit review using our clinic review tool, if applicable. No additional management support is needed unless otherwise documented below in the visit note.  Chief Complaint  Patient presents with  . Annual Exam    HPI: Patient  Randall Torres  64 y.o. comes in today for Preventive Health Care visit  Followed for elevaetd lipids   Recently [place]  And had heel pain dx PF and under care  sterois inserts and strapping   Can still swim  bp up  [place] [facility].  Hasn't  checked at home .   Health Maintenance  Topic Date Due  . Hepatitis C Screening  06-04-1951  . INFLUENZA VACCINE  04/17/2015  . HIV Screening  05/13/2016 (Originally 07/30/1964)  . PNA vac Low Risk Adult (2 of 2 - PPSV23) 07/21/2016  . COLONOSCOPY  07/30/2023  . TETANUS/TDAP  12/05/2024  . ZOSTAVAX  Completed   Health Maintenance Review LIFESTYLE:  Exercise:  Yes swim Tobacco/ETS:no Alcohol: per day no Sugar beverages:no Sleep:  About   Is good  6-7   Drug use: no  ROS:  See hpi  GEN/ HEENT: No fever, significant weight changes sweats headaches vision problems hearing changes, CV/ PULM; No chest pain shortness of breath cough, syncope,edema  change in exercise tolerance. GI /GU: No adominal pain, vomiting, change in bowel habits. No blood in the stool. No significant GU symptoms. SKIN/HEME: ,no acute skin rashes suspicious lesions or bleeding. No lymphadenopathy, nodules, masses.  NEURO/ PSYCH:  No new  neurologic signs such as weakness numbness. No depression anxiety. See IMM/ Allergy: No unusual infections.  Allergy .   REST of 12 system review negative except as per HPI   Past Medical History  Diagnosis Date  . Hyperlipidemia   . ADD (attention deficit disorder)   . BPH (benign prostatic hyperplasia)     has seen urologist  . ADJUSTMENT DISORDER 09/29/2007    Qualifier: Diagnosis of  By: Fabian Sharp MD, Neta Mends   . Rhinitis 12/31/2010  . Personal history of colonic adenoma 06/30/2008  .  Inguinal hernia     bilateral    Past Surgical History  Procedure Laterality Date  . 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 Knee arthroscopy    . Cystoscopy    . Inguinal hernia repair Bilateral 10/18/2014    Procedure: LAPAROSCOPIC BILATERAL INGUINAL HERNIA REPAIR  ;  Surgeon: Claud Kelp, MD;  Location: [facility];  Service: General;  Laterality: Bilateral;  . Insertion of mesh Bilateral 10/18/2014    Procedure: INSERTION OF MESH;  Surgeon: Claud Kelp, MD;  Location: [facility];  Service: General;  Laterality: Bilateral;    Family History  Problem Relation Age of Onset  . Diabetes    . Other      CVA age 62  . Other      sleep problems  . Myasthenia gravis Brother     deceased  . Hyperlipidemia    . Alcohol abuse      Uncle    Social History   Social History  . Marital Status: Married    Spouse Name: N/A  . Number of Children: N/A  . Years of Education: N/A   Social History Main Topics  . Smoking status: Never Smoker   . Smokeless tobacco: Never Used  . Alcohol Use: No     Comment: Hx of alcohol abuse - none since 2010  . Drug Use: No  . Sexual  Activity: Not Asked   Other Topics Concern  . None   Social History Narrative   Occupation: Publishing rights manager.   Married   Exercise: Swims daily  5-6 days per week.    Goes to AA  5 day per week or so.      HH of 2 dog     Wife recent dx and rx for breast cancer     Outpatient Prescriptions Prior to Visit  Medication Sig Dispense Refill  . diclofenac (VOLTAREN) 75 MG EC tablet Take 1 tablet (75 mg total) by mouth 2 (two) times daily. 50 tablet 2  . Naproxen Sodium (ALEVE PO) Take by mouth. As needed    . simvastatin (ZOCOR) 40 MG tablet take 1 tablet by mouth once daily 90 tablet 1   No facility-administered medications prior to visit.     EXAM:  BP 148/88 mmHg  Temp(Src) 98.5 F (36.9 C) (Oral)  Ht 5' 8.5" (1.74 m)  Wt 189 lb (85.73 kg)  BMI 28.32 kg/m2  Body mass index is 28.32  kg/(m^2).  Physical Exam: Vital signs reviewed ZOX:WRUE is a well-developed well-nourished alert cooperative    who appearsr stated age in no acute distress.  HEENT: normocephalic atraumatic , Eyes: PERRL EOM's full, conjunctiva clear, Nares: paten,t no deformity discharge or tenderness., Ears: no deformity EAC's clear TMs with normal landmarks. Mouth: clear OP, no lesions, edema.  Moist mucous membranes. Dentition in adequate repair. NECK: supple without masses, thyromegaly or bruits. CHEST/PULM:  Clear to auscultation and percussion breath sounds equal no wheeze , rales or rhonchi. No chest wall deformities or tenderness. CV: PMI is nondisplaced, S1 S2 no gallops, murmurs, rubs. Peripheral pulses are full without delay.No JVD .  ABDOMEN: Bowel sounds normal nontender  No guard or rebound, no hepato splenomegal no CVA tenderness.  No hernia. Extremtities:  No clubbing cyanosis or edema, no acute joint swelling or redness no focal atrophy NEURO:  Oriented x3, cranial nerves 3-12 appear to be intact, no obvious focal weakness,gait within normal limits no abnormal reflexes or asymmetrical SKIN: No acute rashes normal turgor, color, no bruising or petechiae. Toenail thickening  PSYCH: Oriented, good eye contact, no obvious depression anxiety, cognition and judgment appear normal. LN: no cervical axillary inguinal adenopathy  Lab Results  Component Value Date   WBC 5.9 05/05/2015   HGB 14.0 05/05/2015   HCT 41.6 05/05/2015   PLT 259.0 05/05/2015   GLUCOSE 88 05/05/2015   CHOL 150 05/05/2015   TRIG 41.0 05/05/2015   HDL 52.00 05/05/2015   LDLDIRECT 147.2 11/29/2009   LDLCALC 90 05/05/2015   ALT 18 05/05/2015   AST 22 05/05/2015   NA 140 05/05/2015   K 4.6 05/05/2015   CL 104 05/05/2015   CREATININE 0.84 05/05/2015   BUN 22 05/05/2015   CO2 30 05/05/2015   TSH 2.09 05/05/2015   PSA 0.71 05/05/2015   HGBA1C 5.8 12/30/2007   BP Readings from Last 3 Encounters:  05/12/15 148/88    04/28/15 158/98  04/21/15 159/99   Wt Readings from Last 3 Encounters:  05/12/15 189 lb (85.73 kg)  04/21/15 187 lb (84.823 kg)  10/18/14 194 lb (87.998 kg)    ASSESSMENT AND PLAN:  Discussed the following assessment and plan:  Visit for preventive health examination  Hyperlipidemia - continue   good control  Elevated BP - check at home  may have wc effec if ongoing ov to intervent has healthy ls otherwise   Plantar fasciitis  Encounter  for immunization  reviewed   Patient Care Team: Madelin Headings, MD as PCP - General Su Grand, MD as Attending Physician (Urology) Patient Instructions  Continue lifestyle intervention healthy eating and exercise .  Take blood pressure readings twice a day for 10 -14 days and then periodically .To ensure below 140/90   .Send in readings  ( can use my chart)   . Goal below 140/90 .   If they are continually elevated  Then plan fu visit to discuss medication . ( sometimes  nsiads can elevate blood pressure )  Suggest ice at night stretche before bed and first thing in am .  No bare feet.   Plantar Fasciitis Plantar fasciitis is a common condition that causes foot pain. It is soreness (inflammation) of the band of tough fibrous tissue on the bottom of the foot that runs from the heel bone (calcaneus) to the ball of the foot. The cause of this soreness may be from excessive standing, poor fitting shoes, running on hard surfaces, being overweight, having an abnormal walk, or overuse (this is common in runners) of the painful foot or feet. It is also common in aerobic exercise dancers and ballet dancers. SYMPTOMS  Most people with plantar fasciitis complain of:  Severe pain in the morning on the bottom of their foot especially when taking the first steps out of bed. This pain recedes after a few minutes of walking.  Severe pain is experienced also during walking following a long period of inactivity.  Pain is worse when walking barefoot or up  stairs DIAGNOSIS   Your caregiver will diagnose this condition by examining and feeling your foot.  Special tests such as X-rays of your foot, are usually not needed. PREVENTION   Consult a sports medicine professional before beginning a new exercise program.  Walking programs offer a good workout. With walking there is a lower chance of overuse injuries common to runners. There is less impact and less jarring of the joints.  Begin all new exercise programs slowly. If problems or pain develop, decrease the amount of time or distance until you are at a comfortable level.  Wear good shoes and replace them regularly.  Stretch your foot and the heel cords at the back of the ankle (Achilles tendon) both before and after exercise.  Run or exercise on even surfaces that are not hard. For example, asphalt is better than pavement.  Do not run barefoot on hard surfaces.  If using a treadmill, vary the incline.  Do not continue to workout if you have foot or joint problems. Seek professional help if they do not improve. HOME CARE INSTRUCTIONS   Avoid activities that cause you pain until you recover.  Use ice or cold packs on the problem or painful areas after working out.  Only take over-the-counter or prescription medicines for pain, discomfort, or fever as directed by your caregiver.  Soft shoe inserts or athletic shoes with air or gel sole cushions may be helpful.  If problems continue or become more severe, consult a sports medicine caregiver or your own health care provider. Cortisone is a potent anti-inflammatory medication that may be injected into the painful area. You can discuss this treatment with your caregiver. MAKE SURE YOU:   Understand these instructions.  Will watch your condition.  Will get help right away if you are not doing well or get worse. Document Released: 05/28/2001 Document Revised: 11/25/2011 Document Reviewed: 07/27/2008 [facility] Patient Information  2015 [place], [place]. This  information is not intended to replace advice given to you by your health care provider. Make sure you discuss any questions you have with your health care provider.     Neta Mends. Panosh M.D.

## 2015-05-12 NOTE — Patient Instructions (Signed)
Continue lifestyle intervention healthy eating and exercise .  Take blood pressure readings twice a day for 10 -14 days and then periodically .To ensure below 140/90   .Send in readings  ( can use my chart)   . Goal below 140/90 .   If they are continually elevated  Then plan fu visit to discuss medication . ( sometimes  nsiads can elevate blood pressure )  Suggest ice at night stretche before bed and first thing in am .  No bare feet.   Plantar Fasciitis Plantar fasciitis is a common condition that causes foot pain. It is soreness (inflammation) of the band of tough fibrous tissue on the bottom of the foot that runs from the heel bone (calcaneus) to the ball of the foot. The cause of this soreness may be from excessive standing, poor fitting shoes, running on hard surfaces, being overweight, having an abnormal walk, or overuse (this is common in runners) of the painful foot or feet. It is also common in aerobic exercise dancers and ballet dancers. SYMPTOMS  Most people with plantar fasciitis complain of:  Severe pain in the morning on the bottom of their foot especially when taking the first steps out of bed. This pain recedes after a few minutes of walking.  Severe pain is experienced also during walking following a long period of inactivity.  Pain is worse when walking barefoot or up stairs DIAGNOSIS   Your caregiver will diagnose this condition by examining and feeling your foot.  Special tests such as X-rays of your foot, are usually not needed. PREVENTION   Consult a sports medicine professional before beginning a new exercise program.  Walking programs offer a good workout. With walking there is a lower chance of overuse injuries common to runners. There is less impact and less jarring of the joints.  Begin all new exercise programs slowly. If problems or pain develop, decrease the amount of time or distance until you are at a comfortable level.  Wear good shoes and replace them  regularly.  Stretch your foot and the heel cords at the back of the ankle (Achilles tendon) both before and after exercise.  Run or exercise on even surfaces that are not hard. For example, asphalt is better than pavement.  Do not run barefoot on hard surfaces.  If using a treadmill, vary the incline.  Do not continue to workout if you have foot or joint problems. Seek professional help if they do not improve. HOME CARE INSTRUCTIONS   Avoid activities that cause you pain until you recover.  Use ice or cold packs on the problem or painful areas after working out.  Only take over-the-counter or prescription medicines for pain, discomfort, or fever as directed by your caregiver.  Soft shoe inserts or athletic shoes with air or gel sole cushions may be helpful.  If problems continue or become more severe, consult a sports medicine caregiver or your own health care provider. Cortisone is a potent anti-inflammatory medication that may be injected into the painful area. You can discuss this treatment with your caregiver. MAKE SURE YOU:   Understand these instructions.  Will watch your condition.  Will get help right away if you are not doing well or get worse. Document Released: 05/28/2001 Document Revised: 11/25/2011 Document Reviewed: 07/27/2008 [facility] Patient Information 2015 [place], [place]. This information is not intended to replace advice given to you by your health care provider. Make sure you discuss any questions you have with your health care provider.

## 2015-05-26 ENCOUNTER — Ambulatory Visit: Payer: Federal, State, Local not specified - PPO | Admitting: *Deleted

## 2015-05-26 DIAGNOSIS — M722 Plantar fascial fibromatosis: Secondary | ICD-10-CM

## 2015-05-26 NOTE — Patient Instructions (Signed)

## 2015-05-26 NOTE — Progress Notes (Signed)
Patient ID: Randall Torres, male   DOB: 1951-03-31, 64 y.o.   MRN: [id number] Patient presents for orthotic pick up.  Verbal and written break in and wear instructions given.  Patient will follow up in 4 weeks if symptoms worsen or fail to improve.

## 2015-05-30 ENCOUNTER — Encounter: Payer: Self-pay | Admitting: Internal Medicine

## 2015-05-30 ENCOUNTER — Encounter: Payer: Self-pay | Admitting: Podiatry

## 2015-06-11 NOTE — Telephone Encounter (Signed)
Readings are  Indeed   Borderline elevated diastolic BP .  Systolic is ok. We can go either way .  Continue lifestylehealthy eating and exercise . Send in another  7-10 days of readings in about  3-4 months  .  If bottom number still up .   We can add low dose medication.

## 2015-09-26 ENCOUNTER — Other Ambulatory Visit: Payer: Self-pay

## 2015-09-26 ENCOUNTER — Other Ambulatory Visit: Payer: Self-pay | Admitting: Family Medicine

## 2015-09-26 MED ORDER — SIMVASTATIN 40 MG PO TABS: 40.0000 mg | ORAL_TABLET | Freq: Every day | ORAL | Status: DC

## 2015-09-26 NOTE — Telephone Encounter (Signed)
Sent to the pharmacy by e-scribe. 

## 2015-12-06 ENCOUNTER — Telehealth: Payer: Self-pay | Admitting: Internal Medicine

## 2015-12-06 NOTE — Telephone Encounter (Signed)
Pt states his grandson is 658 months old, and [place]. He has a cough and pt was wondering if you would see him today?  Advised pt we normally do not see pts that are not established. Pt wanted me to ask anyway.

## 2015-12-06 NOTE — Telephone Encounter (Signed)
If no fever than ok for 3 PM on Friday If fever than visit urgent care/ED

## 2015-12-06 NOTE — Telephone Encounter (Signed)
They will take that appointment and call back and cancel if they go somewhere else sooner.

## 2015-12-08 ENCOUNTER — Ambulatory Visit: Payer: Federal, State, Local not specified - PPO | Admitting: Internal Medicine

## 2016-01-01 DIAGNOSIS — K080 Exfoliation of teeth due to systemic causes: Secondary | ICD-10-CM | POA: Diagnosis not present

## 2016-01-02 DIAGNOSIS — F332 Major depressive disorder, recurrent severe without psychotic features: Secondary | ICD-10-CM | POA: Diagnosis not present

## 2016-01-09 DIAGNOSIS — F332 Major depressive disorder, recurrent severe without psychotic features: Secondary | ICD-10-CM | POA: Diagnosis not present

## 2016-01-24 DIAGNOSIS — F332 Major depressive disorder, recurrent severe without psychotic features: Secondary | ICD-10-CM | POA: Diagnosis not present

## 2016-02-07 DIAGNOSIS — F332 Major depressive disorder, recurrent severe without psychotic features: Secondary | ICD-10-CM | POA: Diagnosis not present

## 2016-03-07 DIAGNOSIS — F332 Major depressive disorder, recurrent severe without psychotic features: Secondary | ICD-10-CM | POA: Diagnosis not present

## 2016-04-19 ENCOUNTER — Other Ambulatory Visit: Payer: Self-pay | Admitting: Internal Medicine

## 2016-04-23 NOTE — Telephone Encounter (Signed)
Sent to the pharmacy by e-scribe.  Pt has upcoming appt on 05/22/16.

## 2016-04-24 DIAGNOSIS — F332 Major depressive disorder, recurrent severe without psychotic features: Secondary | ICD-10-CM | POA: Diagnosis not present

## 2016-05-03 ENCOUNTER — Other Ambulatory Visit (INDEPENDENT_AMBULATORY_CARE_PROVIDER_SITE_OTHER): Payer: Federal, State, Local not specified - PPO

## 2016-05-03 ENCOUNTER — Telehealth: Payer: Self-pay | Admitting: Internal Medicine

## 2016-05-03 ENCOUNTER — Encounter: Payer: Self-pay | Admitting: Podiatry

## 2016-05-03 ENCOUNTER — Ambulatory Visit (INDEPENDENT_AMBULATORY_CARE_PROVIDER_SITE_OTHER): Payer: Federal, State, Local not specified - PPO | Admitting: Podiatry

## 2016-05-03 ENCOUNTER — Ambulatory Visit (INDEPENDENT_AMBULATORY_CARE_PROVIDER_SITE_OTHER): Payer: Federal, State, Local not specified - PPO | Admitting: Family

## 2016-05-03 ENCOUNTER — Encounter: Payer: Self-pay | Admitting: Family

## 2016-05-03 VITALS — BP 135/78 | HR 59 | Resp 12

## 2016-05-03 DIAGNOSIS — M21372 Foot drop, left foot: Secondary | ICD-10-CM

## 2016-05-03 DIAGNOSIS — G629 Polyneuropathy, unspecified: Secondary | ICD-10-CM | POA: Diagnosis not present

## 2016-05-03 DIAGNOSIS — M779 Enthesopathy, unspecified: Secondary | ICD-10-CM

## 2016-05-03 LAB — B12 AND FOLATE PANEL
FOLATE: 11.4 ng/mL (ref >5.9)
VITAMIN B 12: 234 pg/mL (ref 211–911)

## 2016-05-03 MED ORDER — GABAPENTIN 100 MG PO CAPS: 100.0000 mg | ORAL_CAPSULE | Freq: Every day | ORAL | 0 refills | Status: DC

## 2016-05-03 NOTE — Telephone Encounter (Signed)
°  Pt came in to say that Dr Charlsie Merlesegal a podiatrist want Dr Fabian SharpPanosh to order pt a MRI on his back to rule out a compression.  He would like to have this done  As soon as possible.

## 2016-05-03 NOTE — Telephone Encounter (Signed)
Pt scheduled  

## 2016-05-03 NOTE — Progress Notes (Signed)
Subjective:    Patient ID: Randall Torres, male    DOB: Apr 12, 1951, 65 y.o.   MRN: [id number]  Chief Complaint  Patient presents with  . Establish Care    left foot has pain and he is getting to where he can not lift it or rotate it properly    HPI:  Randall LoserMichael F Likes is a 65 y.o. male who  has a past medical history of ADD (attention deficit disorder); ADJUSTMENT DISORDER (09/29/2007); BPH (benign prostatic hyperplasia); Hyperlipidemia; Inguinal hernia; Personal history of colonic adenoma (06/30/2008); and Rhinitis (12/31/2010). and presents today for an office visit.   Previous history of plantar fasciitis with the associated symptom of numbness and tingling feeling in his left foot and is having decreased movement. Numbness and tingling is primarily located in the bottom of his foot with differing sensations on the dorsum of his foot. Denies any modifying factors that make it better or worse. Severity is gradually worsening now effecting his gait and resulting in a fall.   No Known Allergies   Outpatient Medications Prior to Visit  Medication Sig Dispense Refill  . Naproxen Sodium (ALEVE PO) Take by mouth. As needed    . simvastatin (ZOCOR) 40 MG tablet take 1 tablet by mouth once daily 90 tablet 0  . diclofenac (VOLTAREN) 75 MG EC tablet Take 1 tablet (75 mg total) by mouth 2 (two) times daily. 50 tablet 2   No facility-administered medications prior to visit.      Past Medical History:  Diagnosis Date  . ADD (attention deficit disorder)   . ADJUSTMENT DISORDER 09/29/2007   Qualifier: Diagnosis of  By: Fabian SharpPanosh MD, Neta MendsWanda K   . BPH (benign prostatic hyperplasia)    has seen urologist  . Hyperlipidemia   . Inguinal hernia    bilateral  . Personal history of colonic adenoma 06/30/2008  . Rhinitis 12/31/2010     Past Surgical History:  Procedure Laterality Date  . CYSTOSCOPY    . INGUINAL HERNIA REPAIR Bilateral 10/18/2014   Procedure: LAPAROSCOPIC BILATERAL INGUINAL  HERNIA REPAIR  ;  Surgeon: Claud KelpHaywood Ingram, MD;  Location: [facility];  Service: General;  Laterality: Bilateral;  . INSERTION OF MESH Bilateral 10/18/2014   Procedure: INSERTION OF MESH;  Surgeon: Claud KelpHaywood Ingram, MD;  Location: [facility];  Service: General;  Laterality: Bilateral;  . KNEE ARTHROSCOPY    . TOTAL KNEE ARTHROPLASTY  11/08   medial compartmental      Family History  Problem Relation Age of Onset  . Diabetes    . Other      CVA age 67  . Other      sleep problems  . Myasthenia gravis Brother     deceased  . Hyperlipidemia    . Alcohol abuse      Uncle     Social History   Social History  . Marital status: Married    Spouse name: N/A  . Number of children: N/A  . Years of education: N/A   Occupational History  . Not on file.   Social History Main Topics  . Smoking status: Never Smoker  . Smokeless tobacco: Never Used  . Alcohol use No     Comment: Hx of alcohol abuse - none since 2010  . Drug use: No  . Sexual activity: Not on file   Other Topics Concern  . 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   Married   Exercise: Swims  daily  5-6 days per week.    Goes to AA  5 day per week or so.      HH of 2 dog     Wife recent dx and rx for breast cancer      Review of Systems  Constitutional: Negative for chills and fever.  Respiratory: Negative for chest tightness and shortness of breath.   Musculoskeletal:       Positive for decreased foot range of motion.  Neurological: Positive for numbness. Negative for weakness.      Objective:    BP 128/86 (BP Location: Left Arm, Patient Position: Sitting, Cuff Size: Normal)   Pulse (!) 54   Temp 98.3 F (36.8 C) (Oral)   Resp 16   Ht 5' 8.5" (1.74 m)   Wt 166 lb (75.3 kg)   SpO2 96%   BMI 24.87 kg/m  Nursing note and vital signs reviewed.  Physical Exam  Constitutional: He is oriented to person, place, and time. He appears well-developed and well-nourished. No distress.    Cardiovascular: Normal rate, regular rhythm, normal heart sounds and intact distal pulses.   Pulmonary/Chest: Effort normal and breath sounds normal.  Musculoskeletal:  Bilateral feet - bunion noted on right foot. No other deformity, discoloration, or edema noted. Appears pes cavus. There is decreased sensation to monofilament greater on the left than on the right along the plantar and dorsi aspect of the foot. Range of motion is decreased on the left with limited dorsiflexion. Strength is normal otherwise. Pulses and capillary refill are intact and appropriate.  Neurological: He is alert and oriented to person, place, and time.  Skin: Skin is warm and dry.  Psychiatric: He has a normal mood and affect. His behavior is normal. Judgment and thought content normal.       Assessment & Plan:   Problem List Items Addressed This Visit      Nervous and Auditory   Neuropathy (HCC)    Symptoms of neuropathy and decreased dorsiflexion with apparent dropfoot with no significant trauma. Obtain B12 and Folate as he does have significant history of alcohol abuse although in remission.. Trial gabapentin. Recommend starting alpha-lipoic acid and benfotiamine to help with neuropathy. Referral to neurology placed for nerve conduction testing if indicated. Recommend follow up with podiatry as needed.       Relevant Medications   gabapentin (NEURONTIN) 100 MG capsule   Other Relevant Orders   Ambulatory referral to Neurology   B12 and Folate Panel    Other Visit Diagnoses   None.      I have discontinued Mr. Edison SimonJoseph's diclofenac. I am also having him start on gabapentin. Additionally, I am having him maintain his Naproxen Sodium (ALEVE PO), simvastatin, and sertraline.   Meds ordered this encounter  Medications  . sertraline (ZOLOFT) 100 MG tablet    Sig: Take 100 mg by mouth daily.  Marland Kitchen. gabapentin (NEURONTIN) 100 MG capsule    Sig: Take 1 capsule (100 mg total) by mouth at bedtime.    Dispense:   30 capsule    Refill:  0    Order Specific Question:   Supervising Provider    Answer:   Hillard DankerRAWFORD, ELIZABETH A [[id number]]     Follow-up: Return if symptoms worsen or fail to improve.  Jeanine Luzalone, Gregory, FNP

## 2016-05-03 NOTE — Telephone Encounter (Signed)
Needs OV to  Evaluate cannot order that  test without  Evaluation.  Sounds like needs neurology evaluation .   I see that he saw  That he has already been referred to neuro   By Sharman CheekG Calone.  Please facilitate referral?   I can see him   Monday if that will help .

## 2016-05-03 NOTE — Patient Instructions (Addendum)
Thank you for choosing [facility].  Summary/Instructions:  They will call to schedule your appointment with neurology.  Please start taking the gabapentin nightly.  May consider 600 mg of Alpha-lipoic acid and 300 mg of benfotiamine.  Your prescription(s) have been submitted to your pharmacy or been printed and provided for you. Please take as directed and contact our office if you believe you are having problem(s) with the medication(s) or have any questions.  Please stop by the lab on the lower level of the building for your blood work. Your results will be released to MyChart (or called to you) after review, usually within 72 hours after test completion. If any changes need to be made, you will be notified at that same time.  1. The lab is open from 7:30am to 5:30 pm Monday-Friday  2. No appointment is necessary  3. Fasting (if needed) is 6-8 hours after food and drink; black  coffee and water are okay   Referrals have been made during this visit. You should expect to hear back from our schedulers in about 7-10 days in regards to establishing an appointment with the specialists we discussed.   If your symptoms worsen or fail to improve, please contact our office for further instruction, or in case of emergency go directly to the emergency room at the closest medical facility.

## 2016-05-03 NOTE — Progress Notes (Signed)
Subjective:     Patient ID: Randall Torres, male   DOB: 1951/04/19, 65 y.o.   MRN: [id number]  HPI patient presents stating he had some tingling in his heel for around a year and recently he has over the last 4 days developed what appears to be a foot drop and feels like he cannot keep his foot up   Review of Systems     Objective:   Physical Exam Neurovascular status was found to be intact with what appears to be loss of function of the extensor torn longus brevis group and moderate dysfunction of the anterior tibial tendon left with definite reduction inability to lift the foot upon testing in comparison to right foot    Assessment:     Possibility for a nerve compression within the back versus possibility of other neurological condition versus spontaneous loss of function of the tendon group with no apparent cause    Plan:     I do think he should have an MRI of his lower back to rule out a compression and then he is to see a neurologist. I'm going to send a note to his family physician to order an MRI for him and I've encouraged him to get that done soon and then if everything comes out fine I will go ahead and make him an AFO brace

## 2016-05-03 NOTE — Assessment & Plan Note (Addendum)
Symptoms of neuropathy and decreased dorsiflexion with apparent dropfoot with no significant trauma. Obtain B12 and Folate as he does have significant history of alcohol abuse although in remission.. Trial gabapentin. Recommend starting alpha-lipoic acid and benfotiamine to help with neuropathy. Referral to neurology placed for nerve conduction testing if indicated. Recommend follow up with podiatry as needed.

## 2016-05-03 NOTE — Progress Notes (Signed)
Pre visit review using our clinic review tool, if applicable. No additional management support is needed unless otherwise documented below in the visit note.  Chief Complaint  Patient presents with  . Follow-up    HPI: Randall Torres 65 y.o. comes in today related to weakness in his left foot. Here with Wife.  He has had some tingling feeling in the bottom of his left heel for quite a while but then the last 4 days has had where he could not dorsiflex his foot or his toes very well and actually had a fall when he turned around and his foot tripped hit the back of his head with no loss of consciousness.    [facility] laboratory studies were done and a neurology referral was made. Denies further injury has had left knee surgery doesn't really cross his legs much has no back pain bowel bladder dysfunction reported. Podiatrist said he should have an MRI and come back for an AFO. This appointment was made in regard to that.  He has had some weight loss in the spring and has had recurrent depression seeing psychiatrist on different medicines is now switched over to sertraline 100 mg. He has no other systemic symptoms night sweats. He is worried about the situation. ROS: See pertinent positives and negatives per HPI.  Past Medical History:  Diagnosis Date  . ADD (attention deficit disorder)   . ADJUSTMENT DISORDER 09/29/2007   Qualifier: Diagnosis of  By: Fabian SharpPanosh MD, Neta MendsWanda K   . BPH (benign prostatic hyperplasia)    has seen urologist  . Hyperlipidemia   . Inguinal hernia    bilateral  . Personal history of colonic adenoma 06/30/2008  . Rhinitis 12/31/2010    Family History  Problem Relation Age of Onset  . Diabetes    . Other      CVA age 67  . Other      sleep problems  . Myasthenia gravis Brother     deceased  . Hyperlipidemia    . Alcohol abuse      Uncle    Social History   Social History  . Marital status:  Married    Spouse name: N/A  . Number of children: N/A  . Years of education: N/A   Social History Main Topics  . Smoking status: Never Smoker  . Smokeless tobacco: Never Used  . Alcohol use No     Comment: Hx of alcohol abuse - none since 2010  . Drug use: No  . Sexual activity: Not Asked   Other Topics Concern  . 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   Married   Exercise: Swims daily  5-6 days per week.    Goes to AA  5 day per week or so.      HH of 2 dog     Wife recent dx and rx for breast cancer     Outpatient Medications Prior to Visit  Medication Sig Dispense Refill  . Naproxen Sodium (ALEVE PO) Take by mouth. As needed    . sertraline (ZOLOFT) 100 MG tablet Take 100 mg by mouth daily.    . simvastatin (ZOCOR) 40 MG tablet take 1 tablet by mouth once daily 90 tablet 0  . gabapentin (NEURONTIN) 100 MG capsule Take 1 capsule (100 mg total) by mouth at bedtime. (Patient not taking: Reported on 05/06/2016) 30 capsule 0   No facility-administered  medications prior to visit.      EXAM:  BP 120/80 (BP Location: Right Arm, Patient Position: Sitting, Cuff Size: Normal)   Temp 99.1 F (37.3 C) (Oral)   Wt 161 lb 12.8 oz (73.4 kg)   BMI 24.24 kg/m   Body mass index is 24.24 kg/m.  GENERAL: vitals reviewed and listed above, alert, oriented, appears well hydrated and in no acute distress  HEENT: atraumatic, conjunctiva  clear, no obvious abnormalities on inspection of external nose and ears NECK: no obvious masses on inspection palpation  MS: moves all extremities  Gait favors the left foot ...cannot dorsi flex past 90 toes nor ankle. Pulses are present. Vibration sense /position sense not tested today. No full numbness but tingling just on the plantar  heel. His muscle mass seems symmetrical negative back pain. DTRs absent left Achilles no clonus, toes ?  downgoing ,patellar reflexes +3 and brisk. PSYCH: pleasant and cooperative, no  obvious depression or anxiety Lab Results  Component Value Date   WBC 5.3 05/06/2016   HGB 14.6 05/06/2016   HCT 42.8 05/06/2016   PLT 270.0 05/06/2016   GLUCOSE 93 05/06/2016   CHOL 157 05/06/2016   TRIG 56.0 05/06/2016   HDL 59.30 05/06/2016   LDLDIRECT 147.2 11/29/2009   LDLCALC 87 05/06/2016   ALT 15 05/06/2016   AST 22 05/06/2016   NA 137 05/06/2016   K 4.6 05/06/2016   CL 102 05/06/2016   CREATININE 0.78 05/06/2016   BUN 23 05/06/2016   CO2 27 05/06/2016   TSH 1.28 05/06/2016   PSA 1.24 05/06/2016   HGBA1C 5.8 12/30/2007    ASSESSMENT AND PLAN:  Discussed the following assessment and plan:  Foot drop, left - Plan: MR Lumbar Spine Wo Contrast, MR Brain W Wo Contrast  Neuropathy (HCC) - Plan: MR Lumbar Spine Wo Contrast, MR Brain W Wo Contrast Lab in the system reviewed B12 is normal but on the low normal side. Rest of labs unrevealing. He has lost weight felt secondary to depression under treatment management. But denies lots of leg crossing that could be causing a peroneal  neuropathy and foot drop. This situation did cause him to fall which sounds like a mechanical fall/trip and not other event or weakness  This is new onset within the last week. We'll go ahead and put the order in for MRI brain and spine and arrange to make the neurology appointment more urgent. He is very concerned and anxious  May take clonopin before mri if needed .  Ok to not take the gabapentin  -Patient advised to return or notify health care team  if symptoms worsen ,persist or new concerns arise.  Patient Instructions  Blood tests look ok    Will get order in for  Mri    Back  Brain      Exam is good otherwise . Except for the  Foot drop. Will work on getting neurology to see  You this week .  Contact us if any other   New sx recur.   Your   b12 is low normal   Not certainly the cause but  Please supplement with B12 oral 1000 mg in the interim per day.   Neta MendsWanda K. Aleister Lady [id number]

## 2016-05-05 ENCOUNTER — Encounter: Payer: Self-pay | Admitting: Family

## 2016-05-06 ENCOUNTER — Telehealth: Payer: Self-pay | Admitting: Internal Medicine

## 2016-05-06 ENCOUNTER — Other Ambulatory Visit (INDEPENDENT_AMBULATORY_CARE_PROVIDER_SITE_OTHER): Payer: Federal, State, Local not specified - PPO

## 2016-05-06 ENCOUNTER — Encounter: Payer: Self-pay | Admitting: Internal Medicine

## 2016-05-06 ENCOUNTER — Ambulatory Visit (INDEPENDENT_AMBULATORY_CARE_PROVIDER_SITE_OTHER): Payer: Federal, State, Local not specified - PPO | Admitting: Internal Medicine

## 2016-05-06 VITALS — BP 120/80 | Temp 99.1°F | Wt 161.8 lb

## 2016-05-06 DIAGNOSIS — Z0000 Encounter for general adult medical examination without abnormal findings: Secondary | ICD-10-CM

## 2016-05-06 DIAGNOSIS — G629 Polyneuropathy, unspecified: Secondary | ICD-10-CM | POA: Diagnosis not present

## 2016-05-06 DIAGNOSIS — M21372 Foot drop, left foot: Secondary | ICD-10-CM

## 2016-05-06 LAB — CBC WITH DIFFERENTIAL/PLATELET
BASOS PCT: 0.8 % (ref 0.0–3.0)
Basophils Absolute: 0 10*3/uL (ref 0.0–0.1)
EOS ABS: 0.2 10*3/uL (ref 0.0–0.7)
Eosinophils Relative: 3.8 % (ref 0.0–5.0)
HEMATOCRIT: 42.8 % (ref 39.0–52.0)
HEMOGLOBIN: 14.6 g/dL (ref 13.0–17.0)
LYMPHS PCT: 37.1 % (ref 12.0–46.0)
Lymphs Abs: 2 10*3/uL (ref 0.7–4.0)
MCHC: 34.2 g/dL (ref 30.0–36.0)
MCV: 89.1 fl (ref 78.0–100.0)
Monocytes Absolute: 0.4 10*3/uL (ref 0.1–1.0)
Monocytes Relative: 7.8 % (ref 3.0–12.0)
Neutro Abs: 2.7 10*3/uL (ref 1.4–7.7)
Neutrophils Relative %: 50.5 % (ref 43.0–77.0)
Platelets: 270 10*3/uL (ref 150.0–400.0)
RBC: 4.81 Mil/uL (ref 4.22–5.81)
RDW: 13.7 % (ref 11.5–15.5)
WBC: 5.3 10*3/uL (ref 4.0–10.5)

## 2016-05-06 LAB — TSH: TSH: 1.28 u[IU]/mL (ref 0.35–4.50)

## 2016-05-06 LAB — HEPATIC FUNCTION PANEL
ALBUMIN: 4.2 g/dL (ref 3.5–5.2)
ALK PHOS: 61 U/L (ref 39–117)
ALT: 15 U/L (ref 0–53)
AST: 22 U/L (ref 0–37)
BILIRUBIN DIRECT: 0.2 mg/dL (ref 0.0–0.3)
Total Bilirubin: 0.8 mg/dL (ref 0.2–1.2)
Total Protein: 7.1 g/dL (ref 6.0–8.3)

## 2016-05-06 LAB — BASIC METABOLIC PANEL
BUN: 23 mg/dL (ref 6–23)
CHLORIDE: 102 meq/L (ref 96–112)
CO2: 27 mEq/L (ref 19–32)
CREATININE: 0.78 mg/dL (ref 0.40–1.50)
Calcium: 9 mg/dL (ref 8.4–10.5)
GFR: 105.6 mL/min (ref >60.00)
Glucose, Bld: 93 mg/dL (ref 70–99)
POTASSIUM: 4.6 meq/L (ref 3.5–5.1)
Sodium: 137 mEq/L (ref 135–145)

## 2016-05-06 LAB — LIPID PANEL
CHOLESTEROL: 157 mg/dL (ref 0–200)
HDL: 59.3 mg/dL (ref >39.00)
LDL CALC: 87 mg/dL (ref 0–99)
NonHDL: 97.85
Total CHOL/HDL Ratio: 3
Triglycerides: 56 mg/dL (ref 0.0–149.0)
VLDL: 11.2 mg/dL (ref 0.0–40.0)

## 2016-05-06 LAB — PSA: PSA: 1.24 ng/mL (ref 0.10–4.00)

## 2016-05-06 NOTE — Patient Instructions (Addendum)
Blood tests look ok    Will get order in for  Mri    Back  Brain      Exam is good otherwise . Except for the  Foot drop. Will work on getting neurology to see  You this week .  Contact us if any other   New sx recur.   Your   b12 is low normal   Not certainly the cause but  Please supplement with B12 oral 1000 mg in the interim per day.

## 2016-05-06 NOTE — Telephone Encounter (Addendum)
Pt would like to see if you could expedite the referral for the Neurologist (Dr. Patel).  HeAllena Katz has one that NP Jeanine LuzGregory Calone had put in but they are not able to get him in until July 10, 2016 and he is very nervous about the situation that he is experiencing and would like to see it you could get him in sometime in August or September.

## 2016-05-06 NOTE — Telephone Encounter (Signed)
Dr. Eliane DecreePatel's office will need to speak to Dr. Fabian SharpPanosh to expedite pt referral.  [place] Chardonika K Patel, DO Consulting Physician Neurology 05/06/2016 End  05/06/16  Phone: [phone];

## 2016-05-07 ENCOUNTER — Other Ambulatory Visit: Payer: Federal, State, Local not specified - PPO

## 2016-05-07 ENCOUNTER — Telehealth: Payer: Self-pay | Admitting: Family Medicine

## 2016-05-07 ENCOUNTER — Ambulatory Visit
Admission: RE | Admit: 2016-05-07 | Discharge: 2016-05-07 | Disposition: A | Discharge status: Home / Self Care | Payer: Federal, State, Local not specified - PPO | Source: Ambulatory Visit | Attending: Internal Medicine | Admitting: Internal Medicine

## 2016-05-07 ENCOUNTER — Ambulatory Visit: Payer: Federal, State, Local not specified - PPO | Admitting: Family Medicine

## 2016-05-07 DIAGNOSIS — M21372 Foot drop, left foot: Secondary | ICD-10-CM

## 2016-05-07 DIAGNOSIS — G629 Polyneuropathy, unspecified: Secondary | ICD-10-CM

## 2016-05-07 DIAGNOSIS — M5126 Other intervertebral disc displacement, lumbar region: Secondary | ICD-10-CM | POA: Diagnosis not present

## 2016-05-07 MED ORDER — GADOBENATE DIMEGLUMINE 529 MG/ML IV SOLN
15.0000 mL | Freq: Once | INTRAVENOUS | Status: AC | PRN
  Administered 2016-05-07: 15 mL via INTRAVENOUS

## 2016-05-07 NOTE — Telephone Encounter (Signed)
Pt will see Dr. Allena KatzPatel on 05/16/16

## 2016-05-07 NOTE — Telephone Encounter (Signed)
Left a message on home/cell for a return call. 

## 2016-05-07 NOTE — Telephone Encounter (Signed)
-----   Message from Madelin HeadingsWanda K Panosh, MD sent at 05/07/2016 10:33 AM EDT ----- Regarding: RE: Can You or colleague see him in the next week  1-2 weeks ? Thanks you   Misty can you  Inform patient  Of plan   Thanks WP ----- Message ----- From: [place] Chardonika K Patel, DO Sent: 05/07/2016  10:18 AM To: Madelin HeadingsWanda K Panosh, MD Subject: RE: Can You or colleague see him in the next#  Yes, we are trying to get him in next week on Thursday - my staff will reach out to him to schedule.    Thanks for the referral,  Donika ----- Message ----- From: Madelin HeadingsWanda K Panosh, MD Sent: 05/07/2016   7:45 AM To: [place] Chardonika K Patel, DO Subject: Can You or colleague see him in the next wee#  DP This gentlemen was referred to  Neuro and on your schedule  from Sharman CheekG Calone but I am PCP . Saw him in office yesterday with new onset  ( about a week)   For foot drop and 2 episodes of falling  ( this in a person who has been in good physical condition)  .  He is very anxious about why this is happening . And not obvious peroneal compression  And no back pain. But has had tingling in heel for a while.  Can we get  You or colleague to see him  Sooner ? .  (appt given was  Ocotober 25th....) Thanks  [facility]

## 2016-05-07 NOTE — Telephone Encounter (Signed)
Pt will see Dr. Allena KatzPatel on 05/16/16.  Pt is aware

## 2016-05-07 NOTE — Telephone Encounter (Signed)
See him next week

## 2016-05-13 DIAGNOSIS — F332 Major depressive disorder, recurrent severe without psychotic features: Secondary | ICD-10-CM | POA: Diagnosis not present

## 2016-05-14 ENCOUNTER — Encounter: Payer: Federal, State, Local not specified - PPO | Admitting: Internal Medicine

## 2016-05-16 ENCOUNTER — Encounter: Payer: Self-pay | Admitting: Neurology

## 2016-05-16 ENCOUNTER — Ambulatory Visit (INDEPENDENT_AMBULATORY_CARE_PROVIDER_SITE_OTHER): Payer: Federal, State, Local not specified - PPO | Admitting: Neurology

## 2016-05-16 VITALS — BP 138/84 | HR 61 | Ht 68.5 in | Wt 166.3 lb

## 2016-05-16 DIAGNOSIS — M21372 Foot drop, left foot: Secondary | ICD-10-CM

## 2016-05-16 DIAGNOSIS — M4807 Spinal stenosis, lumbosacral region: Secondary | ICD-10-CM

## 2016-05-16 DIAGNOSIS — M4806 Spinal stenosis, lumbar region: Secondary | ICD-10-CM

## 2016-05-16 DIAGNOSIS — M5417 Radiculopathy, lumbosacral region: Secondary | ICD-10-CM

## 2016-05-16 DIAGNOSIS — G8314 Monoplegia of lower limb affecting left nondominant side: Secondary | ICD-10-CM

## 2016-05-16 NOTE — Patient Instructions (Signed)
1.  NCS/EMG of the legs 2.  We will send referral for left ankle foot orthotics  Further recommendations will be based on the results of your testing

## 2016-05-16 NOTE — Progress Notes (Signed)
[facility] Clinic Note - Initial Visit   Date: 05/16/16  Randall Torres MRN: [id number] DOB: 09-17-1950   Dear Dr. Fabian Sharp:  Thank you for your kind referral of Randall Torres for consultation of left foot drop. Although his history is well known to you, please allow [place] to reiterate it for the purpose of our medical record. The patient was accompanied to the clinic by wife who also provides collateral information.     History of Present Illness: Randall Torres is a 65 y.o. right-handed male with depression and hyperlipidemia, and ADD presenting for evaluation of left foot drop.  He is a Audiological scientist.      He reports having numbness and tingling of the left dorsum and sole of the foot since the summer of 2016.  He also has mild tingling over the right heel, but he did not make much of the tingling as it did not bother him.  However, in mid-August, he was at church and when he stood up, he noticed weakness of the left foot such that he has to make a conscious effort to raise the left foot when walking.  He has suffered three falls and frequently stumbles.   He does not have any low back pain.  Symptoms have steadily become worse, because he has noticed that walking long distances takes more effort, especially uphill.    He has no history of diabetes or neuropathy.  He stopped drinking alcohol in 2010, and was previously drinking 3-4 beers/night for about two years.    Out-side paper records, electronic medical record, and images have been reviewed where available and summarized as:  MRI brain wwo contrast 05/07/2016:  No acute intracranial abnormality. Normal for age MRI appearance of the brain.  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 Anterolisthesis and mild disc bulging cause marked bilateral foraminal narrowing and compression of both exiting L5 roots. Small  foraminal and extra foraminal protrusion on the left at L2-3 contacts the left L2 root without compression or displacement.  Lab Results  Component Value Date   TSH 1.28 05/06/2016   Lab Results  Component Value Date   VITAMINB12 234 05/03/2016   Lab Results  Component Value Date   FOLATE 11.4 05/03/2016    Past Medical History:  Diagnosis Date  . ADD (attention deficit disorder)   . ADJUSTMENT DISORDER 09/29/2007   Qualifier: Diagnosis of  By: Fabian Sharp MD, Neta Mends   . BPH (benign prostatic hyperplasia)    has seen urologist  . Hyperlipidemia   . Inguinal hernia    bilateral  . Personal history of colonic adenoma 06/30/2008  . Rhinitis 12/31/2010    Past Surgical History:  Procedure Laterality Date  . CYSTOSCOPY    . INGUINAL HERNIA REPAIR Bilateral 10/18/2014   Procedure: LAPAROSCOPIC BILATERAL INGUINAL HERNIA REPAIR  ;  Surgeon: Claud Kelp, MD;  Location: [facility];  Service: General;  Laterality: Bilateral;  . INSERTION OF MESH Bilateral 10/18/2014   Procedure: INSERTION OF MESH;  Surgeon: Claud Kelp, MD;  Location: [facility];  Service: General;  Laterality: Bilateral;  . KNEE ARTHROSCOPY    . TOTAL KNEE ARTHROPLASTY  11/08   medial compartmental      Medications:  Outpatient Encounter Prescriptions as of 05/16/2016  Medication Sig  . Naproxen Sodium (ALEVE PO) Take by mouth. As needed  . sertraline (ZOLOFT) 100 MG tablet Take 100 mg  by mouth daily.  . simvastatin (ZOCOR) 40 MG tablet take 1 tablet by mouth once daily  . gabapentin (NEURONTIN) 100 MG capsule Take 1 capsule (100 mg total) by mouth at bedtime. (Patient not taking: Reported on 05/06/2016)   No facility-administered encounter medications on file as of 05/16/2016.      Allergies: No Known Allergies  Family History: Family History  Problem Relation Age of Onset  . Diabetes    . Other      CVA age 68  . Other      sleep problems  . Myasthenia gravis Brother 32    deceased  . Hyperlipidemia    .  Alcohol abuse      Uncle  . Other Mother   . Stroke Father   . Diabetes Father   . Throat cancer Sister   . Breast cancer Sister   . Healthy Son   . Neuropathy Neg Hx     Social History: Social History  Substance Use Topics  . Smoking status: Never Smoker  . Smokeless tobacco: Never Used  . Alcohol use No     Comment: Hx of alcohol abuse - none since 2010   Social History   Social History Narrative   Occupation: Publishing rights manager.   Highest level of education: law school   Married   Exercise: Swims daily  5-6 days per week.    Goes to AA  5 day per week or so.      HH of 2 dog     Wife recent dx and rx for breast cancer    2 children.   Lives with wife in a 2 story home.     Review of Systems:  CONSTITUTIONAL: No fevers, chills, night sweats, or weight loss.   EYES: No visual changes or eye pain ENT: No hearing changes.  No history of nose bleeds.   RESPIRATORY: No cough, wheezing and shortness of breath.   CARDIOVASCULAR: Negative for chest pain, and palpitations.   GI: Negative for abdominal discomfort, blood in stools or black stools.  No recent change in bowel habits.   GU:  No history of incontinence.   MUSCLOSKELETAL: No history of joint pain or swelling.  No myalgias.   SKIN: Negative for lesions, rash, and itching.   HEMATOLOGY/ONCOLOGY: Negative for prolonged bleeding, bruising easily, and swollen nodes.  No history of cancer.   ENDOCRINE: Negative for cold or heat intolerance, polydipsia or goiter.   PSYCH:  +depression or anxiety symptoms.   NEURO: As Above.   Vital Signs:  BP 138/84   Pulse 61   Ht 5' 8.5" (1.74 m)   Wt 166 lb 5 oz (75.4 kg)   SpO2 97%   BMI 24.92 kg/m  Pain Scale: 0 on a scale of 0-10   General Medical Exam:   General:  Well appearing, comfortable.   Eyes/ENT: see cranial nerve examination.   Neck: No masses appreciated.  Full range of motion without tenderness.  No carotid bruits. Respiratory:  Clear to auscultation, good  air entry bilaterally.   Cardiac:  Regular rate and rhythm, no murmur.   Extremities:  No deformities, edema, or skin discoloration.  Skin:  No rashes or lesions.  Neurological Exam: MENTAL STATUS including orientation to time, place, person, recent and remote memory, attention span and concentration, language, and fund of knowledge is normal.  Speech is not dysarthric.  CRANIAL NERVES: II:  No visual field defects.  Unremarkable fundi.   III-IV-VI: Pupils equal  round and reactive to light.  Normal conjugate, extra-ocular eye movements in all directions of gaze.  No nystagmus.  No ptosis.   V:  Normal facial sensation.     VII:  Normal facial symmetry and movements.  No pathologic facial reflexes.  VIII:  Normal hearing and vestibular function.   IX-X:  Normal palatal movement.   XI:  Normal shoulder shrug and head rotation.   XII:  Normal tongue strength and range of motion, no deviation or fasciculation.  MOTOR:  No atrophy, fasciculations or abnormal movements.  No pronator drift.  Tone is normal.    Right Upper Extremity:    Left Upper Extremity:    Deltoid  5/5   Deltoid  5/5   Biceps  5/5   Biceps  5/5   Triceps  5/5   Triceps  5/5   Wrist extensors  5/5   Wrist extensors  5/5   Wrist flexors  5/5   Wrist flexors  5/5   Finger extensors  5/5   Finger extensors  5/5   Finger flexors  5/5   Finger flexors  5/5   Dorsal interossei  5/5   Dorsal interossei  5/5   Abductor pollicis  5/5   Abductor pollicis  5/5   Tone (Ashworth scale)  0  Tone (Ashworth scale)  0   Right Lower Extremity:    Left Lower Extremity:    Hip flexors  5/5   Hip flexors  5/5   Hip extensors  5/5   Hip extensors  5/5   Adductor 5/5  Adductor 5/5  Abductor 5/5  Abductor 5/5  Knee flexors  5/5   Knee flexors  5/5   Knee extensors  5/5   Knee extensors  5/5   Dorsiflexors  4+/5   Dorsiflexors  0/5   Plantarflexors  5/5   Plantarflexors  5/5   Eversion 4/5  Eversion 2/5  Inversion 5/5  Inversion 4/5    Toe extensors  4/5   Toe extensors  0/5   Toe flexors  5/5   Toe flexors  5/5   Tone (Ashworth scale)  0  Tone (Ashworth scale)  0   MSRs:  Right                                                                 Left brachioradialis 2+  brachioradialis 2+  biceps 2+  biceps 2+  triceps 2+  triceps 2+  patellar 3+  patellar 3+  ankle jerk 2+  ankle jerk 2+  Hoffman no  Hoffman no  plantar response down  plantar response down   SENSORY: Vibration is reduced at the great toe bilaterally and there is diminished temperature over the dorsum of the feet.    COORDINATION/GAIT: Normal finger-to- nose-finger and heel-to-shin.  Intact rapid alternating movements bilaterally.  Able to rise from a chair without using arms.  Gait shows high steppage on the left.  He is unsteady with tandem gait.  He is easily able to walk on toes, but unable to even stand on heels, worse on the left.   IMPRESSION/PLAN: Mr. Randall Torres is a delightful 65 year-old gentleman referred for evaluation of left foot drop.  His exam shows severe weakness with left foot dorsiflexion, toe extension,  eversion, and to a lesser extent inversion.  These findings are also present in the right lower extremity, but to a much lesser degree.  Sensory loss is present over the L5 dermatome of the foot.  I have reviewed patient's MRI brain and lumbar spine with him and his wife that shows bilateral L5 pars defects with anterolisthesis of L5 on S1 as well as marked L5 nerve foraminal stenosis with neural impingement.  His exam and imaging is consistent with L5 radiculopathy bilaterally, clinically worse on the left.  Peroneal mononeuropathy is unlikely.  To better assess severity and prognosis, NCS/EMG will be performed, but he will certainly need surgical evaluation to decompress the L5 nerve root.  Prognosis was also discussed as he is very active and is understandably concerned about the severity of his foot drop.  I suspect that even after surgery, he  will need a left AFO so will go ahead and send him for orthotics evaluation.    The duration of this appointment visit was 60 minutes of face-to-face time with the patient.  Greater than 50% of this time was spent in counseling, explanation of diagnosis, planning of further management, and coordination of care.   Thank you for allowing me to participate in patient's care.  If I can answer any additional questions, I would be pleased to do so.    Sincerely,    Ginette Bradway K. Allena Katz, DO

## 2016-05-17 ENCOUNTER — Other Ambulatory Visit: Payer: Self-pay | Admitting: *Deleted

## 2016-05-17 MED ORDER — AMBULATORY NON FORMULARY MEDICATION: 1.0000 [IU] | Freq: Every day | 0 refills | Status: DC

## 2016-05-17 NOTE — Progress Notes (Signed)
Referral sent 

## 2016-05-21 ENCOUNTER — Ambulatory Visit (INDEPENDENT_AMBULATORY_CARE_PROVIDER_SITE_OTHER): Payer: Federal, State, Local not specified - PPO | Admitting: Neurology

## 2016-05-21 DIAGNOSIS — G5732 Lesion of lateral popliteal nerve, left lower limb: Secondary | ICD-10-CM

## 2016-05-21 DIAGNOSIS — M21372 Foot drop, left foot: Secondary | ICD-10-CM | POA: Diagnosis not present

## 2016-05-21 DIAGNOSIS — M4807 Spinal stenosis, lumbosacral region: Secondary | ICD-10-CM

## 2016-05-21 DIAGNOSIS — F332 Major depressive disorder, recurrent severe without psychotic features: Secondary | ICD-10-CM | POA: Diagnosis not present

## 2016-05-21 NOTE — Procedures (Signed)
[facility]  [address], [address]  [place], [place] [address] Tel: [phone] Fax:  [phone] Test Date:  05/21/2016  Patient: Randall RastMichael Dabney DOB: 1951/03/17 Physician: Nita Sickleonika Haru Anspaugh, DO  Sex: Male Height: 5\' 8"  Ref Phys: Nita Sickleonika Arnette Driggs, DO  ID#: [id number] Temp: 34.2C Technician: Judie PetitM. Dean   Patient Complaints: This is a 65 year old gentleman referred for evaluation of left foot drop.  NCV & EMG Findings: Extensive electrodiagnostic testing of the left lower extremity and additional studies of the right shows: 1. Bilateral sural and superficial peroneal sensory responses are within normal limits. 2. Left peroneal motor response shows conduction block and conduction velocity slowing (Poplt-B Fib, 29 m/s) at the fibular head. Bilateral tibial and right peroneal motor responses are within normal limits. 3. Bilateral tibial H reflex studies are within normal limits. 4. No motor unit recruitment is seen in the left tibialis anterior, fibularis longus, and extensor hallucis longus muscles, despite maximal activation. Sparse chronic motor axon loss changes are seen affecting bilateral gluteus medius, flexor digitorum longus, as well as the the right fibularis longus and tibialis anterior muscles.   Impression: 1. 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 2. There is also evidence of a L5 lumbosacral radiculopathy affecting bilateral lower extremities, mild in degree electrically.    ___________________________ Nita Sickleonika Earlin Sweeden, DO    Nerve Conduction Studies Anti Sensory Summary Table   Site NR Peak (ms) Norm Peak (ms) P-T Amp (V) Norm P-T Amp  Left Sup Peroneal Anti Sensory (Ant Lat Mall)  12 cm    3.2 <4.6 3.4 >3  Site 2    3.4  3.1   Right Sup Peroneal Anti Sensory (Ant Lat Mall)  12 cm    3.2 <4.6 5.0 >3  Left Sural Anti Sensory (Lat Mall)  Calf    3.3 <4.6 8.1 >3  Right Sural Anti Sensory (Lat  Mall)  Calf    3.0 <4.6 6.1 >3   Motor Summary Table   Site NR Onset (ms) Norm Onset (ms) O-P Amp (mV) Norm O-P Amp Site1 Site2 Delta-0 (ms) Dist (cm) Vel (m/s) Norm Vel (m/s)  Left Peroneal Motor (Ext Dig Brev)  Ankle    3.9 <6.0 3.7 >2.5 B Fib Ankle 7.4 33.0 45 >40  B Fib    11.3  3.4  Poplt B Fib 3.5 10.0 29 >40  Poplt    14.8  0.6         Right Peroneal Motor (Ext Dig Brev)  Ankle    3.8 <6.0 4.2 >2.5 B Fib Ankle 6.7 32.0 48 >40  B Fib    10.5  3.3  Poplt B Fib 2.2 10.0 45 >40  Poplt    12.7  2.8         Left Peroneal TA Motor (Tib Ant)  Fib Head    3.0 <4.5 4.5 >3 Poplit Fib Head 1.5 7.5 50 >40  Poplit    4.5  0.7         Right Peroneal TA Motor (Tib Ant)  Fib Head    3.1 <4.5 5.3 >3 Poplit Fib Head 2.1 10.0 48 >40  Poplit    5.2  4.0         Left Tibial Motor (Abd Hall Brev)  Ankle    3.8 <6.0 7.2 >4 Knee Ankle 9.2 40.0 43 >40  Knee    13.0  4.2         Right Tibial  Motor (Abd Hall Brev)  Ankle    3.4 <6.0 7.5 >4 Knee Ankle 9.4 40.0 43 >40  Knee    12.8  3.5          H Reflex Studies   NR H-Lat (ms) Lat Norm (ms) L-R H-Lat (ms) M-Lat (ms) HLat-MLat (ms)  Left Tibial (Gastroc)     33.06 <35 0.13 4.90 28.16  Right Tibial (Gastroc)     32.93 <35 0.13 4.90 28.03   EMG    Side Muscle Ins Act Fibs Psw Fasc Number Recrt Dur Dur. Amp Amp. Poly Poly. Comment  Left AntTibialis Nml Nml Nml Nml None None - - - - - - N/A  Left Fibularis Long Nml Nml Nml Nml None None - - - - - - N/A  Left ExtHallLong Nml Nml Nml Nml None None - - - - - - N/A  Left GluteusMed Nml Nml Nml Nml 1- Rapid Few 1+ Few 1+ Nml Nml N/A  Left RectFemoris Nml Nml Nml Nml Nml Nml Nml Nml Nml Nml Nml Nml N/A  Left Gastroc Nml Nml Nml Nml Nml Nml Nml Nml Nml Nml Nml Nml N/A  Left Flex Dig Long Nml Nml Nml Nml 1- Rapid Some 1+ Some 1+ Nml Nml N/A  Right Fibularis Long Nml Nml Nml Nml 1- Rapid Few 1+ Few 1+ Nml Nml N/A  Left BicepsFemS Nml Nml Nml Nml Nml Nml Nml Nml Nml Nml Nml Nml N/A  Right RectFemoris Nml  Nml Nml Nml Nml Nml Nml Nml Nml Nml Nml Nml N/A  Left Lumbo Parasp Low Nml Nml Nml Nml NE - - - - - - - N/A  Right Gastroc Nml Nml Nml Nml Nml Nml Nml Nml Nml Nml Nml Nml N/A  Right Flex Dig Long Nml Nml Nml Nml 1- Rapid Some 1+ Some 1+ Nml Nml N/A  Right AntTibialis Nml Nml Nml Nml 1- Rapid Some 1+ Some 1+ Nml Nml N/A  Right GluteusMed Nml Nml Nml Nml 1- Rapid Some 1+ Some 1+ Nml Nml N/A    Waveforms:

## 2016-05-22 ENCOUNTER — Other Ambulatory Visit: Payer: Self-pay | Admitting: *Deleted

## 2016-05-22 ENCOUNTER — Encounter: Payer: Self-pay | Admitting: Internal Medicine

## 2016-05-22 ENCOUNTER — Ambulatory Visit (INDEPENDENT_AMBULATORY_CARE_PROVIDER_SITE_OTHER): Payer: Federal, State, Local not specified - PPO | Admitting: Internal Medicine

## 2016-05-22 ENCOUNTER — Telehealth: Payer: Self-pay | Admitting: Neurology

## 2016-05-22 ENCOUNTER — Ambulatory Visit
Admission: RE | Admit: 2016-05-22 | Discharge: 2016-05-22 | Disposition: A | Discharge status: Home / Self Care | Payer: Federal, State, Local not specified - PPO | Source: Ambulatory Visit | Attending: Neurology | Admitting: Neurology

## 2016-05-22 VITALS — BP 136/80 | Temp 98.4°F | Ht 68.5 in | Wt 162.8 lb

## 2016-05-22 DIAGNOSIS — E785 Hyperlipidemia, unspecified: Secondary | ICD-10-CM

## 2016-05-22 DIAGNOSIS — Z0000 Encounter for general adult medical examination without abnormal findings: Secondary | ICD-10-CM

## 2016-05-22 DIAGNOSIS — M1712 Unilateral primary osteoarthritis, left knee: Secondary | ICD-10-CM

## 2016-05-22 DIAGNOSIS — M21372 Foot drop, left foot: Secondary | ICD-10-CM | POA: Diagnosis not present

## 2016-05-22 DIAGNOSIS — Z23 Encounter for immunization: Secondary | ICD-10-CM

## 2016-05-22 NOTE — Telephone Encounter (Signed)
Results of EMG discussed with patient which shows prominent left common peroneal mononeuropathy at the fibular head.  There is also evidence of bilateral L5 radiculopathy, but this is contributing to a lesser degree.  I will order MRI left knee with and without contrast to look for structural nerve compression.  Dolph Tavano K. Allena KatzPatel, DO

## 2016-05-22 NOTE — Patient Instructions (Addendum)
Continue lifestyle intervention healthy eating and exercise .  And fu with  neuro and your psychiatrist.   Plan   Wellness check and  Labs and exam in  1 year or as needed  Du for pneumovax   pneumonia vaccine  After  11 5 2017   Health Maintenance, Male A healthy lifestyle and preventative care can promote health and wellness.  Maintain regular health, dental, and eye exams.  Eat a healthy diet. Foods like vegetables, fruits, whole grains, low-fat dairy products, and lean protein foods contain the nutrients you need and are low in calories. Decrease your intake of foods high in solid fats, added sugars, and salt. Get information about a proper diet from your health care provider, if necessary.  Regular physical exercise is one of the most important things you can do for your health. Most adults should get at least 150 minutes of moderate-intensity exercise (any activity that increases your heart rate and causes you to sweat) each week. In addition, most adults need muscle-strengthening exercises on 2 or more days a week.   Maintain a healthy weight. The body mass index (BMI) is a screening tool to identify possible weight problems. It provides an estimate of body fat based on height and weight. Your health care provider can find your BMI and can help you achieve or maintain a healthy weight. For males 20 years and older:  A BMI below 18.5 is considered underweight.  A BMI of 18.5 to 24.9 is normal.  A BMI of 25 to 29.9 is considered overweight.  A BMI of 30 and above is considered obese.  Maintain normal blood lipids and cholesterol by exercising and minimizing your intake of saturated fat. Eat a balanced diet with plenty of fruits and vegetables. Blood tests for lipids and cholesterol should begin at age 50 and be repeated every 5 years. If your lipid or cholesterol levels are high, you are over age 26, or you are at high risk for heart disease, you may need your cholesterol levels  checked more frequently.Ongoing high lipid and cholesterol levels should be treated with medicines if diet and exercise are not working.  If you smoke, find out from your health care provider how to quit. If you do not use tobacco, do not start.  Lung cancer screening is recommended for adults aged 55-80 years who are at high risk for developing lung cancer because of a history of smoking. A yearly low-dose CT scan of the lungs is recommended for people who have at least a 30-pack-year history of smoking and are current smokers or have quit within the past 15 years. A pack year of smoking is smoking an average of 1 pack of cigarettes a day for 1 year (for example, a 30-pack-year history of smoking could mean smoking 1 pack a day for 30 years or 2 packs a day for 15 years). Yearly screening should continue until the smoker has stopped smoking for at least 15 years. Yearly screening should be stopped for people who develop a health problem that would prevent them from having lung cancer treatment.  If you choose to drink alcohol, do not have more than 2 drinks per day. One drink is considered to be 12 oz (360 mL) of beer, 5 oz (150 mL) of wine, or 1.5 oz (45 mL) of liquor.  Avoid the use of street drugs. Do not share needles with anyone. Ask for help if you need support or instructions about stopping the use of drugs.  High blood pressure causes heart disease and increases the risk of stroke. High blood pressure is more likely to develop in:  People who have blood pressure in the end of the normal range (100-139/85-89 mm Hg).  People who are overweight or obese.  People who are African American.  If you are 42-49 years of age, have your blood pressure checked every 3-5 years. If you are 38 years of age or older, have your blood pressure checked every year. You should have your blood pressure measured twice--once when you are at a hospital or clinic, and once when you are not at a hospital or clinic.  Record the average of the two measurements. To check your blood pressure when you are not at a hospital or clinic, you can use:  An automated blood pressure machine at a pharmacy.  A home blood pressure monitor.  If you are 18-62 years old, ask your health care provider if you should take aspirin to prevent heart disease.  Diabetes screening involves taking a blood sample to check your fasting blood sugar level. This should be done once every 3 years after age 42 if you are at a normal weight and without risk factors for diabetes. Testing should be considered at a younger age or be carried out more frequently if you are overweight and have at least 1 risk factor for diabetes.  Colorectal cancer can be detected and often prevented. Most routine colorectal cancer screening begins at the age of 58 and continues through age 38. However, your health care provider may recommend screening at an earlier age if you have risk factors for colon cancer. On a yearly basis, your health care provider may provide home test kits to check for hidden blood in the stool. A small camera at the end of a tube may be used to directly examine the colon (sigmoidoscopy or colonoscopy) to detect the earliest forms of colorectal cancer. Talk to your health care provider about this at age 44 when routine screening begins. A direct exam of the colon should be repeated every 5-10 years through age 56, unless early forms of precancerous polyps or small growths are found.  People who are at an increased risk for hepatitis B should be screened for this virus. You are considered at high risk for hepatitis B if:  You were born in a country where hepatitis B occurs often. Talk with your health care provider about which countries are considered high risk.  Your parents were born in a high-risk country and you have not received a shot to protect against hepatitis B (hepatitis B vaccine).  You have HIV or AIDS.  You use needles to  inject street drugs.  You live with, or have sex with, someone who has hepatitis B.  You are a man who has sex with other men (MSM).  You get hemodialysis treatment.  You take certain medicines for conditions like cancer, organ transplantation, and autoimmune conditions.  Hepatitis C blood testing is recommended for all people born from [birth year] through 1967 and any individual with known risk factors for hepatitis C.  Healthy men should no longer receive prostate-specific antigen (PSA) blood tests as part of routine cancer screening. Talk to your health care provider about prostate cancer screening.  Testicular cancer screening is not recommended for adolescents or adult males who have no symptoms. Screening includes self-exam, a health care provider exam, and other screening tests. Consult with your health care provider about any symptoms you have or any  concerns you have about testicular cancer.  Practice safe sex. Use condoms and avoid high-risk sexual practices to reduce the spread of sexually transmitted infections (STIs).  You should be screened for STIs, including gonorrhea and chlamydia if:  You are sexually active and are younger than 24 years.  You are older than 24 years, and your health care provider tells you that you are at risk for this type of infection.  Your sexual activity has changed since you were last screened, and you are at an increased risk for chlamydia or gonorrhea. Ask your health care provider if you are at risk.  If you are at risk of being infected with HIV, it is recommended that you take a prescription medicine daily to prevent HIV infection. This is called pre-exposure prophylaxis (PrEP). You are considered at risk if:  You are a man who has sex with other men (MSM).  You are a heterosexual man who is sexually active with multiple partners.  You take drugs by injection.  You are sexually active with a partner who has HIV.  Talk with your health care  provider about whether you are at high risk of being infected with HIV. If you choose to begin PrEP, you should first be tested for HIV. You should then be tested every 3 months for as long as you are taking PrEP.  Use sunscreen. Apply sunscreen liberally and repeatedly throughout the day. You should seek shade when your shadow is shorter than you. Protect yourself by wearing long sleeves, pants, a wide-brimmed hat, and sunglasses year round whenever you are outdoors.  Tell your health care provider of new moles or changes in moles, especially if there is a change in shape or color. Also, tell your health care provider if a mole is larger than the size of a pencil eraser.  A one-time screening for abdominal aortic aneurysm (AAA) and surgical repair of large AAAs by ultrasound is recommended for men aged 65-75 years who are current or former smokers.  Stay current with your vaccines (immunizations).   This information is not intended to replace advice given to you by your health care provider. Make sure you discuss any questions you have with your health care provider.   Document Released: 02/29/2008 Document Revised: 09/23/2014 Document Reviewed: 01/28/2011 [employer] Interactive Patient Education [employer]2016 [employer]

## 2016-05-22 NOTE — Progress Notes (Signed)
Pre visit review using our clinic review tool, if applicable. No additional management support is needed unless otherwise documented below in the visit note.  Chief Complaint  Patient presents with  . Annual Exam    HPI: Patient  Randall Torres  65 y.o. 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  Depression getting better sees psych tomorrow   Health Maintenance  Topic Date Due  . Hepatitis C Screening  05/06/2017 (Originally 04-Mar-1951)  . PNA vac Low Risk Adult (2 of 2 - PPSV23) 07/21/2016  . COLONOSCOPY  07/30/2023  . TETANUS/TDAP  12/05/2024  . INFLUENZA VACCINE  Completed  . ZOSTAVAX  Completed   Health Maintenance Review LIFESTYLE:  Exercise:  Yes but limited by foot drop recent dx  Swim and walks  Tobacco/ETS: no Alcohol: no in remission Sugar beverages: n Sleep: 7-8 Drug use: no hh of 2 may not retire this year after all     ROS:  GEN/ HEENT: No fever, significant weight changes sweats headaches vision problems hearing changes, CV/ PULM; No chest pain shortness of breath cough, syncope,edema  change in exercise tolerance. GI /GU: No adominal pain, vomiting, change in bowel habits. No blood in the stool. No significant GU symptoms. SKIN/HEME: ,no acute skin rashes suspicious lesions or bleeding. No lymphadenopathy, nodules, masses.  NEURO/ PSYCH:  No neurologic signs such as weakness numbness. No depression anxiety. IMM/ Allergy: No unusual infections.  Allergy .   REST of 12 system review negative except as per HPI   Past Medical History:  Diagnosis Date  . ADD (attention deficit disorder)   . ADJUSTMENT DISORDER 09/29/2007   Qualifier: Diagnosis of  By: Fabian Sharp MD, Neta Mends   . BPH (benign prostatic hyperplasia)    has seen urologist  . Hyperlipidemia   . Inguinal hernia    bilateral  . Personal history of colonic adenoma 06/30/2008  .  Rhinitis 12/31/2010    Past Surgical History:  Procedure Laterality Date  . CYSTOSCOPY    . INGUINAL HERNIA REPAIR Bilateral 10/18/2014   Procedure: LAPAROSCOPIC BILATERAL INGUINAL HERNIA REPAIR  ;  Surgeon: Claud Kelp, MD;  Location: [facility];  Service: General;  Laterality: Bilateral;  . INSERTION OF MESH Bilateral 10/18/2014   Procedure: INSERTION OF MESH;  Surgeon: Claud Kelp, MD;  Location: [facility];  Service: General;  Laterality: Bilateral;  . KNEE ARTHROSCOPY    . TOTAL KNEE ARTHROPLASTY  11/08   medial compartmental     Family History  Problem Relation Age of Onset  . Diabetes    . Other      CVA age 90  . Other      sleep problems  . Myasthenia gravis Brother 32    deceased  . Hyperlipidemia    . Alcohol abuse      Uncle  . Other Mother   . Stroke Father   . Diabetes Father   . Throat cancer Sister   . Breast cancer Sister   . Healthy Son   . Neuropathy Neg Hx     Social History   Social History  . Marital status: Married    Spouse name: N/A  . Number of children: N/A  . Years of education: N/A   Social History Main Topics  . Smoking status: Never Smoker  . Smokeless tobacco: Never Used  . Alcohol use No  Comment: Hx of alcohol abuse - none since 2010  . Drug use: No  . Sexual activity: Not Asked   Other Topics Concern  . None   Social History Narrative   Occupation: Publishing rights manager.   Highest level of education: law school   Married   Exercise: Swims daily  5-6 days per week.    Goes to AA  5 day per week or so.      HH of 2 dog     Wife recent dx and rx for breast cancer    2 children.   Lives with wife in a 2 story home.     Outpatient Medications Prior to Visit  Medication Sig Dispense Refill  . AMBULATORY NON FORMULARY MEDICATION 1 Units by Other route daily. Left ankle/foot orthotic. 1 Units 0  . Naproxen Sodium (ALEVE PO) Take by mouth. As needed    . sertraline (ZOLOFT) 100 MG tablet Take 100 mg by mouth daily.    .  simvastatin (ZOCOR) 40 MG tablet take 1 tablet by mouth once daily 90 tablet 0  . gabapentin (NEURONTIN) 100 MG capsule Take 1 capsule (100 mg total) by mouth at bedtime. (Patient not taking: Reported on 05/06/2016) 30 capsule 0   No facility-administered medications prior to visit.      EXAM:  BP 136/80 (BP Location: Right Arm, Patient Position: Sitting, Cuff Size: Normal)   Temp 98.4 F (36.9 C) (Oral)   Ht 5' 8.5" (1.74 m)   Wt 162 lb 12.8 oz (73.8 kg)   BMI 24.39 kg/m   Body mass index is 24.39 kg/m.  Physical Exam: Vital signs reviewed ZOX:WRUE is a well-developed well-nourished alert cooperative    who appearsr stated age in no acute distress.  HEENT: normocephalic atraumatic , Eyes: PERRL EOM's full, conjunctiva clear, Nares: paten,t no deformity discharge or tenderness., Ears: no deformity EAC's clr wax left clear  Hearing aid TMs with normal landmarks. Mouth: clear OP, no lesions, edema.  Moist mucous membranes. Dentition in adequate repair. NECK: supple without masses, thyromegaly or bruits. CHEST/PULM:  Clear to auscultation and percussion breath sounds equal no wheeze , rales or rhonchi. No chest wall deformities or tenderness. CV: PMI is nondisplaced, S1 S2 no gallops, murmurs, rubs. Peripheral pulses are full without delay.No JVD .  ABDOMEN: Bowel sounds normal nontender  No guard or rebound, no hepato splenomegal no CVA tenderness.  No hernia. Extremtities:  No clubbing cyanosis or edema, no acute joint swelling or redness no focal atrophy NEURO:  Oriented x3, cranial nerves 3-12 appear to be intact,  Foot drop as describe  Previously SKIN: No acute rashes normal turgor, color, no bruising or petechiae. PSYCH: Oriented, good eye contact, no obvious depression anxiety, cognition and judgment appear normal. LN: no cervical axillary inguinal adenopathy Rectal no masses prostate1- 2+ no nodules   Lab Results  Component Value Date   WBC 5.3 05/06/2016   HGB 14.6  05/06/2016   HCT 42.8 05/06/2016   PLT 270.0 05/06/2016   GLUCOSE 93 05/06/2016   CHOL 157 05/06/2016   TRIG 56.0 05/06/2016   HDL 59.30 05/06/2016   LDLDIRECT 147.2 11/29/2009   LDLCALC 87 05/06/2016   ALT 15 05/06/2016   AST 22 05/06/2016   NA 137 05/06/2016   K 4.6 05/06/2016   CL 102 05/06/2016   CREATININE 0.78 05/06/2016   BUN 23 05/06/2016   CO2 27 05/06/2016   TSH 1.28 05/06/2016   PSA 1.24 05/06/2016   HGBA1C 5.8  12/30/2007    ASSESSMENT AND PLAN:  Discussed the following assessment and plan:  Visit for preventive health examination  Hyperlipidemia - cont meds lsi  Need for prophylactic vaccination and inoculation against influenza - Plan: Flu vaccine HIGH DOSE PF (Fluzone High dose)  Foot drop, left - undereval   Patient Care Team: Madelin Headings, MD as PCP - General Su Grand, MD as Attending Physician (Urology) Clarene Duke (Psychiatry) Glendale Chard, DO as Consulting Physician (Neurology) Patient Instructions  Continue lifestyle intervention healthy eating and exercise .  And fu with  neuro and your psychiatrist.   Plan   Wellness check and  Labs and exam in  1 year or as needed  Du for pneumovax   pneumonia vaccine  After  11 5 2017   Health Maintenance, Male A healthy lifestyle and preventative care can promote health and wellness.  Maintain regular health, dental, and eye exams.  Eat a healthy diet. Foods like vegetables, fruits, whole grains, low-fat dairy products, and lean protein foods contain the nutrients you need and are low in calories. Decrease your intake of foods high in solid fats, added sugars, and salt. Get information about a proper diet from your health care provider, if necessary.  Regular physical exercise is one of the most important things you can do for your health. Most adults should get at least 150 minutes of moderate-intensity exercise (any activity that increases your heart rate and causes you to sweat) each week.  In addition, most adults need muscle-strengthening exercises on 2 or more days a week.   Maintain a healthy weight. The body mass index (BMI) is a screening tool to identify possible weight problems. It provides an estimate of body fat based on height and weight. Your health care provider can find your BMI and can help you achieve or maintain a healthy weight. For males 20 years and older:  A BMI below 18.5 is considered underweight.  A BMI of 18.5 to 24.9 is normal.  A BMI of 25 to 29.9 is considered overweight.  A BMI of 30 and above is considered obese.  Maintain normal blood lipids and cholesterol by exercising and minimizing your intake of saturated fat. Eat a balanced diet with plenty of fruits and vegetables. Blood tests for lipids and cholesterol should begin at age 31 and be repeated every 5 years. If your lipid or cholesterol levels are high, you are over age 22, or you are at high risk for heart disease, you may need your cholesterol levels checked more frequently.Ongoing high lipid and cholesterol levels should be treated with medicines if diet and exercise are not working.  If you smoke, find out from your health care provider how to quit. If you do not use tobacco, do not start.  Lung cancer screening is recommended for adults aged 55-80 years who are at high risk for developing lung cancer because of a history of smoking. A yearly low-dose CT scan of the lungs is recommended for people who have at least a 30-pack-year history of smoking and are current smokers or have quit within the past 15 years. A pack year of smoking is smoking an average of 1 pack of cigarettes a day for 1 year (for example, a 30-pack-year history of smoking could mean smoking 1 pack a day for 30 years or 2 packs a day for 15 years). Yearly screening should continue until the smoker has stopped smoking for at least 15 years. Yearly screening should be stopped  for people who develop a health problem that would  prevent them from having lung cancer treatment.  If you choose to drink alcohol, do not have more than 2 drinks per day. One drink is considered to be 12 oz (360 mL) of beer, 5 oz (150 mL) of wine, or 1.5 oz (45 mL) of liquor.  Avoid the use of street drugs. Do not share needles with anyone. Ask for help if you need support or instructions about stopping the use of drugs.  High blood pressure causes heart disease and increases the risk of stroke. High blood pressure is more likely to develop in:  People who have blood pressure in the end of the normal range (100-139/85-89 mm Hg).  People who are overweight or obese.  People who are African American.  If you are 62-22 years of age, have your blood pressure checked every 3-5 years. If you are 46 years of age or older, have your blood pressure checked every year. You should have your blood pressure measured twice--once when you are at a hospital or clinic, and once when you are not at a hospital or clinic. Record the average of the two measurements. To check your blood pressure when you are not at a hospital or clinic, you can use:  An automated blood pressure machine at a pharmacy.  A home blood pressure monitor.  If you are 82-47 years old, ask your health care provider if you should take aspirin to prevent heart disease.  Diabetes screening involves taking a blood sample to check your fasting blood sugar level. This should be done once every 3 years after age 67 if you are at a normal weight and without risk factors for diabetes. Testing should be considered at a younger age or be carried out more frequently if you are overweight and have at least 1 risk factor for diabetes.  Colorectal cancer can be detected and often prevented. Most routine colorectal cancer screening begins at the age of 17 and continues through age 80. However, your health care provider may recommend screening at an earlier age if you have risk factors for colon cancer.  On a yearly basis, your health care provider may provide home test kits to check for hidden blood in the stool. A small camera at the end of a tube may be used to directly examine the colon (sigmoidoscopy or colonoscopy) to detect the earliest forms of colorectal cancer. Talk to your health care provider about this at age 11 when routine screening begins. A direct exam of the colon should be repeated every 5-10 years through age 64, unless early forms of precancerous polyps or small growths are found.  People who are at an increased risk for hepatitis B should be screened for this virus. You are considered at high risk for hepatitis B if:  You were born in a country where hepatitis B occurs often. Talk with your health care provider about which countries are considered high risk.  Your parents were born in a high-risk country and you have not received a shot to protect against hepatitis B (hepatitis B vaccine).  You have HIV or AIDS.  You use needles to inject street drugs.  You live with, or have sex with, someone who has hepatitis B.  You are a man who has sex with other men (MSM).  You get hemodialysis treatment.  You take certain medicines for conditions like cancer, organ transplantation, and autoimmune conditions.  Hepatitis C blood testing is recommended for  all people born from [id number] through 1967 and any individual with known risk factors for hepatitis C.  Healthy men should no longer receive prostate-specific antigen (PSA) blood tests as part of routine cancer screening. Talk to your health care provider about prostate cancer screening.  Testicular cancer screening is not recommended for adolescents or adult males who have no symptoms. Screening includes self-exam, a health care provider exam, and other screening tests. Consult with your health care provider about any symptoms you have or any concerns you have about testicular cancer.  Practice safe sex. Use condoms and avoid  high-risk sexual practices to reduce the spread of sexually transmitted infections (STIs).  You should be screened for STIs, including gonorrhea and chlamydia if:  You are sexually active and are younger than 24 years.  You are older than 24 years, and your health care provider tells you that you are at risk for this type of infection.  Your sexual activity has changed since you were last screened, and you are at an increased risk for chlamydia or gonorrhea. Ask your health care provider if you are at risk.  If you are at risk of being infected with HIV, it is recommended that you take a prescription medicine daily to prevent HIV infection. This is called pre-exposure prophylaxis (PrEP). You are considered at risk if:  You are a man who has sex with other men (MSM).  You are a heterosexual man who is sexually active with multiple partners.  You take drugs by injection.  You are sexually active with a partner who has HIV.  Talk with your health care provider about whether you are at high risk of being infected with HIV. If you choose to begin PrEP, you should first be tested for HIV. You should then be tested every 3 months for as long as you are taking PrEP.  Use sunscreen. Apply sunscreen liberally and repeatedly throughout the day. You should seek shade when your shadow is shorter than you. Protect yourself by wearing long sleeves, pants, a wide-brimmed hat, and sunglasses year round whenever you are outdoors.  Tell your health care provider of new moles or changes in moles, especially if there is a change in shape or color. Also, tell your health care provider if a mole is larger than the size of a pencil eraser.  A one-time screening for abdominal aortic aneurysm (AAA) and surgical repair of large AAAs by ultrasound is recommended for men aged 65-75 years who are current or former smokers.  Stay current with your vaccines (immunizations).   This information is not intended to  replace advice given to you by your health care provider. Make sure you discuss any questions you have with your health care provider.   Document Released: 02/29/2008 Document Revised: 09/23/2014 Document Reviewed: 01/28/2011 Elsevier Interactive Patient Education [employer]2016 Elsevier Inc.      TregoWanda K. Panosh M.D.

## 2016-05-22 NOTE — Telephone Encounter (Signed)
Referral sent.  Patient has an appointment today at 12:00.

## 2016-05-23 ENCOUNTER — Telehealth: Payer: Self-pay | Admitting: Neurology

## 2016-05-23 ENCOUNTER — Other Ambulatory Visit: Payer: Self-pay | Admitting: *Deleted

## 2016-05-23 ENCOUNTER — Other Ambulatory Visit: Payer: Self-pay | Admitting: Neurology

## 2016-05-23 ENCOUNTER — Other Ambulatory Visit (INDEPENDENT_AMBULATORY_CARE_PROVIDER_SITE_OTHER): Payer: Federal, State, Local not specified - PPO

## 2016-05-23 DIAGNOSIS — E162 Hypoglycemia, unspecified: Secondary | ICD-10-CM | POA: Diagnosis not present

## 2016-05-23 DIAGNOSIS — G609 Hereditary and idiopathic neuropathy, unspecified: Secondary | ICD-10-CM

## 2016-05-23 DIAGNOSIS — F332 Major depressive disorder, recurrent severe without psychotic features: Secondary | ICD-10-CM | POA: Diagnosis not present

## 2016-05-23 LAB — SEDIMENTATION RATE: Sed Rate: 20 mm/hr (ref 0–20)

## 2016-05-23 LAB — HEMOGLOBIN A1C: Hgb A1c MFr Bld: 5.7 % (ref 4.6–6.5)

## 2016-05-23 LAB — VITAMIN B12: VITAMIN B 12: 254 pg/mL (ref 211–911)

## 2016-05-23 LAB — C-REACTIVE PROTEIN: CRP: 0.3 mg/dL — AB (ref 0.5–20.0)

## 2016-05-23 NOTE — Telephone Encounter (Signed)
Mr. Randall Torres returned my call.  I informed him that there is no evidence of a structural lesion affecting the peroneal nerve at the knee.    The next step will be to check labs for other causes of nerve injury - ESR, CRP, vitamin B12, MMA, vitamin B1,  ANA, SSA/B, copper, SPEP with IFE, HbA1c.    Randall Torres, please be sure that [facility] knows he is being referred for foot drop  - he mentioned that when he spoke with them on the phone, they did not know why he was being referred.    Randall K. Posey Pronto, DO

## 2016-05-23 NOTE — Telephone Encounter (Signed)
I attempted to contact patient via phone today regarding the results of MRI knee, however there was no answer so a message was left for the patient to return my call.   Rodneshia Greenhouse K. Allena KatzPatel, DO

## 2016-05-23 NOTE — Telephone Encounter (Signed)
Patient will be in for lab work this morning.

## 2016-05-24 LAB — ANA: Anti Nuclear Antibody(ANA): NEGATIVE

## 2016-05-24 LAB — SJOGREN'S SYNDROME ANTIBODS(SSA + SSB)
SSA (Ro) (ENA) Antibody, IgG: <1
SSB (LA) (ENA) ANTIBODY, IGG: NEGATIVE

## 2016-05-27 LAB — PROTEIN ELECTROPHORESIS, SERUM
ALBUMIN ELP: 3.9 g/dL (ref 3.8–4.8)
Alpha-1-Globulin: 0.3 g/dL (ref 0.2–0.3)
Alpha-2-Globulin: 0.6 g/dL (ref 0.5–0.9)
Beta 2: 0.4 g/dL (ref 0.2–0.5)
Beta Globulin: 0.5 g/dL (ref 0.4–0.6)
Gamma Globulin: 1.4 g/dL (ref 0.8–1.7)
TOTAL PROTEIN, SERUM ELECTROPHOR: 7 g/dL (ref 6.1–8.1)

## 2016-05-27 LAB — IMMUNOFIXATION ELECTROPHORESIS
IGA: 315 mg/dL (ref 81–463)
IGG (IMMUNOGLOBIN G), SERUM: 1419 mg/dL (ref 694–1618)
IGM, SERUM: 37 mg/dL — AB (ref 48–271)

## 2016-05-27 LAB — METHYLMALONIC ACID, SERUM: Methylmalonic Acid, Quant: 216 nmol/L (ref 87–318)

## 2016-05-27 LAB — COPPER, SERUM: Copper: 89 ug/dL (ref 72–166)

## 2016-05-28 ENCOUNTER — Encounter: Payer: Self-pay | Admitting: Neurology

## 2016-05-28 LAB — VITAMIN B1: Vitamin B1 (Thiamine): 15 nmol/L (ref 8–30)

## 2016-05-29 DIAGNOSIS — G589 Mononeuropathy, unspecified: Secondary | ICD-10-CM | POA: Diagnosis not present

## 2016-05-29 DIAGNOSIS — M5416 Radiculopathy, lumbar region: Secondary | ICD-10-CM | POA: Diagnosis not present

## 2016-05-30 ENCOUNTER — Ambulatory Visit (INDEPENDENT_AMBULATORY_CARE_PROVIDER_SITE_OTHER): Payer: Federal, State, Local not specified - PPO | Admitting: *Deleted

## 2016-05-30 DIAGNOSIS — E538 Deficiency of other specified B group vitamins: Secondary | ICD-10-CM

## 2016-05-30 MED ORDER — CYANOCOBALAMIN 1000 MCG/ML IJ SOLN
1000.0000 ug | Freq: Once | INTRAMUSCULAR | Status: AC
  Administered 2016-05-30: 1000 ug via INTRAMUSCULAR

## 2016-05-31 ENCOUNTER — Ambulatory Visit (INDEPENDENT_AMBULATORY_CARE_PROVIDER_SITE_OTHER): Payer: Federal, State, Local not specified - PPO | Admitting: *Deleted

## 2016-05-31 DIAGNOSIS — E538 Deficiency of other specified B group vitamins: Secondary | ICD-10-CM

## 2016-05-31 DIAGNOSIS — G589 Mononeuropathy, unspecified: Secondary | ICD-10-CM | POA: Diagnosis not present

## 2016-05-31 DIAGNOSIS — M5416 Radiculopathy, lumbar region: Secondary | ICD-10-CM | POA: Diagnosis not present

## 2016-05-31 MED ORDER — CYANOCOBALAMIN 1000 MCG/ML IJ SOLN
1000.0000 ug | Freq: Once | INTRAMUSCULAR | Status: AC
  Administered 2016-05-31: 1000 ug via INTRAMUSCULAR

## 2016-06-03 ENCOUNTER — Ambulatory Visit (INDEPENDENT_AMBULATORY_CARE_PROVIDER_SITE_OTHER): Payer: Federal, State, Local not specified - PPO | Admitting: *Deleted

## 2016-06-03 DIAGNOSIS — G589 Mononeuropathy, unspecified: Secondary | ICD-10-CM | POA: Diagnosis not present

## 2016-06-03 DIAGNOSIS — E538 Deficiency of other specified B group vitamins: Secondary | ICD-10-CM | POA: Diagnosis not present

## 2016-06-03 DIAGNOSIS — M5416 Radiculopathy, lumbar region: Secondary | ICD-10-CM | POA: Diagnosis not present

## 2016-06-03 MED ORDER — CYANOCOBALAMIN 1000 MCG/ML IJ SOLN
1000.0000 ug | Freq: Once | INTRAMUSCULAR | Status: AC
  Administered 2016-06-03: 1000 ug via INTRAMUSCULAR

## 2016-06-04 ENCOUNTER — Ambulatory Visit (INDEPENDENT_AMBULATORY_CARE_PROVIDER_SITE_OTHER): Payer: Federal, State, Local not specified - PPO | Admitting: *Deleted

## 2016-06-04 DIAGNOSIS — E538 Deficiency of other specified B group vitamins: Secondary | ICD-10-CM | POA: Diagnosis not present

## 2016-06-04 DIAGNOSIS — F332 Major depressive disorder, recurrent severe without psychotic features: Secondary | ICD-10-CM | POA: Diagnosis not present

## 2016-06-04 MED ORDER — CYANOCOBALAMIN 1000 MCG/ML IJ SOLN
1000.0000 ug | Freq: Once | INTRAMUSCULAR | Status: AC
  Administered 2016-06-04: 1000 ug via INTRAMUSCULAR

## 2016-06-05 ENCOUNTER — Ambulatory Visit (INDEPENDENT_AMBULATORY_CARE_PROVIDER_SITE_OTHER): Payer: Federal, State, Local not specified - PPO | Admitting: *Deleted

## 2016-06-05 DIAGNOSIS — E538 Deficiency of other specified B group vitamins: Secondary | ICD-10-CM | POA: Diagnosis not present

## 2016-06-05 DIAGNOSIS — M5416 Radiculopathy, lumbar region: Secondary | ICD-10-CM | POA: Diagnosis not present

## 2016-06-05 DIAGNOSIS — G589 Mononeuropathy, unspecified: Secondary | ICD-10-CM | POA: Diagnosis not present

## 2016-06-05 MED ORDER — CYANOCOBALAMIN 1000 MCG/ML IJ SOLN
1000.0000 ug | Freq: Once | INTRAMUSCULAR | Status: AC
  Administered 2016-06-05: 1000 ug via INTRAMUSCULAR

## 2016-06-06 ENCOUNTER — Ambulatory Visit (INDEPENDENT_AMBULATORY_CARE_PROVIDER_SITE_OTHER): Payer: Federal, State, Local not specified - PPO

## 2016-06-06 ENCOUNTER — Telehealth: Payer: Self-pay

## 2016-06-06 DIAGNOSIS — E538 Deficiency of other specified B group vitamins: Secondary | ICD-10-CM

## 2016-06-06 MED ORDER — CYANOCOBALAMIN 1000 MCG/ML IJ SOLN
1000.0000 ug | Freq: Once | INTRAMUSCULAR | Status: AC
  Administered 2016-06-06: 1000 ug via INTRAMUSCULAR

## 2016-06-06 NOTE — Telephone Encounter (Signed)
Pt came in today for B12. While in office pt stated that 2 days ago he was walking across the street when he bent down to pick something off the ground. Pt stated that his R foot got stuck and he fell. Pt stated it was the hardest fall he's ever had. Now experiencing R hip/thigh pain. Fairly bruised up. Having difficulty walking on R leg. Pt was really hard on himself as he continuously repeated "I was doing so good, and I went and messed it all up". Pt stated he would stay off his leg for awhile but didn't know if heat or ice would be better. Please advise.

## 2016-06-06 NOTE — Telephone Encounter (Signed)
I'm sorry to hear this.  I recommend applying ice and ibuprofen/tylenol for pain.  If no better or symptoms get worse after 3-4 days of trying conservative therapies, we can reassess and see if he needs to be evaluated.

## 2016-06-06 NOTE — Telephone Encounter (Signed)
Message relayed to patient. Verbalized understanding and denied questions.   

## 2016-06-10 DIAGNOSIS — G589 Mononeuropathy, unspecified: Secondary | ICD-10-CM | POA: Diagnosis not present

## 2016-06-10 DIAGNOSIS — M5416 Radiculopathy, lumbar region: Secondary | ICD-10-CM | POA: Diagnosis not present

## 2016-06-12 ENCOUNTER — Ambulatory Visit (INDEPENDENT_AMBULATORY_CARE_PROVIDER_SITE_OTHER): Payer: Federal, State, Local not specified - PPO | Admitting: *Deleted

## 2016-06-12 DIAGNOSIS — M5416 Radiculopathy, lumbar region: Secondary | ICD-10-CM | POA: Diagnosis not present

## 2016-06-12 DIAGNOSIS — E538 Deficiency of other specified B group vitamins: Secondary | ICD-10-CM

## 2016-06-12 DIAGNOSIS — G589 Mononeuropathy, unspecified: Secondary | ICD-10-CM | POA: Diagnosis not present

## 2016-06-12 MED ORDER — CYANOCOBALAMIN 1000 MCG/ML IJ SOLN: 1000.0000 ug | Freq: Once | INTRAMUSCULAR | Status: DC

## 2016-06-17 DIAGNOSIS — M5416 Radiculopathy, lumbar region: Secondary | ICD-10-CM | POA: Diagnosis not present

## 2016-06-17 DIAGNOSIS — G589 Mononeuropathy, unspecified: Secondary | ICD-10-CM | POA: Diagnosis not present

## 2016-06-18 DIAGNOSIS — F332 Major depressive disorder, recurrent severe without psychotic features: Secondary | ICD-10-CM | POA: Diagnosis not present

## 2016-06-19 DIAGNOSIS — M21372 Foot drop, left foot: Secondary | ICD-10-CM | POA: Diagnosis not present

## 2016-06-20 ENCOUNTER — Ambulatory Visit (INDEPENDENT_AMBULATORY_CARE_PROVIDER_SITE_OTHER): Payer: Federal, State, Local not specified - PPO | Admitting: Neurology

## 2016-06-20 DIAGNOSIS — G589 Mononeuropathy, unspecified: Secondary | ICD-10-CM | POA: Diagnosis not present

## 2016-06-20 DIAGNOSIS — E538 Deficiency of other specified B group vitamins: Secondary | ICD-10-CM

## 2016-06-20 DIAGNOSIS — F332 Major depressive disorder, recurrent severe without psychotic features: Secondary | ICD-10-CM | POA: Diagnosis not present

## 2016-06-20 DIAGNOSIS — M5416 Radiculopathy, lumbar region: Secondary | ICD-10-CM | POA: Diagnosis not present

## 2016-06-20 MED ORDER — CYANOCOBALAMIN 1000 MCG/ML IJ SOLN
1000.0000 ug | Freq: Once | INTRAMUSCULAR | Status: AC
  Administered 2016-06-20: 1000 ug via INTRAMUSCULAR

## 2016-06-20 NOTE — Progress Notes (Signed)
B12 injection to right deltoid with no apparent complications.   

## 2016-06-28 ENCOUNTER — Ambulatory Visit (INDEPENDENT_AMBULATORY_CARE_PROVIDER_SITE_OTHER): Payer: Federal, State, Local not specified - PPO

## 2016-06-28 DIAGNOSIS — E538 Deficiency of other specified B group vitamins: Secondary | ICD-10-CM | POA: Diagnosis not present

## 2016-06-28 MED ORDER — CYANOCOBALAMIN 1000 MCG/ML IJ SOLN
1000.0000 ug | Freq: Once | INTRAMUSCULAR | Status: AC
Start: 2016-06-28 — End: 2016-06-28
  Administered 2016-06-28: 1000 ug via INTRAMUSCULAR

## 2016-07-03 ENCOUNTER — Ambulatory Visit (INDEPENDENT_AMBULATORY_CARE_PROVIDER_SITE_OTHER): Payer: Federal, State, Local not specified - PPO | Admitting: *Deleted

## 2016-07-03 DIAGNOSIS — E538 Deficiency of other specified B group vitamins: Secondary | ICD-10-CM | POA: Diagnosis not present

## 2016-07-03 DIAGNOSIS — H905 Unspecified sensorineural hearing loss: Secondary | ICD-10-CM | POA: Diagnosis not present

## 2016-07-03 MED ORDER — CYANOCOBALAMIN 1000 MCG/ML IJ SOLN
1000.0000 ug | Freq: Once | INTRAMUSCULAR | Status: AC
  Administered 2016-07-03: 1000 ug via INTRAMUSCULAR

## 2016-07-10 ENCOUNTER — Ambulatory Visit: Payer: Federal, State, Local not specified - PPO | Admitting: Neurology

## 2016-07-15 ENCOUNTER — Ambulatory Visit (INDEPENDENT_AMBULATORY_CARE_PROVIDER_SITE_OTHER): Payer: Federal, State, Local not specified - PPO | Admitting: *Deleted

## 2016-07-15 DIAGNOSIS — E538 Deficiency of other specified B group vitamins: Secondary | ICD-10-CM | POA: Diagnosis not present

## 2016-07-15 MED ORDER — CYANOCOBALAMIN 1000 MCG/ML IJ SOLN
1000.0000 ug | Freq: Once | INTRAMUSCULAR | Status: AC
Start: 2016-07-15 — End: 2016-07-15
  Administered 2016-07-15: 1000 ug via INTRAMUSCULAR

## 2016-07-17 ENCOUNTER — Other Ambulatory Visit: Payer: Self-pay | Admitting: Internal Medicine

## 2016-07-19 NOTE — Telephone Encounter (Signed)
Sent to the pharmacy by e-scribe for 1 year.  Pt has upcoming cpx on 05/23/17

## 2016-08-13 ENCOUNTER — Ambulatory Visit (INDEPENDENT_AMBULATORY_CARE_PROVIDER_SITE_OTHER): Payer: Federal, State, Local not specified - PPO

## 2016-08-13 DIAGNOSIS — E538 Deficiency of other specified B group vitamins: Secondary | ICD-10-CM

## 2016-08-13 MED ORDER — CYANOCOBALAMIN 1000 MCG/ML IJ SOLN
1000.0000 ug | Freq: Once | INTRAMUSCULAR | Status: AC
  Administered 2016-08-13: 1000 ug via INTRAMUSCULAR

## 2016-08-20 DIAGNOSIS — F332 Major depressive disorder, recurrent severe without psychotic features: Secondary | ICD-10-CM | POA: Diagnosis not present

## 2016-09-13 ENCOUNTER — Ambulatory Visit (INDEPENDENT_AMBULATORY_CARE_PROVIDER_SITE_OTHER): Payer: Federal, State, Local not specified - PPO | Admitting: *Deleted

## 2016-09-13 DIAGNOSIS — E538 Deficiency of other specified B group vitamins: Secondary | ICD-10-CM

## 2016-09-13 MED ORDER — CYANOCOBALAMIN 1000 MCG/ML IJ SOLN
1000.0000 ug | Freq: Once | INTRAMUSCULAR | Status: AC
  Administered 2016-09-13: 1000 ug via INTRAMUSCULAR

## 2016-10-04 ENCOUNTER — Ambulatory Visit (INDEPENDENT_AMBULATORY_CARE_PROVIDER_SITE_OTHER): Payer: Federal, State, Local not specified - PPO

## 2016-10-04 DIAGNOSIS — E538 Deficiency of other specified B group vitamins: Secondary | ICD-10-CM

## 2016-10-04 MED ORDER — CYANOCOBALAMIN 1000 MCG/ML IJ SOLN
1000.0000 ug | Freq: Once | INTRAMUSCULAR | Status: AC
  Administered 2016-10-04: 1000 ug via INTRAMUSCULAR

## 2016-10-25 DIAGNOSIS — F332 Major depressive disorder, recurrent severe without psychotic features: Secondary | ICD-10-CM | POA: Diagnosis not present

## 2016-11-06 ENCOUNTER — Ambulatory Visit (INDEPENDENT_AMBULATORY_CARE_PROVIDER_SITE_OTHER): Payer: Federal, State, Local not specified - PPO | Admitting: *Deleted

## 2016-11-06 DIAGNOSIS — E538 Deficiency of other specified B group vitamins: Secondary | ICD-10-CM | POA: Diagnosis not present

## 2016-11-06 MED ORDER — CYANOCOBALAMIN 1000 MCG/ML IJ SOLN
1000.0000 ug | Freq: Once | INTRAMUSCULAR | Status: AC
  Administered 2016-11-06: 1000 ug via INTRAMUSCULAR

## 2016-12-11 ENCOUNTER — Ambulatory Visit (INDEPENDENT_AMBULATORY_CARE_PROVIDER_SITE_OTHER): Payer: Federal, State, Local not specified - PPO | Admitting: Neurology

## 2016-12-11 DIAGNOSIS — E538 Deficiency of other specified B group vitamins: Secondary | ICD-10-CM

## 2016-12-11 MED ORDER — CYANOCOBALAMIN 1000 MCG/ML IJ SOLN
1000.0000 ug | Freq: Once | INTRAMUSCULAR | Status: AC
  Administered 2016-12-11: 1000 ug via INTRAMUSCULAR

## 2016-12-11 NOTE — Progress Notes (Signed)
B12 injection to left deltoid with no apparent complications.   

## 2016-12-20 DIAGNOSIS — F332 Major depressive disorder, recurrent severe without psychotic features: Secondary | ICD-10-CM | POA: Diagnosis not present

## 2017-01-07 IMAGING — MR MR KNEE*L* WO/W CM
4 of 8 series · 14 of 40 positions shown · IV contrast (agent unspecified)
Comparison: None.

CLINICAL DATA: Left foot drop, query peroneal nerve injury.
Unicompartmental knee prosthesis.

EXAM:
MRI OF THE LEFT KNEE WITHOUT AND WITH CONTRAST
TECHNIQUE: Multiplanar, multisequence MR imaging of the knee was performed.
Modified protocol to account for metal artifact. No intravenous
contrast was administered.

[Series 3: T1 · axial · 3.5mm · 0.23mm/px · z∈[-67,+71]mm · 3 of 34 slices shown (1 of 2)]
[im 1/34]
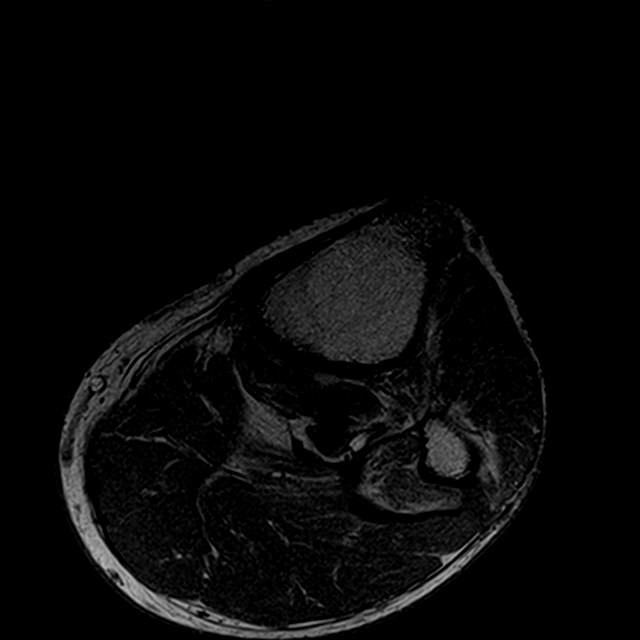
[im 17/34]
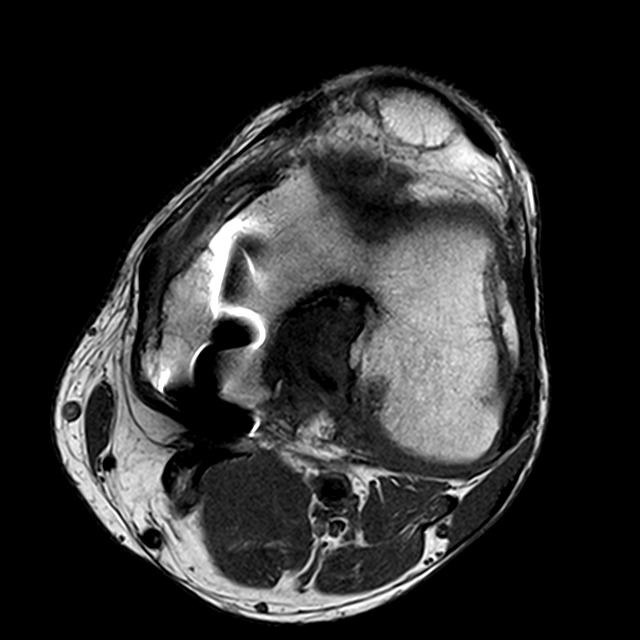
[im 34/34]
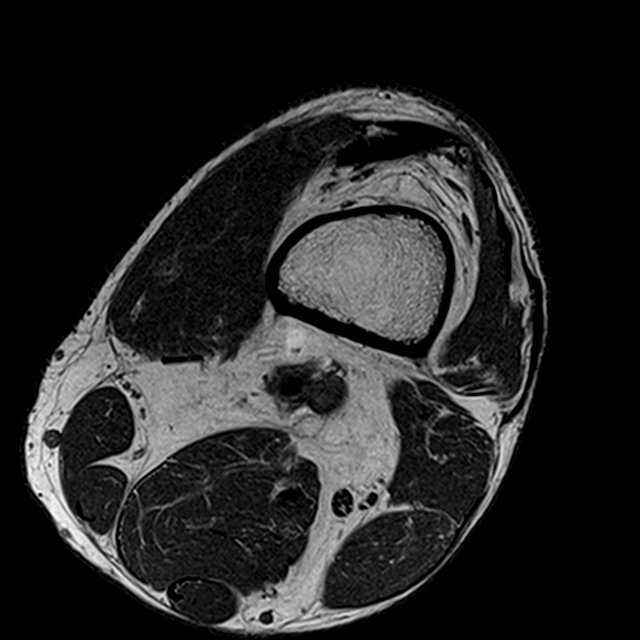

[Series 5: T1 · coronal · 3.5mm · 0.28mm/px · 3 of 27 slices shown (2 of 2)]
[im 1/27]
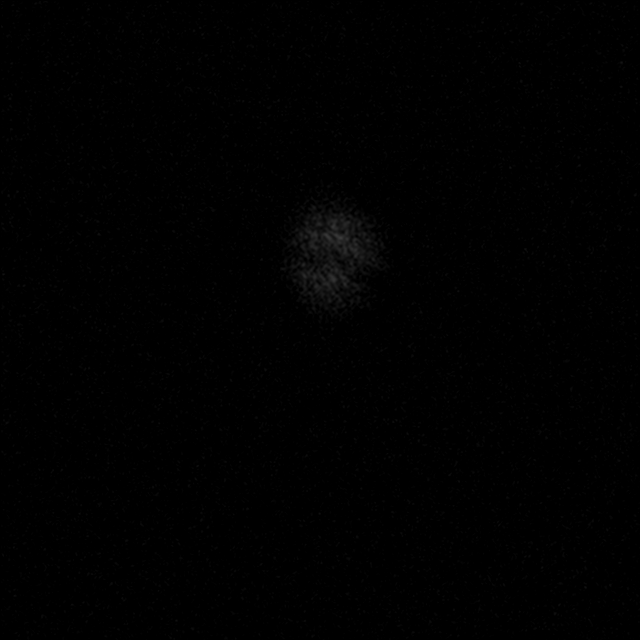
[im 18/27]
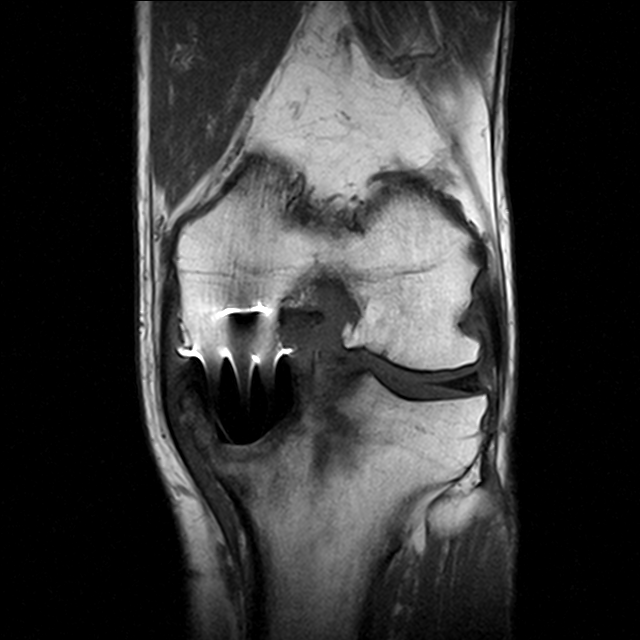
[im 27/27]
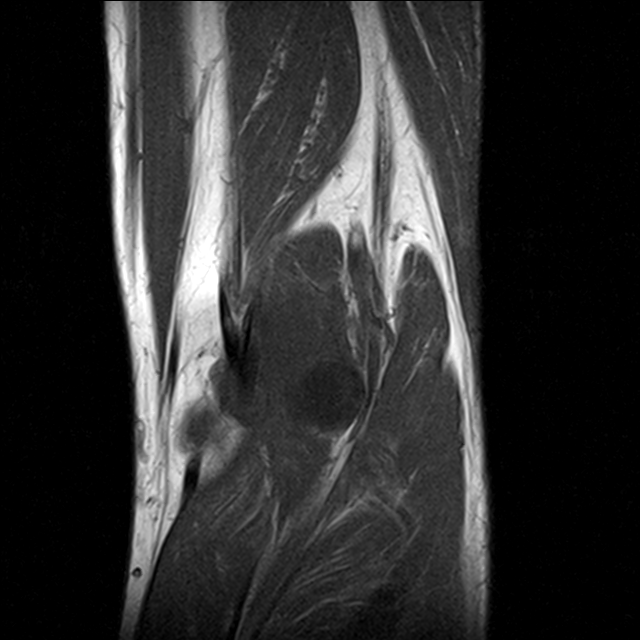

[Series 7: PD fat-sat · sagittal · 3.2mm · 0.33mm/px · 5 of 28 slices shown]
[im 1/28]
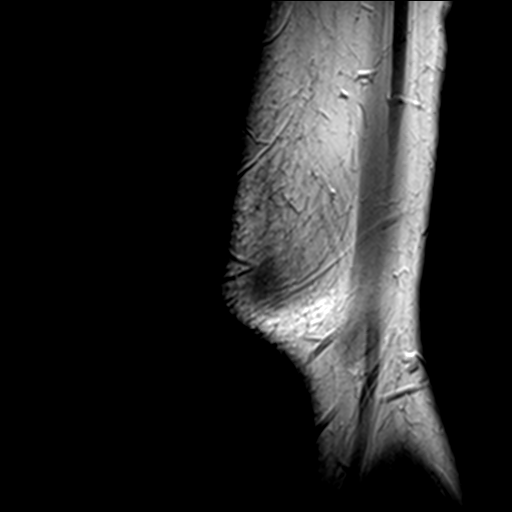
[im 7/28]
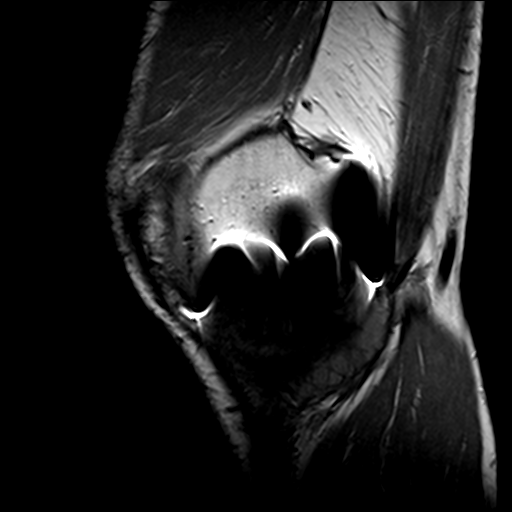
[im 14/28]
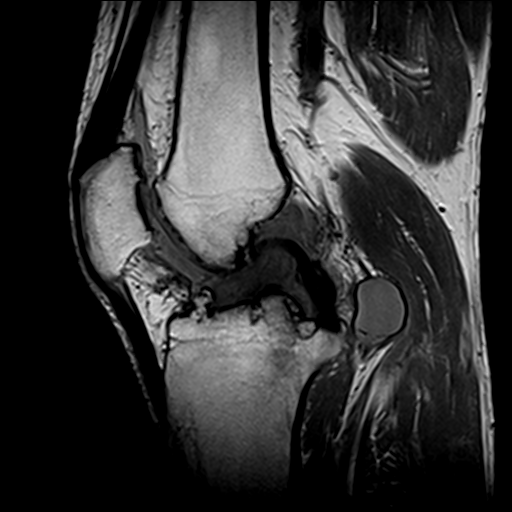
[im 21/28]
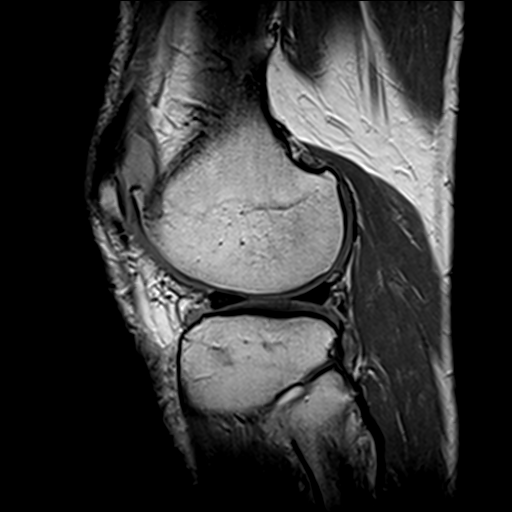
[im 28/28]
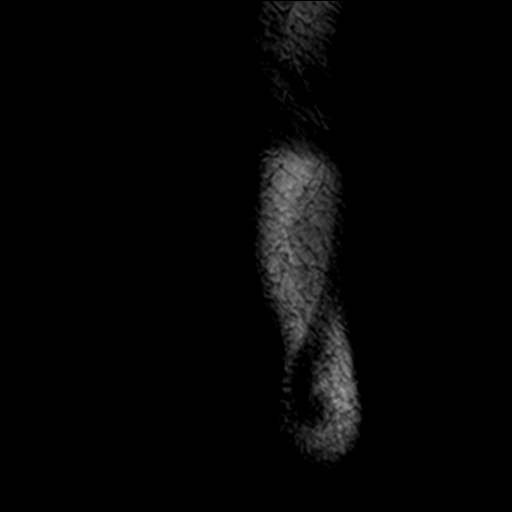

[Series 8: T1 fat-sat · axial · 3.5mm · 0.23mm/px · z∈[-42,+71]mm · 3 of 34 slices shown]
[im 7/34]
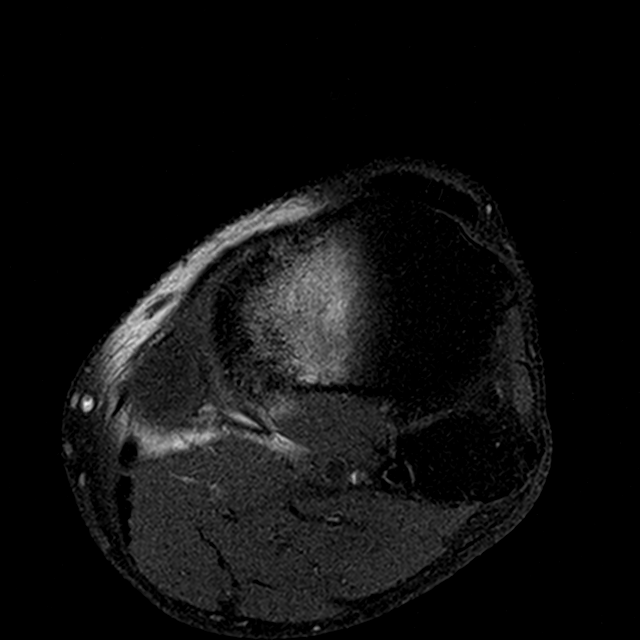
[im 20/34]
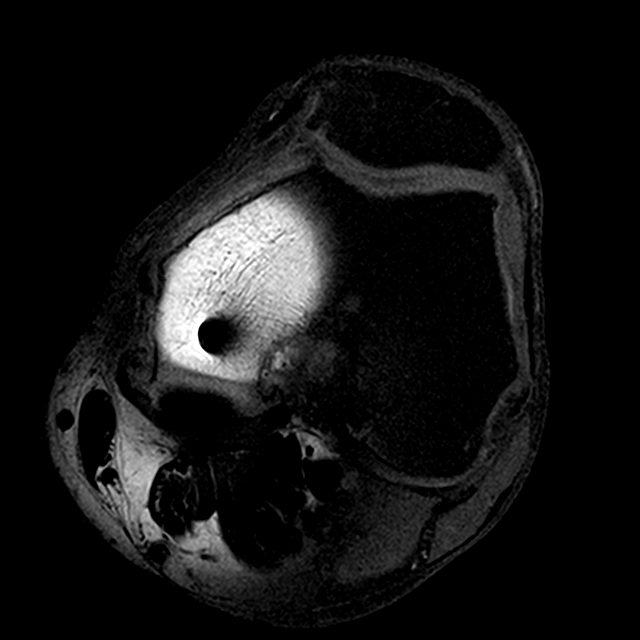
[im 34/34]
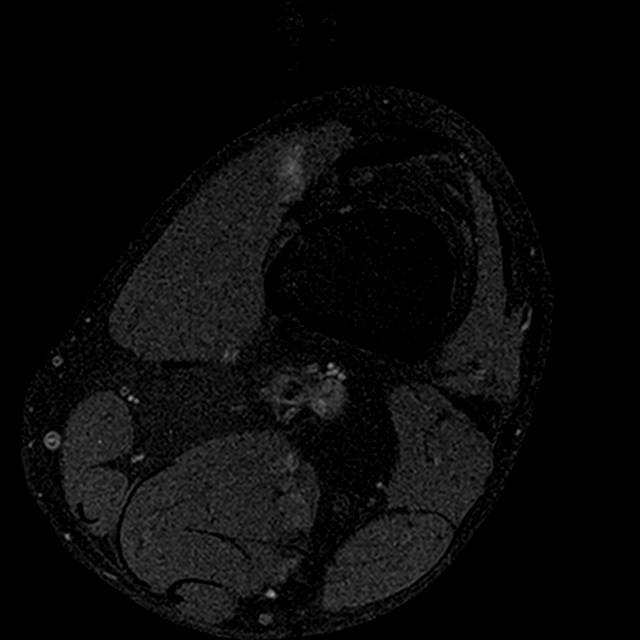

[14 of 40 positions shown; findings below may reference images not displayed]

FINDINGS: MENISCI

Medial meniscus: Medial compartmental prosthesis, this obscures the
medial compartment completely.

Lateral meniscus:  Grossly intact on proton density weighted images.

LIGAMENTS

Cruciates: Accentuated signal in the ACL which appears to likely be
attenuated by the prominent intercondylar notch spurring for example
image [DATE]. No overt ACL or PCL tear seen.

Collaterals:  Unremarkable

CARTILAGE

Patellofemoral: Chondral thinning and irregularity medially along
the medial patellar facet. Prominent spurring.

Medial:  Obscured by prosthesis.

Lateral:  Mild degenerative chondral thinning.  Marginal spurring.

Joint:  Small joint effusion with mild enhancing synovitis.

Popliteal Fossa: Atypical popliteal cyst extending anterior to the
medial head gastrocnemius on image [DATE], possibly reflecting a
component of semimembranosus tibial collateral ligament bursitis
given the fluid anteromedial to the semimembranosus, images 20-27
series 4.

Extensor Mechanism:  Unremarkable

Bones: Geodes along the tibial spine with adjacent marrow edema.
This marrow edema extends partially medially.

Other: There is no impingement along the perineal nerve in the
vicinity of the knee. No abnormal enhancement in this area.
IMPRESSION: 1. No peroneal nerve impingement in the vicinity of the knee. No
abnormal enhancement in this area.
2. Mild degenerative findings in the patellofemoral joint and
lateral compartment although the spurring is rather prominent.
3. Medial compartmental unicompartmental prosthesis.
4. Small joint effusion with mild synovitis.
5. Atypical popliteal cyst, possibly representing an extension of
semimembranosus -tibial collateral ligament bursitis.
6. Geodes along the tibial spine with adjacent marrow edema.
7. The intercondylar notch spurring abuts but does not tear of the
ACL.

## 2017-01-08 DIAGNOSIS — K080 Exfoliation of teeth due to systemic causes: Secondary | ICD-10-CM | POA: Diagnosis not present

## 2017-01-09 ENCOUNTER — Ambulatory Visit (INDEPENDENT_AMBULATORY_CARE_PROVIDER_SITE_OTHER): Payer: Federal, State, Local not specified - PPO | Admitting: *Deleted

## 2017-01-09 DIAGNOSIS — E538 Deficiency of other specified B group vitamins: Secondary | ICD-10-CM

## 2017-01-09 MED ORDER — CYANOCOBALAMIN 1000 MCG/ML IJ SOLN
1000.0000 ug | Freq: Once | INTRAMUSCULAR | Status: AC
  Administered 2017-01-09: 1000 ug via INTRAMUSCULAR

## 2017-02-06 ENCOUNTER — Ambulatory Visit (INDEPENDENT_AMBULATORY_CARE_PROVIDER_SITE_OTHER): Payer: Federal, State, Local not specified - PPO | Admitting: *Deleted

## 2017-02-06 DIAGNOSIS — E538 Deficiency of other specified B group vitamins: Secondary | ICD-10-CM | POA: Diagnosis not present

## 2017-02-06 MED ORDER — CYANOCOBALAMIN 1000 MCG/ML IJ SOLN
1000.0000 ug | Freq: Once | INTRAMUSCULAR | Status: AC
  Administered 2017-02-06: 1000 ug via INTRAMUSCULAR

## 2017-02-21 DIAGNOSIS — F332 Major depressive disorder, recurrent severe without psychotic features: Secondary | ICD-10-CM | POA: Diagnosis not present

## 2017-03-11 ENCOUNTER — Ambulatory Visit (INDEPENDENT_AMBULATORY_CARE_PROVIDER_SITE_OTHER): Payer: Federal, State, Local not specified - PPO | Admitting: Neurology

## 2017-03-11 DIAGNOSIS — E538 Deficiency of other specified B group vitamins: Secondary | ICD-10-CM | POA: Diagnosis not present

## 2017-03-11 MED ORDER — CYANOCOBALAMIN 1000 MCG/ML IJ SOLN
1000.0000 ug | Freq: Once | INTRAMUSCULAR | Status: AC
  Administered 2017-03-11: 1000 ug via INTRAMUSCULAR

## 2017-04-04 ENCOUNTER — Ambulatory Visit (INDEPENDENT_AMBULATORY_CARE_PROVIDER_SITE_OTHER): Payer: Federal, State, Local not specified - PPO | Admitting: *Deleted

## 2017-04-04 DIAGNOSIS — E538 Deficiency of other specified B group vitamins: Secondary | ICD-10-CM

## 2017-04-04 MED ORDER — CYANOCOBALAMIN 1000 MCG/ML IJ SOLN
1000.0000 ug | Freq: Once | INTRAMUSCULAR | Status: AC
Start: 2017-04-04 — End: 2017-04-04
  Administered 2017-04-04: 1000 ug via INTRAMUSCULAR

## 2017-05-02 ENCOUNTER — Ambulatory Visit (INDEPENDENT_AMBULATORY_CARE_PROVIDER_SITE_OTHER): Payer: Federal, State, Local not specified - PPO | Admitting: *Deleted

## 2017-05-02 DIAGNOSIS — E538 Deficiency of other specified B group vitamins: Secondary | ICD-10-CM | POA: Diagnosis not present

## 2017-05-02 MED ORDER — CYANOCOBALAMIN 1000 MCG/ML IJ SOLN
1000.0000 ug | Freq: Once | INTRAMUSCULAR | Status: AC
  Administered 2017-05-02: 1000 ug via INTRAMUSCULAR

## 2017-05-12 ENCOUNTER — Other Ambulatory Visit: Payer: Federal, State, Local not specified - PPO

## 2017-05-20 DIAGNOSIS — F332 Major depressive disorder, recurrent severe without psychotic features: Secondary | ICD-10-CM | POA: Diagnosis not present

## 2017-05-22 NOTE — Progress Notes (Signed)
Chief Complaint  Patient presents with  . Annual Exam    HPI: Patient  Randall Torres  66 y.o. comes in today for Preventive Health Care visit  And med management   b12 shot monthly per dr Posey Pronto.     To see in fall.   For numbness goot his b12 low normal  Seems to be doing better   Simvastatin.  HLD no se of med  NO cv pulm sx but did have   One day of  chest  tightness   And then got better .   Depression is doing ok .   On sertraline and clonopin at night  Per dr Elaina Hoops     Health Maintenance  Topic Date Due  . INFLUENZA VACCINE  04/16/2017  . Hepatitis C Screening  05/23/2018 (Originally [birth year])  . PNA vac Low Risk Adult (2 of 2 - PPSV23) 08/11/2017  . COLONOSCOPY  07/30/2023  . TETANUS/TDAP  12/05/2024   Health Maintenance Review LIFESTYLE:  Exercise:   Walks swims  Tobacco/ETS:n Alcohol:  no Sugar beverages: coke per day? Sleep:6-8 Drug use: no HH of 2 Work: ft 40     ROS:  GEN/ HEENT: No fever, significant weight changes sweats headaches vision problems hearing  Uses aids  asymmetrical  changes, CV/ PULM; No chest pain shortness of breath cough, syncope,edema  change in exercise tolerance. GI /GU: No adominal pain, vomiting, change in bowel habits. No blood in the stool. No significant GU symptoms. SKIN/HEME: ,no acute skin rashes suspicious lesions or bleeding. No lymphadenopathy, nodules, masses.  NEURO/ PSYCH:  No neurologic signs such as weakness numbness. No depression anxiety. IMM/ Allergy: No unusual infections.  Allergy .   REST of 12 system review negative except as per HPI   Past Medical History:  Diagnosis Date  . ADD (attention deficit disorder)   . ADJUSTMENT DISORDER 09/29/2007   Qualifier: Diagnosis of  By: Regis Bill MD, Standley Brooking   . BPH (benign prostatic hyperplasia)    has seen urologist  . Hyperlipidemia   . Inguinal hernia    bilateral  . Personal history of colonic adenoma 06/30/2008  . Rhinitis 12/31/2010    Past  Surgical History:  Procedure Laterality Date  . CYSTOSCOPY    . INGUINAL HERNIA REPAIR Bilateral 10/18/2014   Procedure: LAPAROSCOPIC BILATERAL INGUINAL HERNIA REPAIR  ;  Surgeon: Fanny Skates, MD;  Location: [facility];  Service: General;  Laterality: Bilateral;  . INSERTION OF MESH Bilateral 10/18/2014   Procedure: INSERTION OF MESH;  Surgeon: Fanny Skates, MD;  Location: [facility];  Service: General;  Laterality: Bilateral;  . KNEE ARTHROSCOPY    . TOTAL KNEE ARTHROPLASTY  11/08   medial compartmental     Family History  Problem Relation Age of Onset  . Diabetes Unknown   . Other Unknown        CVA age 54  . Other Unknown        sleep problems  . Myasthenia gravis Brother 56       deceased  . Hyperlipidemia Unknown   . Alcohol abuse Unknown        Uncle  . Other Mother   . Stroke Father   . Diabetes Father   . Throat cancer Sister   . Breast cancer Sister   . Healthy Son   . Neuropathy Neg Hx     Social History   Social History  . Marital status: Married    Spouse name: N/A  .  Number of children: N/A  . Years of education: N/A   Social History Main Topics  . Smoking status: Never Smoker  . Smokeless tobacco: Never Used  . Alcohol use No     Comment: Hx of alcohol abuse - none since 2010  . Drug use: No  . Sexual activity: Not Asked   Other Topics Concern  . None   Social History Narrative   Occupation: Education administrator.   Highest level of education: law school   Married   Exercise: Swims daily  5-6 days per week.    Goes to AA  5 day per week or so.      HH of 2 dog     Wife recent dx and rx for breast cancer    2 children.   Lives with wife in a 2 story home.     Outpatient Medications Prior to Visit  Medication Sig Dispense Refill  . AMBULATORY NON FORMULARY MEDICATION 1 Units by Other route daily. Left ankle/foot orthotic. 1 Units 0  . sertraline (ZOLOFT) 100 MG tablet Take 100 mg by mouth daily.    . simvastatin (ZOCOR) 40 MG tablet take 1 tablet  by mouth once daily 90 tablet 3  . Naproxen Sodium (ALEVE PO) Take by mouth. As needed     Facility-Administered Medications Prior to Visit  Medication Dose Route Frequency Provider Last Rate Last Dose  . cyanocobalamin ((VITAMIN B-12)) injection 1,000 mcg  1,000 mcg Intramuscular Once Patel, Donika K, DO         EXAM:  BP 120/86 (BP Location: Right Arm, Patient Position: Sitting, Cuff Size: Normal)   Pulse (!) 53   Temp 98.4 F (36.9 C) (Oral)   Ht _0  (1.727 m)   Wt 185 lb (83.9 kg)   BMI 28.13 kg/m   Body mass index is 28.13 kg/m. Wt Readings from Last 3 Encounters:  05/23/17 185 lb (83.9 kg)  05/22/16 162 lb 12.8 oz (73.8 kg)  05/16/16 166 lb 5 oz (75.4 kg)    Physical Exam: Vital signs reviewed TOI:ZTIW is a well-developed well-nourished alert cooperative    who appearsr stated age in no acute distress.  HEENT: normocephalic atraumatic , Eyes: PERRL EOM's full, conjunctiva clear, Nares: paten,t no deformity discharge or tenderness., Ears: no deformity EAC's clear TMs with normal landmarks. Mouth: clear OP, no lesions, edema.  Moist mucous membranes. Dentition in adequate repair. NECK: supple without masses, thyromegaly or bruits. CHEST/PULM:  Clear to auscultation and percussion breath sounds equal no wheeze , rales or rhonchi. No chest wall deformities or tenderness. Breast: normal by inspection . No dimpling, discharge, masses, tenderness or discharge . CV: PMI is nondisplaced, S1 S2 no gallops, murmurs, rubs. Peripheral pulses are full without delay.No JVD .  ABDOMEN: Bowel sounds normal nontender  No guard or rebound, no hepato splenomegal no CVA tenderness.  No hernia. Extremtities:  No clubbing cyanosis or edema, no acute joint swelling or redness no focal atrophy NEURO:  Oriented x3, cranial nerves 3-12 appear to be intact, no obvious focal weakness,gait within normal limits no abnormal reflexes  SKIN: No acute rashes normal turgor, color, no bruising or  petechiae. PSYCH: Oriented, good eye contact, no obvious depression anxiety, cognition and judgment appear normal. LN: no cervical axillary inguinal adenopathy Rectal no masses prostate 1+ normal  Lab Results  Component Value Date   WBC 7.2 05/23/2017   HGB 13.8 05/23/2017   HCT 42.0 05/23/2017   PLT 271.0 05/23/2017  GLUCOSE 92 05/23/2017   CHOL 181 05/23/2017   TRIG 66.0 05/23/2017   HDL 49.50 05/23/2017   LDLDIRECT 147.2 11/29/2009   LDLCALC 118 (H) 05/23/2017   ALT 15 05/23/2017   AST 22 05/23/2017   NA 139 05/23/2017   K 4.4 05/23/2017   CL 102 05/23/2017   CREATININE 0.79 05/23/2017   BUN 20 05/23/2017   CO2 32 05/23/2017   TSH 1.28 05/06/2016   PSA 1.04 05/23/2017   HGBA1C 5.7 05/23/2016    BP Readings from Last 3 Encounters:  05/23/17 120/86  05/22/16 136/80  05/16/16 138/84     Shared Decision Making Labs  And ordered today    ASSESSMENT AND PLAN:  Discussed the following assessment and plan:  Visit for preventive health examination - Plan: Basic metabolic panel, CBC with Differential/Platelet, Hepatic function panel, Lipid panel, PSA  Hyperlipidemia, unspecified hyperlipidemia type - Plan: Basic metabolic panel, CBC with Differential/Platelet, Hepatic function panel, Lipid panel, PSA  Medication management - Plan: Basic metabolic panel, CBC with Differential/Platelet, Hepatic function panel, Lipid panel, PSA, Vitamin B12  Screening PSA (prostate specific antigen) - Plan: PSA  Neuropathy - w low bw12  level checked today  - Plan: Basic metabolic panel, CBC with Differential/Platelet, Hepatic function panel, Lipid panel, PSA, Vitamin B12  Needs flu shot - Plan: Flu vaccine HIGH DOSE PF  Hearing aid worn Counseled. preventive issues Patient Care Team: Panosh, Standley Brooking, MD as PCP - General Claudie Revering (Psychiatry) Alda Berthold, DO as Consulting Physician (Neurology) Patient Instructions   Limit  Beverages with sugar and  Sweets .  Continue lifestyle intervention healthy eating and exercise .    Next year  Updated pneumovax    Preventive Care 43 Years and Older, Male Preventive care refers to lifestyle choices and visits with your health care provider that can promote health and wellness. What does preventive care include?  A yearly physical exam. This is also called an annual well check.  Dental exams once or twice a year.  Routine eye exams. Ask your health care provider how often you should have your eyes checked.  Personal lifestyle choices, including: ? Daily care of your teeth and gums. ? Regular physical activity. ? Eating a healthy diet. ? Avoiding tobacco and drug use. ? Limiting alcohol use. ? Practicing safe sex. ? Taking low doses of aspirin every day. ? Taking vitamin and mineral supplements as recommended by your health care provider. What happens during an annual well check? The services and screenings done by your health care provider during your annual well check will depend on your age, overall health, lifestyle risk factors, and family history of disease. Counseling Your health care provider may ask you questions about your:  Alcohol use.  Tobacco use.  Drug use.  Emotional well-being.  Home and relationship well-being.  Sexual activity.  Eating habits.  History of falls.  Memory and ability to understand (cognition).  Work and work Statistician.  Screening You may have the following tests or measurements:  Height, weight, and BMI.  Blood pressure.  Lipid and cholesterol levels. These may be checked every 5 years, or more frequently if you are over 57 years old.  Skin check.  Lung cancer screening. You may have this screening every year starting at age 1 if you have a 30-pack-year history of smoking and currently smoke or have quit within the past 15 years.  Fecal occult blood test (FOBT) of the stool. You may have this test every year  starting at age  65.  Flexible sigmoidoscopy or colonoscopy. You may have a sigmoidoscopy every 5 years or a colonoscopy every 10 years starting at age 69.  Prostate cancer screening. Recommendations will vary depending on your family history and other risks.  Hepatitis C blood test.  Hepatitis B blood test.  Sexually transmitted disease (STD) testing.  Diabetes screening. This is done by checking your blood sugar (glucose) after you have not eaten for a while (fasting). You may have this done every 1-3 years.  Abdominal aortic aneurysm (AAA) screening. You may need this if you are a current or former smoker.  Osteoporosis. You may be screened starting at age 40 if you are at high risk.  Talk with your health care provider about your test results, treatment options, and if necessary, the need for more tests. Vaccines Your health care provider may recommend certain vaccines, such as:  Influenza vaccine. This is recommended every year.  Tetanus, diphtheria, and acellular pertussis (Tdap, Td) vaccine. You may need a Td booster every 10 years.  Varicella vaccine. You may need this if you have not been vaccinated.  Zoster vaccine. You may need this after age 85.  Measles, mumps, and rubella (MMR) vaccine. You may need at least one dose of MMR if you were born in 1957 or later. You may also need a second dose.  Pneumococcal 13-valent conjugate (PCV13) vaccine. One dose is recommended after age 8.  Pneumococcal polysaccharide (PPSV23) vaccine. One dose is recommended after age 37.  Meningococcal vaccine. You may need this if you have certain conditions.  Hepatitis A vaccine. You may need this if you have certain conditions or if you travel or work in places where you may be exposed to hepatitis A.  Hepatitis B vaccine. You may need this if you have certain conditions or if you travel or work in places where you may be exposed to hepatitis B.  Haemophilus influenzae type b (Hib) vaccine. You may  need this if you have certain risk factors.  Talk to your health care provider about which screenings and vaccines you need and how often you need them. This information is not intended to replace advice given to you by your health care provider. Make sure you discuss any questions you have with your health care provider. Document Released: 09/29/2015 Document Revised: 05/22/2016 Document Reviewed: 07/04/2015 Elsevier Interactive Patient Education  2017 [place] K. Panosh M.D.

## 2017-05-23 ENCOUNTER — Encounter: Payer: Self-pay | Admitting: Internal Medicine

## 2017-05-23 ENCOUNTER — Ambulatory Visit (INDEPENDENT_AMBULATORY_CARE_PROVIDER_SITE_OTHER): Payer: Federal, State, Local not specified - PPO | Admitting: Internal Medicine

## 2017-05-23 VITALS — BP 120/86 | HR 53 | Temp 98.4°F | Ht 68.0 in | Wt 185.0 lb

## 2017-05-23 DIAGNOSIS — Z0000 Encounter for general adult medical examination without abnormal findings: Secondary | ICD-10-CM

## 2017-05-23 DIAGNOSIS — Z125 Encounter for screening for malignant neoplasm of prostate: Secondary | ICD-10-CM

## 2017-05-23 DIAGNOSIS — G629 Polyneuropathy, unspecified: Secondary | ICD-10-CM

## 2017-05-23 DIAGNOSIS — Z23 Encounter for immunization: Secondary | ICD-10-CM

## 2017-05-23 DIAGNOSIS — Z79899 Other long term (current) drug therapy: Secondary | ICD-10-CM | POA: Diagnosis not present

## 2017-05-23 DIAGNOSIS — E785 Hyperlipidemia, unspecified: Secondary | ICD-10-CM

## 2017-05-23 DIAGNOSIS — Z974 Presence of external hearing-aid: Secondary | ICD-10-CM

## 2017-05-23 LAB — CBC WITH DIFFERENTIAL/PLATELET
Basophils Absolute: 0.1 10*3/uL (ref 0.0–0.1)
Basophils Relative: 0.8 % (ref 0.0–3.0)
EOS ABS: 0.3 10*3/uL (ref 0.0–0.7)
EOS PCT: 4.6 % (ref 0.0–5.0)
HCT: 42 % (ref 39.0–52.0)
HEMOGLOBIN: 13.8 g/dL (ref 13.0–17.0)
Lymphocytes Relative: 32.4 % (ref 12.0–46.0)
Lymphs Abs: 2.3 10*3/uL (ref 0.7–4.0)
MCHC: 32.8 g/dL (ref 30.0–36.0)
MCV: 91 fl (ref 78.0–100.0)
MONOS PCT: 6.3 % (ref 3.0–12.0)
Monocytes Absolute: 0.5 10*3/uL (ref 0.1–1.0)
NEUTROS PCT: 55.9 % (ref 43.0–77.0)
Neutro Abs: 4 10*3/uL (ref 1.4–7.7)
PLATELETS: 271 10*3/uL (ref 150.0–400.0)
RBC: 4.62 Mil/uL (ref 4.22–5.81)
RDW: 13.4 % (ref 11.5–15.5)
WBC: 7.2 10*3/uL (ref 4.0–10.5)

## 2017-05-23 LAB — BASIC METABOLIC PANEL
BUN: 20 mg/dL (ref 6–23)
CHLORIDE: 102 meq/L (ref 96–112)
CO2: 32 mEq/L (ref 19–32)
Calcium: 9.4 mg/dL (ref 8.4–10.5)
Creatinine, Ser: 0.79 mg/dL (ref 0.40–1.50)
GFR: 103.73 mL/min (ref >60.00)
GLUCOSE: 92 mg/dL (ref 70–99)
POTASSIUM: 4.4 meq/L (ref 3.5–5.1)
SODIUM: 139 meq/L (ref 135–145)

## 2017-05-23 LAB — HEPATIC FUNCTION PANEL
ALBUMIN: 4.3 g/dL (ref 3.5–5.2)
ALT: 15 U/L (ref 0–53)
AST: 22 U/L (ref 0–37)
Alkaline Phosphatase: 59 U/L (ref 39–117)
Bilirubin, Direct: 0.1 mg/dL (ref 0.0–0.3)
Total Bilirubin: 0.7 mg/dL (ref 0.2–1.2)
Total Protein: 7.3 g/dL (ref 6.0–8.3)

## 2017-05-23 LAB — LIPID PANEL
CHOLESTEROL: 181 mg/dL (ref 0–200)
HDL: 49.5 mg/dL (ref >39.00)
LDL Cholesterol: 118 mg/dL — ABNORMAL HIGH (ref 0–99)
NonHDL: 131.14
Total CHOL/HDL Ratio: 4
Triglycerides: 66 mg/dL (ref 0.0–149.0)
VLDL: 13.2 mg/dL (ref 0.0–40.0)

## 2017-05-23 LAB — VITAMIN B12: Vitamin B-12: 517 pg/mL (ref 211–911)

## 2017-05-23 LAB — PSA: PSA: 1.04 ng/mL (ref 0.10–4.00)

## 2017-05-23 NOTE — Patient Instructions (Signed)
Limit  Beverages with sugar and  Sweets . Continue lifestyle intervention healthy eating and exercise .    Next year  Updated pneumovax    Preventive Care 16 Years and Older, Male Preventive care refers to lifestyle choices and visits with your health care provider that can promote health and wellness. What does preventive care include?  A yearly physical exam. This is also called an annual well check.  Dental exams once or twice a year.  Routine eye exams. Ask your health care provider how often you should have your eyes checked.  Personal lifestyle choices, including: ? Daily care of your teeth and gums. ? Regular physical activity. ? Eating a healthy diet. ? Avoiding tobacco and drug use. ? Limiting alcohol use. ? Practicing safe sex. ? Taking low doses of aspirin every day. ? Taking vitamin and mineral supplements as recommended by your health care provider. What happens during an annual well check? The services and screenings done by your health care provider during your annual well check will depend on your age, overall health, lifestyle risk factors, and family history of disease. Counseling Your health care provider may ask you questions about your:  Alcohol use.  Tobacco use.  Drug use.  Emotional well-being.  Home and relationship well-being.  Sexual activity.  Eating habits.  History of falls.  Memory and ability to understand (cognition).  Work and work Statistician.  Screening You may have the following tests or measurements:  Height, weight, and BMI.  Blood pressure.  Lipid and cholesterol levels. These may be checked every 5 years, or more frequently if you are over 73 years old.  Skin check.  Lung cancer screening. You may have this screening every year starting at age 50 if you have a 30-pack-year history of smoking and currently smoke or have quit within the past 15 years.  Fecal occult blood test (FOBT) of the stool. You may have  this test every year starting at age 56.  Flexible sigmoidoscopy or colonoscopy. You may have a sigmoidoscopy every 5 years or a colonoscopy every 10 years starting at age 81.  Prostate cancer screening. Recommendations will vary depending on your family history and other risks.  Hepatitis C blood test.  Hepatitis B blood test.  Sexually transmitted disease (STD) testing.  Diabetes screening. This is done by checking your blood sugar (glucose) after you have not eaten for a while (fasting). You may have this done every 1-3 years.  Abdominal aortic aneurysm (AAA) screening. You may need this if you are a current or former smoker.  Osteoporosis. You may be screened starting at age 75 if you are at high risk.  Talk with your health care provider about your test results, treatment options, and if necessary, the need for more tests. Vaccines Your health care provider may recommend certain vaccines, such as:  Influenza vaccine. This is recommended every year.  Tetanus, diphtheria, and acellular pertussis (Tdap, Td) vaccine. You may need a Td booster every 10 years.  Varicella vaccine. You may need this if you have not been vaccinated.  Zoster vaccine. You may need this after age 52.  Measles, mumps, and rubella (MMR) vaccine. You may need at least one dose of MMR if you were born in 1957 or later. You may also need a second dose.  Pneumococcal 13-valent conjugate (PCV13) vaccine. One dose is recommended after age 71.  Pneumococcal polysaccharide (PPSV23) vaccine. One dose is recommended after age 72.  Meningococcal vaccine. You may need this  if you have certain conditions.  Hepatitis A vaccine. You may need this if you have certain conditions or if you travel or work in places where you may be exposed to hepatitis A.  Hepatitis B vaccine. You may need this if you have certain conditions or if you travel or work in places where you may be exposed to hepatitis B.  Haemophilus  influenzae type b (Hib) vaccine. You may need this if you have certain risk factors.  Talk to your health care provider about which screenings and vaccines you need and how often you need them. This information is not intended to replace advice given to you by your health care provider. Make sure you discuss any questions you have with your health care provider. Document Released: 09/29/2015 Document Revised: 05/22/2016 Document Reviewed: 07/04/2015 Elsevier Interactive Patient Education  2017 [employer].

## 2017-06-05 ENCOUNTER — Ambulatory Visit (INDEPENDENT_AMBULATORY_CARE_PROVIDER_SITE_OTHER): Payer: Federal, State, Local not specified - PPO | Admitting: *Deleted

## 2017-06-05 DIAGNOSIS — E538 Deficiency of other specified B group vitamins: Secondary | ICD-10-CM

## 2017-06-05 MED ORDER — CYANOCOBALAMIN 1000 MCG/ML IJ SOLN
1000.0000 ug | Freq: Once | INTRAMUSCULAR | Status: AC
  Administered 2017-06-05: 1000 ug via INTRAMUSCULAR

## 2017-06-06 ENCOUNTER — Encounter: Payer: Self-pay | Admitting: Internal Medicine

## 2017-07-09 ENCOUNTER — Ambulatory Visit (INDEPENDENT_AMBULATORY_CARE_PROVIDER_SITE_OTHER): Payer: Federal, State, Local not specified - PPO | Admitting: *Deleted

## 2017-07-09 DIAGNOSIS — E538 Deficiency of other specified B group vitamins: Secondary | ICD-10-CM

## 2017-07-09 MED ORDER — CYANOCOBALAMIN 1000 MCG/ML IJ SOLN
1000.0000 ug | Freq: Once | INTRAMUSCULAR | Status: AC
  Administered 2017-07-09: 1000 ug via INTRAMUSCULAR

## 2017-07-10 DIAGNOSIS — K080 Exfoliation of teeth due to systemic causes: Secondary | ICD-10-CM | POA: Diagnosis not present

## 2017-08-01 ENCOUNTER — Ambulatory Visit (INDEPENDENT_AMBULATORY_CARE_PROVIDER_SITE_OTHER): Payer: Federal, State, Local not specified - PPO | Admitting: *Deleted

## 2017-08-01 ENCOUNTER — Other Ambulatory Visit: Payer: Self-pay | Admitting: Internal Medicine

## 2017-08-01 DIAGNOSIS — E538 Deficiency of other specified B group vitamins: Secondary | ICD-10-CM | POA: Diagnosis not present

## 2017-08-01 MED ORDER — CYANOCOBALAMIN 1000 MCG/ML IJ SOLN
1000.0000 ug | Freq: Once | INTRAMUSCULAR | Status: AC
  Administered 2017-08-01: 1000 ug via INTRAMUSCULAR

## 2017-08-06 ENCOUNTER — Ambulatory Visit: Payer: Federal, State, Local not specified - PPO | Admitting: Neurology

## 2017-08-14 DIAGNOSIS — F332 Major depressive disorder, recurrent severe without psychotic features: Secondary | ICD-10-CM | POA: Diagnosis not present

## 2017-08-26 ENCOUNTER — Ambulatory Visit: Payer: Federal, State, Local not specified - PPO | Admitting: Neurology

## 2017-08-28 ENCOUNTER — Ambulatory Visit (INDEPENDENT_AMBULATORY_CARE_PROVIDER_SITE_OTHER): Payer: Federal, State, Local not specified - PPO | Admitting: *Deleted

## 2017-08-28 DIAGNOSIS — E538 Deficiency of other specified B group vitamins: Secondary | ICD-10-CM | POA: Diagnosis not present

## 2017-08-28 MED ORDER — CYANOCOBALAMIN 1000 MCG/ML IJ SOLN
1000.0000 ug | Freq: Once | INTRAMUSCULAR | Status: AC
  Administered 2017-08-28: 1000 ug via INTRAMUSCULAR

## 2017-09-03 ENCOUNTER — Encounter: Payer: Self-pay | Admitting: Neurology

## 2017-09-03 ENCOUNTER — Ambulatory Visit (INDEPENDENT_AMBULATORY_CARE_PROVIDER_SITE_OTHER): Payer: Federal, State, Local not specified - PPO | Admitting: Neurology

## 2017-09-03 VITALS — BP 140/80 | HR 75 | Ht 68.0 in | Wt 190.5 lb

## 2017-09-03 DIAGNOSIS — G5732 Lesion of lateral popliteal nerve, left lower limb: Secondary | ICD-10-CM | POA: Diagnosis not present

## 2017-09-03 DIAGNOSIS — E538 Deficiency of other specified B group vitamins: Secondary | ICD-10-CM | POA: Diagnosis not present

## 2017-09-03 NOTE — Progress Notes (Signed)
Follow-up Visit   Date: 09/03/17    Randall Torres MRN: [id number] DOB: 07/01/1951   Interim History: Randall Torres is a 66 y.o. right-handed male with depression and hyperlipidemia, and ADD returning to the clinic for follow-up of left peroneal mononeuropathy and vitamin B12 deficiency.  The patient was accompanied to the clinic by self.  He is a Orthoptist.   History of present illness: He reports having numbness and tingling of the left dorsum and sole of the foot since the summer of 2016.  He also has mild tingling over the right heel, but he did not make much of the tingling as it did not bother him.  However, in mid-August, he was at church and when he stood up, he noticed weakness of the left foot such that he has to make a conscious effort to raise the left foot when walking.  He has suffered three falls and frequently stumbles.   He does not have any low back pain.  Symptoms have steadily become worse, because he has noticed that walking long distances takes more effort, especially uphill.    He has no history of diabetes or neuropathy.  He stopped drinking alcohol in 2010, and as previously drinking 3-4 beers/night for about two years.   UPDATE 12/19//2018:  He is here for follow-up visit.  Within a few months of starting vitamin B12 injections and exercises, he gradually started noticing improvement of his left foot drop.  He made a conscious decision to focus on his left foot when walking and feels that his attitude has significantly helped in his recovery. He walks about 2-3 miles daily and swims several times per week.  He no longer uses the AFO.  After a long day/walk, he can tell that the left foot may get a little weaker.  He expresses sincere appreciation for the care my office has provided and is very happy with his recovery.     Medications:  Current Outpatient Medications on File Prior to Visit  Medication Sig Dispense Refill  . AMBULATORY NON FORMULARY  MEDICATION 1 Units by Other route daily. Left ankle/foot orthotic. 1 Units 0  . clonazePAM (KLONOPIN) 0.5 MG tablet   0  . Naproxen Sodium (ALEVE PO) Take by mouth. As needed    . sertraline (ZOLOFT) 100 MG tablet Take 100 mg by mouth daily.    Marland Kitchen SHINGRIX injection inject 0.5 milliliter intramuscularly  0  . simvastatin (ZOCOR) 40 MG tablet take 1 tablet by mouth once daily 90 tablet 3   Current Facility-Administered Medications on File Prior to Visit  Medication Dose Route Frequency Provider Last Rate Last Dose  . cyanocobalamin ((VITAMIN B-12)) injection 1,000 mcg  1,000 mcg Intramuscular Once Narda Amber K, DO        Allergies: No Known Allergies  Review of Systems:  CONSTITUTIONAL: No fevers, chills, night sweats, or weight loss.  EYES: No visual changes or eye pain ENT: No hearing changes.  No history of nose bleeds.   RESPIRATORY: No cough, wheezing and shortness of breath.   CARDIOVASCULAR: Negative for chest pain, and palpitations.   GI: Negative for abdominal discomfort, blood in stools or black stools.  No recent change in bowel habits.   GU:  No history of incontinence.   MUSCLOSKELETAL: No history of joint pain or swelling.  No myalgias.   SKIN: Negative for lesions, rash, and itching.   ENDOCRINE: Negative for cold or heat intolerance, polydipsia or goiter.   PSYCH:  No depression or anxiety symptoms.   NEURO: As Above.   Vital Signs:  BP 140/80   Pulse 75   Ht _0  (1.727 m)   Wt 190 lb 8 oz (86.4 kg)   SpO2 94%   BMI 28.97 kg/m   General: Well appearing, comfortable  Neurological Exam: MENTAL STATUS including orientation to time, place, person, recent and remote memory, attention span and concentration, language, and fund of knowledge is normal.  Speech is not dysarthric.  CRANIAL NERVES:  Face is symmetric.   MOTOR:  Motor strength is 5/5 in all extremities, including bilateral dorsiflexion and toe extension (markedly improved!), eversion and inversion.   No atrophy, fasciculations or abnormal movements.  No pronator drift.  Tone is normal.    MSRs:  Reflexes are 2+/4 throughout, except 3+/4 bilateral patella.  SENSORY:  Intact to temperature and vibration throughout.  COORDINATION/GAIT:  Normal finger-to- nose-finger.  Intact rapid alternating movements bilaterally.  Gait narrow based and stable.  He is able to toe walk, but has some difficulty with heel walking.  Data: Lab Results  Component Value Date   [id number] 05/23/2017   Labs 05/23/2016:  ESR 20, ANA neg, CRP 0.3, vitamin B12 254, vitamin B1   MRI brain wwo contrast 05/07/2016:  No acute intracranial abnormality. Normal for age MRI appearance of the brain.  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 Anterolisthesis and mild disc bulging cause marked bilateral foraminal narrowing and compression of both exiting L5 roots. Small foraminal and extra foraminal protrusion on the left at L2-3 contacts the left L2 root without compression or displacement.  MRI knee left wwo contrast 05/22/2016: 1. No peroneal nerve impingement in the vicinity of the knee. No abnormal enhancement in this area. 2. Mild degenerative findings in the patellofemoral joint and lateral compartment although the spurring is rather prominent. 3. Medial compartmental unicompartmental prosthesis. 4. Small joint effusion with mild synovitis. 5. Atypical popliteal cyst, possibly representing an extension of semimembranosus -tibial collateral ligament bursitis. 6. Geodes along the tibial spine with adjacent marrow edema. 7. The intercondylar notch spurring abuts but does not tear of the ACL.  NCS/EMG of the left leg 05/21/2016: 1. 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 2. There is also evidence of a L5 lumbosacral radiculopathy affecting bilateral lower extremities,  mild in degree electrically.  IMPRESSION/PLAN: 1.  Left peroneal mononeuropathy - resolved.  He has impressive neurological recovery of left foot drop, previously with 0/5 motor strength in dorsiflexion and toe extension, and now 5/5 distal strength.  Praised him for being active and encouraged him to work on heel walking exercises to strength anterior compartment muscles.    2.  Vitamin B12 deficiency - improved.   He has responded remarkably well to parenteral supplementation and would prefer to continue 1016mg monthly injections.    Return to clinic as needed   Thank you for allowing me to participate in patient's care.  If I can answer any additional questions, I would be pleased to do so.    Sincerely,    Thurl Boen K. PPosey Pronto DO

## 2017-09-03 NOTE — Patient Instructions (Addendum)
It was lovely seeing you today and I am so happy to see how much your strength has improved.  Keep up the great work!  Continue to try heel walking exercises   Continue vitamin B12 monthly.  If you choose to start oral tablets, start vitamin B12 daily.

## 2017-10-01 ENCOUNTER — Ambulatory Visit (INDEPENDENT_AMBULATORY_CARE_PROVIDER_SITE_OTHER): Payer: Federal, State, Local not specified - PPO | Admitting: Neurology

## 2017-10-01 DIAGNOSIS — E538 Deficiency of other specified B group vitamins: Secondary | ICD-10-CM

## 2017-10-01 MED ORDER — CYANOCOBALAMIN 1000 MCG/ML IJ SOLN
1000.0000 ug | Freq: Once | INTRAMUSCULAR | Status: AC
Start: 2017-10-01 — End: 2017-10-01
  Administered 2017-10-01: 1000 ug via INTRAMUSCULAR

## 2017-10-01 NOTE — Progress Notes (Signed)
B12 injection to left deltoid with no apparent complications.   

## 2017-11-03 ENCOUNTER — Ambulatory Visit (INDEPENDENT_AMBULATORY_CARE_PROVIDER_SITE_OTHER): Payer: Federal, State, Local not specified - PPO | Admitting: *Deleted

## 2017-11-03 DIAGNOSIS — E538 Deficiency of other specified B group vitamins: Secondary | ICD-10-CM

## 2017-11-03 MED ORDER — CYANOCOBALAMIN 1000 MCG/ML IJ SOLN
1000.0000 ug | Freq: Once | INTRAMUSCULAR | Status: AC
  Administered 2017-11-03: 1000 ug via INTRAMUSCULAR

## 2017-11-04 ENCOUNTER — Ambulatory Visit: Payer: Federal, State, Local not specified - PPO

## 2017-11-12 DIAGNOSIS — F332 Major depressive disorder, recurrent severe without psychotic features: Secondary | ICD-10-CM | POA: Diagnosis not present

## 2017-12-01 ENCOUNTER — Ambulatory Visit (INDEPENDENT_AMBULATORY_CARE_PROVIDER_SITE_OTHER): Payer: Federal, State, Local not specified - PPO | Admitting: *Deleted

## 2017-12-01 DIAGNOSIS — E538 Deficiency of other specified B group vitamins: Secondary | ICD-10-CM | POA: Diagnosis not present

## 2017-12-01 MED ORDER — CYANOCOBALAMIN 1000 MCG/ML IJ SOLN
1000.0000 ug | Freq: Once | INTRAMUSCULAR | Status: AC
  Administered 2017-12-01: 1000 ug via INTRAMUSCULAR

## 2017-12-30 ENCOUNTER — Ambulatory Visit (INDEPENDENT_AMBULATORY_CARE_PROVIDER_SITE_OTHER): Payer: Federal, State, Local not specified - PPO | Admitting: *Deleted

## 2017-12-30 DIAGNOSIS — E538 Deficiency of other specified B group vitamins: Secondary | ICD-10-CM

## 2017-12-30 MED ORDER — CYANOCOBALAMIN 1000 MCG/ML IJ SOLN
1000.0000 ug | Freq: Once | INTRAMUSCULAR | Status: AC
  Administered 2017-12-30: 1000 ug via INTRAMUSCULAR

## 2018-01-20 DIAGNOSIS — K080 Exfoliation of teeth due to systemic causes: Secondary | ICD-10-CM | POA: Diagnosis not present

## 2018-01-29 ENCOUNTER — Ambulatory Visit (INDEPENDENT_AMBULATORY_CARE_PROVIDER_SITE_OTHER): Payer: Federal, State, Local not specified - PPO | Admitting: *Deleted

## 2018-01-29 DIAGNOSIS — F332 Major depressive disorder, recurrent severe without psychotic features: Secondary | ICD-10-CM | POA: Diagnosis not present

## 2018-01-29 DIAGNOSIS — E538 Deficiency of other specified B group vitamins: Secondary | ICD-10-CM | POA: Diagnosis not present

## 2018-01-29 MED ORDER — CYANOCOBALAMIN 1000 MCG/ML IJ SOLN
1000.0000 ug | Freq: Once | INTRAMUSCULAR | Status: AC
  Administered 2018-01-29: 1000 ug via INTRAMUSCULAR

## 2018-02-26 ENCOUNTER — Ambulatory Visit: Payer: Federal, State, Local not specified - PPO

## 2018-02-26 ENCOUNTER — Ambulatory Visit (INDEPENDENT_AMBULATORY_CARE_PROVIDER_SITE_OTHER): Payer: Federal, State, Local not specified - PPO | Admitting: *Deleted

## 2018-02-26 DIAGNOSIS — E538 Deficiency of other specified B group vitamins: Secondary | ICD-10-CM | POA: Diagnosis not present

## 2018-02-26 MED ORDER — CYANOCOBALAMIN 1000 MCG/ML IJ SOLN
1000.0000 ug | Freq: Once | INTRAMUSCULAR | Status: AC
  Administered 2018-02-26: 1000 ug via INTRAMUSCULAR

## 2018-04-02 ENCOUNTER — Ambulatory Visit (INDEPENDENT_AMBULATORY_CARE_PROVIDER_SITE_OTHER): Payer: Federal, State, Local not specified - PPO | Admitting: *Deleted

## 2018-04-02 ENCOUNTER — Other Ambulatory Visit: Payer: Federal, State, Local not specified - PPO

## 2018-04-02 DIAGNOSIS — E538 Deficiency of other specified B group vitamins: Secondary | ICD-10-CM

## 2018-04-27 DIAGNOSIS — F332 Major depressive disorder, recurrent severe without psychotic features: Secondary | ICD-10-CM | POA: Diagnosis not present

## 2018-04-30 ENCOUNTER — Ambulatory Visit (INDEPENDENT_AMBULATORY_CARE_PROVIDER_SITE_OTHER): Payer: Federal, State, Local not specified - PPO | Admitting: *Deleted

## 2018-04-30 DIAGNOSIS — E538 Deficiency of other specified B group vitamins: Secondary | ICD-10-CM | POA: Diagnosis not present

## 2018-04-30 MED ORDER — CYANOCOBALAMIN 1000 MCG/ML IJ SOLN
1000.0000 ug | Freq: Once | INTRAMUSCULAR | Status: AC
  Administered 2018-04-30: 1000 ug via INTRAMUSCULAR

## 2018-05-22 NOTE — Patient Instructions (Addendum)
bp goal below 140/90 and best 120-130/80 range  Adequate sleep avoid  Processed carbs   Dec portion size  And add resistance training .   Flu vaccine and  Updated pnuemovax today. Will notify you  of labs when available.  if all ok then yearly  cpx and labs .    Health Maintenance, Male A healthy lifestyle and preventive care is important for your health and wellness. Ask your health care provider about what schedule of regular examinations is right for you. What should I know about weight and diet? Eat a Healthy Diet  Eat plenty of vegetables, fruits, whole grains, low-fat dairy products, and lean protein.  Do not eat a lot of foods high in solid fats, added sugars, or salt.  Maintain a Healthy Weight Regular exercise can help you achieve or maintain a healthy weight. You should:  Do at least 150 minutes of exercise each week. The exercise should increase your heart rate and make you sweat (moderate-intensity exercise).  Do strength-training exercises at least twice a week.  Watch Your Levels of Cholesterol and Blood Lipids  Have your blood tested for lipids and cholesterol every 5 years starting at 69 years of age. If you are at high risk for heart disease, you should start having your blood tested when you are 69 years old. You may need to have your cholesterol levels checked more often if: ? Your lipid or cholesterol levels are high. ? You are older than 69 years of age. ? You are at high risk for heart disease.  What should I know about cancer screening? Many types of cancers can be detected early and may often be prevented. Lung Cancer  You should be screened every year for lung cancer if: ? You are a current smoker who has smoked for at least 30 years. ? You are a former smoker who has quit within the past 15 years.  Talk to your health care provider about your screening options, when you should start screening, and how often you should be screened.  Colorectal  Cancer  Routine colorectal cancer screening usually begins at 69 years of age and should be repeated every 5-10 years until you are 69 years old. You may need to be screened more often if early forms of precancerous polyps or small growths are found. Your health care provider may recommend screening at an earlier age if you have risk factors for colon cancer.  Your health care provider may recommend using home test kits to check for hidden blood in the stool.  A small camera at the end of a tube can be used to examine your colon (sigmoidoscopy or colonoscopy). This checks for the earliest forms of colorectal cancer.  Prostate and Testicular Cancer  Depending on your age and overall health, your health care provider may do certain tests to screen for prostate and testicular cancer.  Talk to your health care provider about any symptoms or concerns you have about testicular or prostate cancer.  Skin Cancer  Check your skin from head to toe regularly.  Tell your health care provider about any new moles or changes in moles, especially if: ? There is a change in a mole's size, shape, or color. ? You have a mole that is larger than a pencil eraser.  Always use sunscreen. Apply sunscreen liberally and repeat throughout the day.  Protect yourself by wearing long sleeves, pants, a wide-brimmed hat, and sunglasses when outside.  What should I know about heart  disease, diabetes, and high blood pressure?  If you are 5-35 years of age, have your blood pressure checked every 3-5 years. If you are 5 years of age or older, have your blood pressure checked every year. You should have your blood pressure measured twice-once when you are at a hospital or clinic, and once when you are not at a hospital or clinic. Record the average of the two measurements. To check your blood pressure when you are not at a hospital or clinic, you can use: ? An automated blood pressure machine at a pharmacy. ? A home blood  pressure monitor.  Talk to your health care provider about your target blood pressure.  If you are between 30-33 years old, ask your health care provider if you should take aspirin to prevent heart disease.  Have regular diabetes screenings by checking your fasting blood sugar level. ? If you are at a normal weight and have a low risk for diabetes, have this test once every three years after the age of 48. ? If you are overweight and have a high risk for diabetes, consider being tested at a younger age or more often.  A one-time screening for abdominal aortic aneurysm (AAA) by ultrasound is recommended for men aged 9-75 years who are current or former smokers. What should I know about preventing infection? Hepatitis B If you have a higher risk for hepatitis B, you should be screened for this virus. Talk with your health care provider to find out if you are at risk for hepatitis B infection. Hepatitis C Blood testing is recommended for:  Everyone born from [birth year] through 1965.  Anyone with known risk factors for hepatitis C.  Sexually Transmitted Diseases (STDs)  You should be screened each year for STDs including gonorrhea and chlamydia if: ? You are sexually active and are younger than 69 years of age. ? You are older than 69 years of age and your health care provider tells you that you are at risk for this type of infection. ? Your sexual activity has changed since you were last screened and you are at an increased risk for chlamydia or gonorrhea. Ask your health care provider if you are at risk.  Talk with your health care provider about whether you are at high risk of being infected with HIV. Your health care provider may recommend a prescription medicine to help prevent HIV infection.  What else can I do?  Schedule regular health, dental, and eye exams.  Stay current with your vaccines (immunizations).  Do not use any tobacco products, such as cigarettes, chewing tobacco, and  e-cigarettes. If you need help quitting, ask your health care provider.  Limit alcohol intake to no more than 2 drinks per day. One drink equals 12 ounces of beer, 5 ounces of wine, or 1 ounces of hard liquor.  Do not use street drugs.  Do not share needles.  Ask your health care provider for help if you need support or information about quitting drugs.  Tell your health care provider if you often feel depressed.  Tell your health care provider if you have ever been abused or do not feel safe at home. This information is not intended to replace advice given to you by your health care provider. Make sure you discuss any questions you have with your health care provider. Document Released: 02/29/2008 Document Revised: 05/01/2016 Document Reviewed: 06/06/2015 Elsevier Interactive Patient Education  [employer].

## 2018-05-22 NOTE — Progress Notes (Signed)
Chief Complaint  Patient presents with  . Annual Exam    Pt is scheduled with Urologist for urinary frequency.     HPI: Patient  Randall Torres  67 y.o. comes in today for Preventive Health Care visit  And Chronic disease management med check On lipid med no se  Neuropathy doing better on b12 shots  Mood stable same meds  Has sx frequency  like his father  To see urology  Trying idfferent hearing aids seem ok so far   Health Maintenance  Topic Date Due  . Hepatitis C Screening  24-Jul-1951  . PNA vac Low Risk Adult (2 of 2 - PPSV23) 08/11/2017  . INFLUENZA VACCINE  04/16/2018  . COLONOSCOPY  07/30/2023  . TETANUS/TDAP  12/05/2024   Health Maintenance Review LIFESTYLE:  Exercise:  Swim and other walk Tobacco/ETS:n Alcohol: n Sugar beverages:n Sleep:  6+  Drug use: no HH of 2 Work: ft  Continuing     ROS:  GEN/ HEENT: No fever, significant weight changes sweats headaches vision problems hearing changes, CV/ PULM; No chest pain shortness of breath cough, syncope,edema  change in exercise tolerance. GI /GU: No adominal pain, vomiting, change in bowel habits. No blood in the stool. s. SKIN/HEME: ,no acute skin rashes suspicious lesions or bleeding. No lymphadenopathy, nodules, masses.  NEURO/ PSYCH:  No neurologic signs such as weakness numbness. No depression anxiety. IMM/ Allergy: No unusual infections.  Allergy .   REST of 12 system review negative except as per HPI   Past Medical History:  Diagnosis Date  . ADD (attention deficit disorder)   . ADJUSTMENT DISORDER 09/29/2007   Qualifier: Diagnosis of  By: Fabian Sharp MD, Neta Mends   . BPH (benign prostatic hyperplasia)    has seen urologist  . Hyperlipidemia   . Inguinal hernia    bilateral  . Personal history of colonic adenoma 06/30/2008  . Rhinitis 12/31/2010    Past Surgical History:  Procedure Laterality Date  . CYSTOSCOPY    . INGUINAL HERNIA REPAIR Bilateral 10/18/2014   Procedure: LAPAROSCOPIC BILATERAL  INGUINAL HERNIA REPAIR  ;  Surgeon: Claud Kelp, MD;  Location: [facility];  Service: General;  Laterality: Bilateral;  . INSERTION OF MESH Bilateral 10/18/2014   Procedure: INSERTION OF MESH;  Surgeon: Claud Kelp, MD;  Location: [facility];  Service: General;  Laterality: Bilateral;  . KNEE ARTHROSCOPY    . TOTAL KNEE ARTHROPLASTY  11/08   medial compartmental     Family History  Problem Relation Age of Onset  . Diabetes Unknown   . Other Unknown        CVA age 88  . Other Unknown        sleep problems  . Myasthenia gravis Brother 32       deceased  . Hyperlipidemia Unknown   . Alcohol abuse Unknown        Uncle  . Other Mother   . Stroke Father   . Diabetes Father   . Throat cancer Sister   . Breast cancer Sister   . Healthy Son   . Neuropathy Neg Hx     Social History   Socioeconomic History  . Marital status: Married    Spouse name: Not on file  . Number of children: Not on file  . Years of education: Not on file  . Highest education level: Not on file  Occupational History  . Not on file  Social Needs  . Financial resource strain: Not on file  .  Food insecurity:    Worry: Not on file    Inability: Not on file  . Transportation needs:    Medical: Not on file    Non-medical: Not on file  Tobacco Use  . Smoking status: Never Smoker  . Smokeless tobacco: Never Used  Substance and Sexual Activity  . Alcohol use: No    Alcohol/week: 0.0 standard drinks    Comment: Hx of alcohol abuse - none since 2010  . Drug use: No  . Sexual activity: Not on file  Lifestyle  . Physical activity:    Days per week: Not on file    Minutes per session: Not on file  . Stress: Not on file  Relationships  . Social connections:    Talks on phone: Not on file    Gets together: Not on file    Attends religious service: Not on file    Active member of club or organization: Not on file    Attends meetings of clubs or organizations: Not on file    Relationship status: Not on file    Other Topics Concern  . Not on file  Social History Narrative   Occupation: Publishing rights manager.   Highest level of education: law school   Married   Exercise: Swims daily  5-6 days per week.    Goes to AA  5 day per week or so.      HH of 2 dog     Wife recent dx and rx for breast cancer    2 children.   Lives with wife in a 2 story home.     Outpatient Medications Prior to Visit  Medication Sig Dispense Refill  . clonazePAM (KLONOPIN) 0.5 MG tablet   0  . Naproxen Sodium (ALEVE PO) Take by mouth. As needed    . sertraline (ZOLOFT) 100 MG tablet Take 100 mg by mouth daily.    Marland Kitchen SHINGRIX injection inject 0.5 milliliter intramuscularly  0  . simvastatin (ZOCOR) 40 MG tablet take 1 tablet by mouth once daily 90 tablet 3  . AMBULATORY NON FORMULARY MEDICATION 1 Units by Other route daily. Left ankle/foot orthotic. (Patient not taking: Reported on 05/25/2018) 1 Units 0   Facility-Administered Medications Prior to Visit  Medication Dose Route Frequency Provider Last Rate Last Dose  . cyanocobalamin ((VITAMIN B-12)) injection 1,000 mcg  1,000 mcg Intramuscular Once Patel, Donika K, DO         EXAM:  BP 138/88 (BP Location: Right Arm, Patient Position: Sitting, Cuff Size: Normal)   Pulse 60   Temp 97.6 F (36.4 C) (Oral)   Ht 5' 7.5" (1.715 m)   Wt 194 lb 8 oz (88.2 kg)   BMI 30.01 kg/m   Body mass index is 30.01 kg/m. Wt Readings from Last 3 Encounters:  05/25/18 194 lb 8 oz (88.2 kg)  09/03/17 190 lb 8 oz (86.4 kg)  05/23/17 185 lb (83.9 kg)    Physical Exam: Vital signs reviewed UJW:JXBJ is a well-developed well-nourished alert cooperative    who appearsr stated age in no acute distress.  HEENT: normocephalic atraumatic , Eyes: PERRL EOM's full, conjunctiva clear, Nares: paten,t no deformity discharge or tenderness., Ears: no deformity EAC's clear TMs with normal landmarks. Mouth: clear OP, no lesions, edema.  Moist mucous membranes. Dentition in adequate repair. NECK:  supple without masses, thyromegaly or bruits. CHEST/PULM:  Clear to auscultation and percussion breath sounds equal no wheeze , rales or rhonchi. No chest wall deformities or tenderness.  CV: PMI is nondisplaced, S1 S2 no gallops, murmurs, rubs. Peripheral pulses are full without delay.No JVD .  ABDOMEN: Bowel sounds normal nontender  No guard or rebound, no hepato splenomegal no CVA tenderness.  No hernia. Extremtities:  No clubbing cyanosis or edema, no acute joint swelling or redness no focal atrophy NEURO:  Oriented x3, cranial nerves 3-12 appear to be intact, no obvious focal weakness,gait within normal limits no abnormal reflexes or asymmetrical SKIN: No acute rashes normal turgor, color, no bruising or petechiae.  thickened toenails  PSYCH: Oriented, good eye contact, no obvious depression anxiety, cognition and judgment appear normal. LN: no cervical axillary inguinal adenopathy  Lab Results  Component Value Date   WBC 6.3 05/25/2018   HGB 14.0 05/25/2018   HCT 41.0 05/25/2018   PLT 279.0 05/25/2018   GLUCOSE 89 05/25/2018   CHOL 174 05/25/2018   TRIG 58.0 05/25/2018   HDL 54.00 05/25/2018   LDLDIRECT 147.2 11/29/2009   LDLCALC 109 (H) 05/25/2018   ALT 24 05/25/2018   AST 29 05/25/2018   NA 137 05/25/2018   K 4.6 05/25/2018   CL 104 05/25/2018   CREATININE 0.77 05/25/2018   BUN 23 05/25/2018   CO2 29 05/25/2018   TSH 1.92 05/25/2018   PSA 0.77 05/25/2018   HGBA1C 5.7 05/23/2016    BP Readings from Last 3 Encounters:  05/25/18 138/88  09/03/17 140/80  05/23/17 120/86    Wt Readings from Last 3 Encounters:  05/25/18 194 lb 8 oz (88.2 kg)  09/03/17 190 lb 8 oz (86.4 kg)  05/23/17 185 lb (83.9 kg)     ASSESSMENT AND PLAN:  Discussed the following assessment and plan:  Visit for preventive health examination - Plan: Basic metabolic panel, CBC with Differential/Platelet, Hepatic function panel, Lipid panel, TSH, Vitamin B12  Medication management - Plan: Basic  metabolic panel, CBC with Differential/Platelet, Hepatic function panel, Lipid panel, TSH, Vitamin B12  Hyperlipidemia, unspecified hyperlipidemia type - Plan: Basic metabolic panel, CBC with Differential/Platelet, Hepatic function panel, Lipid panel, TSH, Vitamin B12  Screening PSA (prostate specific antigen) - Plan: PSA  Neuropathy - Plan: Basic metabolic panel, CBC with Differential/Platelet, Hepatic function panel, Lipid panel, TSH, Vitamin B12  Need for hepatitis C screening test - Plan: Hepatitis C antibody  Need for influenza vaccination - Plan: Flu vaccine HIGH DOSE PF (Fluzone High dose)  Need for pneumococcal vaccination - Plan: Pneumococcal polysaccharide vaccine 23-valent greater than or equal to 2yo subcutaneous/IM To see uro for frequently screening and monitoring labs today  Patient Care Team: Madelin Headings, MD as PCP - General Rajakumar Thotakura (Psychiatry) Glendale Chard, DO as Consulting Physician (Neurology) Patient Instructions  bp goal below 140/90 and best 120-130/80 range  Adequate sleep avoid  Processed carbs   Dec portion size  And add resistance training .   Flu vaccine and  Updated pnuemovax today. Will notify you  of labs when available.  if all ok then yearly  cpx and labs .    Health Maintenance, Male A healthy lifestyle and preventive care is important for your health and wellness. Ask your health care provider about what schedule of regular examinations is right for you. What should I know about weight and diet? Eat a Healthy Diet  Eat plenty of vegetables, fruits, whole grains, low-fat dairy products, and lean protein.  Do not eat a lot of foods high in solid fats, added sugars, or salt.  Maintain a Healthy Weight Regular exercise can help you achieve  or maintain a healthy weight. You should:  Do at least 150 minutes of exercise each week. The exercise should increase your heart rate and make you sweat (moderate-intensity exercise).  Do  strength-training exercises at least twice a week.  Watch Your Levels of Cholesterol and Blood Lipids  Have your blood tested for lipids and cholesterol every 5 years starting at 69 years of age. If you are at high risk for heart disease, you should start having your blood tested when you are 69 years old. You may need to have your cholesterol levels checked more often if: ? Your lipid or cholesterol levels are high. ? You are older than 69 years of age. ? You are at high risk for heart disease.  What should I know about cancer screening? Many types of cancers can be detected early and may often be prevented. Lung Cancer  You should be screened every year for lung cancer if: ? You are a current smoker who has smoked for at least 30 years. ? You are a former smoker who has quit within the past 15 years.  Talk to your health care provider about your screening options, when you should start screening, and how often you should be screened.  Colorectal Cancer  Routine colorectal cancer screening usually begins at 69 years of age and should be repeated every 5-10 years until you are 69 years old. You may need to be screened more often if early forms of precancerous polyps or small growths are found. Your health care provider may recommend screening at an earlier age if you have risk factors for colon cancer.  Your health care provider may recommend using home test kits to check for hidden blood in the stool.  A small camera at the end of a tube can be used to examine your colon (sigmoidoscopy or colonoscopy). This checks for the earliest forms of colorectal cancer.  Prostate and Testicular Cancer  Depending on your age and overall health, your health care provider may do certain tests to screen for prostate and testicular cancer.  Talk to your health care provider about any symptoms or concerns you have about testicular or prostate cancer.  Skin Cancer  Check your skin from head to toe  regularly.  Tell your health care provider about any new moles or changes in moles, especially if: ? There is a change in a mole's size, shape, or color. ? You have a mole that is larger than a pencil eraser.  Always use sunscreen. Apply sunscreen liberally and repeat throughout the day.  Protect yourself by wearing long sleeves, pants, a wide-brimmed hat, and sunglasses when outside.  What should I know about heart disease, diabetes, and high blood pressure?  If you are 72-51 years of age, have your blood pressure checked every 3-5 years. If you are 72 years of age or older, have your blood pressure checked every year. You should have your blood pressure measured twice-once when you are at a hospital or clinic, and once when you are not at a hospital or clinic. Record the average of the two measurements. To check your blood pressure when you are not at a hospital or clinic, you can use: ? An automated blood pressure machine at a pharmacy. ? A home blood pressure monitor.  Talk to your health care provider about your target blood pressure.  If you are between 16-55 years old, ask your health care provider if you should take aspirin to prevent heart disease.  Have  regular diabetes screenings by checking your fasting blood sugar level. ? If you are at a normal weight and have a low risk for diabetes, have this test once every three years after the age of 82. ? If you are overweight and have a high risk for diabetes, consider being tested at a younger age or more often.  A one-time screening for abdominal aortic aneurysm (AAA) by ultrasound is recommended for men aged 65-75 years who are current or former smokers. What should I know about preventing infection? Hepatitis B If you have a higher risk for hepatitis B, you should be screened for this virus. Talk with your health care provider to find out if you are at risk for hepatitis B infection. Hepatitis C Blood testing is recommended  for:  Everyone born from [birth year] through 1967.  Anyone with known risk factors for hepatitis C.  Sexually Transmitted Diseases (STDs)  You should be screened each year for STDs including gonorrhea and chlamydia if: ? You are sexually active and are younger than 69 years of age. ? You are older than 69 years of age and your health care provider tells you that you are at risk for this type of infection. ? Your sexual activity has changed since you were last screened and you are at an increased risk for chlamydia or gonorrhea. Ask your health care provider if you are at risk.  Talk with your health care provider about whether you are at high risk of being infected with HIV. Your health care provider may recommend a prescription medicine to help prevent HIV infection.  What else can I do?  Schedule regular health, dental, and eye exams.  Stay current with your vaccines (immunizations).  Do not use any tobacco products, such as cigarettes, chewing tobacco, and e-cigarettes. If you need help quitting, ask your health care provider.  Limit alcohol intake to no more than 2 drinks per day. One drink equals 12 ounces of beer, 5 ounces of wine, or 1 ounces of hard liquor.  Do not use street drugs.  Do not share needles.  Ask your health care provider for help if you need support or information about quitting drugs.  Tell your health care provider if you often feel depressed.  Tell your health care provider if you have ever been abused or do not feel safe at home. This information is not intended to replace advice given to you by your health care provider. Make sure you discuss any questions you have with your health care provider. Document Released: 02/29/2008 Document Revised: 05/01/2016 Document Reviewed: 06/06/2015 Elsevier Interactive Patient Education  2018 [employer].    Lyman. Mitsuo Budnick M.D.

## 2018-05-25 ENCOUNTER — Ambulatory Visit (INDEPENDENT_AMBULATORY_CARE_PROVIDER_SITE_OTHER): Payer: Federal, State, Local not specified - PPO | Admitting: Internal Medicine

## 2018-05-25 ENCOUNTER — Encounter: Payer: Self-pay | Admitting: Internal Medicine

## 2018-05-25 VITALS — BP 138/88 | HR 60 | Temp 97.6°F | Ht 67.5 in | Wt 194.5 lb

## 2018-05-25 DIAGNOSIS — E785 Hyperlipidemia, unspecified: Secondary | ICD-10-CM

## 2018-05-25 DIAGNOSIS — Z0000 Encounter for general adult medical examination without abnormal findings: Secondary | ICD-10-CM | POA: Diagnosis not present

## 2018-05-25 DIAGNOSIS — G629 Polyneuropathy, unspecified: Secondary | ICD-10-CM

## 2018-05-25 DIAGNOSIS — Z1159 Encounter for screening for other viral diseases: Secondary | ICD-10-CM

## 2018-05-25 DIAGNOSIS — Z79899 Other long term (current) drug therapy: Secondary | ICD-10-CM | POA: Diagnosis not present

## 2018-05-25 DIAGNOSIS — Z125 Encounter for screening for malignant neoplasm of prostate: Secondary | ICD-10-CM | POA: Diagnosis not present

## 2018-05-25 DIAGNOSIS — Z23 Encounter for immunization: Secondary | ICD-10-CM | POA: Diagnosis not present

## 2018-05-25 DIAGNOSIS — H9190 Unspecified hearing loss, unspecified ear: Secondary | ICD-10-CM | POA: Insufficient documentation

## 2018-05-25 LAB — LIPID PANEL
CHOL/HDL RATIO: 3
CHOLESTEROL: 174 mg/dL (ref 0–200)
HDL: 54 mg/dL (ref >39.00)
LDL CALC: 109 mg/dL — AB (ref 0–99)
NONHDL: 120.46
Triglycerides: 58 mg/dL (ref 0.0–149.0)
VLDL: 11.6 mg/dL (ref 0.0–40.0)

## 2018-05-25 LAB — BASIC METABOLIC PANEL
BUN: 23 mg/dL (ref 6–23)
CALCIUM: 9.3 mg/dL (ref 8.4–10.5)
CHLORIDE: 104 meq/L (ref 96–112)
CO2: 29 meq/L (ref 19–32)
Creatinine, Ser: 0.77 mg/dL (ref 0.40–1.50)
GFR: 106.52 mL/min (ref >60.00)
GLUCOSE: 89 mg/dL (ref 70–99)
POTASSIUM: 4.6 meq/L (ref 3.5–5.1)
Sodium: 137 mEq/L (ref 135–145)

## 2018-05-25 LAB — CBC WITH DIFFERENTIAL/PLATELET
BASOS ABS: 0 10*3/uL (ref 0.0–0.1)
Basophils Relative: 0.8 % (ref 0.0–3.0)
EOS ABS: 0.3 10*3/uL (ref 0.0–0.7)
EOS PCT: 4.7 % (ref 0.0–5.0)
HCT: 41 % (ref 39.0–52.0)
Hemoglobin: 14 g/dL (ref 13.0–17.0)
LYMPHS PCT: 36.2 % (ref 12.0–46.0)
Lymphs Abs: 2.3 10*3/uL (ref 0.7–4.0)
MCHC: 34.1 g/dL (ref 30.0–36.0)
MCV: 88.6 fl (ref 78.0–100.0)
MONO ABS: 0.4 10*3/uL (ref 0.1–1.0)
MONOS PCT: 7 % (ref 3.0–12.0)
Neutro Abs: 3.2 10*3/uL (ref 1.4–7.7)
Neutrophils Relative %: 51.3 % (ref 43.0–77.0)
Platelets: 279 10*3/uL (ref 150.0–400.0)
RBC: 4.63 Mil/uL (ref 4.22–5.81)
RDW: 13.3 % (ref 11.5–15.5)
WBC: 6.3 10*3/uL (ref 4.0–10.5)

## 2018-05-25 LAB — HEPATIC FUNCTION PANEL
ALK PHOS: 52 U/L (ref 39–117)
ALT: 24 U/L (ref 0–53)
AST: 29 U/L (ref 0–37)
Albumin: 4.2 g/dL (ref 3.5–5.2)
BILIRUBIN DIRECT: 0.1 mg/dL (ref 0.0–0.3)
BILIRUBIN TOTAL: 0.7 mg/dL (ref 0.2–1.2)
Total Protein: 7.3 g/dL (ref 6.0–8.3)

## 2018-05-25 LAB — PSA: PSA: 0.77 ng/mL (ref 0.10–4.00)

## 2018-05-25 LAB — TSH: TSH: 1.92 u[IU]/mL (ref 0.35–4.50)

## 2018-05-25 LAB — VITAMIN B12: Vitamin B-12: 509 pg/mL (ref 211–911)

## 2018-05-26 LAB — HEPATITIS C ANTIBODY
HEP C AB: NONREACTIVE
SIGNAL TO CUT-OFF: 0.03 (ref <1.00)

## 2018-05-28 ENCOUNTER — Encounter: Payer: Self-pay | Admitting: *Deleted

## 2018-06-04 ENCOUNTER — Ambulatory Visit (INDEPENDENT_AMBULATORY_CARE_PROVIDER_SITE_OTHER): Payer: Federal, State, Local not specified - PPO | Admitting: *Deleted

## 2018-06-04 DIAGNOSIS — E538 Deficiency of other specified B group vitamins: Secondary | ICD-10-CM | POA: Diagnosis not present

## 2018-06-04 MED ORDER — CYANOCOBALAMIN 1000 MCG/ML IJ SOLN
1000.0000 ug | Freq: Once | INTRAMUSCULAR | Status: AC
  Administered 2018-06-04: 1000 ug via INTRAMUSCULAR

## 2018-06-18 ENCOUNTER — Telehealth: Payer: Self-pay | Admitting: Family Medicine

## 2018-06-18 NOTE — Telephone Encounter (Signed)
Copied from CRM [id number]. Topic: Medical Record Request - Provider/Facility Request >> Jun 18, 2018  9:10 AM Burchel, Abbi R wrote: Junious Dresser [facility]) requesting pt's most recent labs faxed to:   Fax [phone]  Phone: [phone]

## 2018-06-20 NOTE — Telephone Encounter (Signed)
Faxed last labs

## 2018-06-26 ENCOUNTER — Ambulatory Visit (INDEPENDENT_AMBULATORY_CARE_PROVIDER_SITE_OTHER): Payer: Federal, State, Local not specified - PPO | Admitting: *Deleted

## 2018-06-26 DIAGNOSIS — R350 Frequency of micturition: Secondary | ICD-10-CM | POA: Diagnosis not present

## 2018-06-26 DIAGNOSIS — E538 Deficiency of other specified B group vitamins: Secondary | ICD-10-CM

## 2018-06-26 DIAGNOSIS — N401 Enlarged prostate with lower urinary tract symptoms: Secondary | ICD-10-CM | POA: Diagnosis not present

## 2018-06-26 MED ORDER — CYANOCOBALAMIN 1000 MCG/ML IJ SOLN
1000.0000 ug | Freq: Once | INTRAMUSCULAR | Status: AC
  Administered 2018-06-26: 1000 ug via INTRAMUSCULAR

## 2018-07-18 ENCOUNTER — Ambulatory Visit (HOSPITAL_COMMUNITY)
Admission: EM | Admit: 2018-07-18 | Discharge: 2018-07-18 | Disposition: A | Discharge status: Home / Self Care | Payer: Federal, State, Local not specified - PPO

## 2018-07-18 ENCOUNTER — Other Ambulatory Visit: Payer: Self-pay

## 2018-07-18 ENCOUNTER — Encounter (HOSPITAL_COMMUNITY): Payer: Self-pay | Admitting: Emergency Medicine

## 2018-07-18 DIAGNOSIS — S61207A Unspecified open wound of left little finger without damage to nail, initial encounter: Secondary | ICD-10-CM | POA: Diagnosis not present

## 2018-07-18 DIAGNOSIS — W260XXA Contact with knife, initial encounter: Secondary | ICD-10-CM

## 2018-07-18 DIAGNOSIS — S61209A Unspecified open wound of unspecified finger without damage to nail, initial encounter: Secondary | ICD-10-CM

## 2018-07-18 NOTE — Discharge Instructions (Signed)
Keep this dressing in place for the next 24 hours to allow some healing and prevent further bleeding.  May removed dressing tomorrow.  May have some slight oozing of blood.  If this recurs hold pressure and elevate hand for approximately 10 minutes which should help.  That that point wash daily with soap and water. Keep covered to keep clean.  I would avoid the pool for the next few days to allow some healing to occur. Then after in pool ensure wash thoroughly with soap and water.  If develop increased pain, redness, pus or thick drainage or otherwise concerned about infection please return to be seen.

## 2018-07-18 NOTE — ED Provider Notes (Signed)
[facility]    CSN: [id number] Arrival date & time: 07/18/18  1006     History   Chief Complaint Chief Complaint  Patient presents with  . Laceration    HPI Randall Torres is a 67 y.o. male.   Randall Torres presents with complaints of laceration to left hand pinky finger. He was cutting old bread to put out for the birds, at approximately 0700 this morning. Minimal pain but states he has been unable to control the bleeding. Not on blood thinner. Nail is not involved, sliced the very tip of the finger. No numbness or tingling. No bony tenderness. Has cleansed the wound thoroughly with soap and water multiple times. Still oozing. Tried an OTC treatment to help stop bleeding which did not help. Tdap in 2016. Hx of bph, add, hyperlipidemia.    ROS per HPI.      Past Medical History:  Diagnosis Date  . ADD (attention deficit disorder)   . ADJUSTMENT DISORDER 09/29/2007   Qualifier: Diagnosis of  By: Fabian Sharp MD, Neta Mends   . BPH (benign prostatic hyperplasia)    has seen urologist  . Hyperlipidemia   . Inguinal hernia    bilateral  . Personal history of colonic adenoma 06/30/2008  . Rhinitis 12/31/2010    Patient Active Problem List   Diagnosis Date Noted  . Decreased hearing 05/25/2018  . B12 deficiency 09/03/2017  . Foraminal stenosis of lumbosacral region 05/16/2016  . Left foot drop 05/16/2016  . Neuropathy 05/03/2016  . BPH (benign prostatic hyperplasia)   . Visit for preventive health examination 12/31/2010  . 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 Personal history of colonic adenoma 06/30/2008  . HYPERLIPIDEMIA 09/29/2007  . ERECTILE DYSFUNCTION 09/29/2007  . ADD 09/29/2007  . DEGENERATIVE JOINT DISEASE, LEFT KNEE 09/29/2007    Past Surgical History:  Procedure Laterality Date  . CYSTOSCOPY    . INGUINAL HERNIA REPAIR Bilateral 10/18/2014   Procedure: LAPAROSCOPIC BILATERAL INGUINAL HERNIA REPAIR  ;  Surgeon: Claud Kelp, MD;  Location: [facility];  Service: General;  Laterality: Bilateral;  . INSERTION OF MESH Bilateral 10/18/2014   Procedure: INSERTION OF MESH;  Surgeon: Claud Kelp, MD;  Location: [facility];  Service: General;  Laterality: Bilateral;  . KNEE ARTHROSCOPY    . TOTAL KNEE ARTHROPLASTY  11/08   medial compartmental        Home Medications    Prior to Admission medications   Medication Sig Start Date End Date Taking? Authorizing Provider  clonazePAM (KLONOPIN) 0.5 MG tablet  04/30/17  Yes [provider]  Naproxen Sodium (ALEVE PO) Take by mouth. As needed   Yes [provider]  NON FORMULARY    Yes [provider]  sertraline (ZOLOFT) 100 MG tablet Take 100 mg by mouth daily.   Yes [provider]  simvastatin (ZOCOR) 40 MG tablet take 1 tablet by mouth once daily 08/05/17  Yes Panosh, Neta Mends, MD  [facility] injection inject 0.5 milliliter intramuscularly 03/11/17   [provider]    Family History Family History  Problem Relation Age of Onset  . Diabetes Unknown   . Other Unknown        CVA age 4  . Other Unknown        sleep problems  . Myasthenia gravis Brother 32       deceased  . Hyperlipidemia Unknown   . Alcohol abuse Unknown        Uncle  . Other  Mother   . Stroke Father   . Diabetes Father   . Throat cancer Sister   . Breast cancer Sister   . Healthy Son   . Neuropathy Neg Hx     Social History Social History   Tobacco Use  . Smoking status: Never Smoker  . Smokeless tobacco: Never Used  Substance Use Topics  . Alcohol use: No    Alcohol/week: 0.0 standard drinks    Comment: Hx of alcohol abuse - none since 2010  . Drug use: No     Allergies   Patient has no known allergies.   Review of Systems Review of Systems   Physical Exam Triage Vital Signs ED Triage Vitals  Enc Vitals Group     BP 07/18/18 1101 (!) 136/92     Pulse Rate 07/18/18 1101 67     Resp 07/18/18 1101 18     Temp 07/18/18 1101 98.1 F (36.7 C)      Temp Source 07/18/18 1101 Oral     SpO2 07/18/18 1101 99 %     Weight --      Height --      Head Circumference --      Peak Flow --      Pain Score 07/18/18 1056 1     Pain Loc --      Pain Edu? --      Excl. in GC? --    No data found.  Updated Vital Signs BP (!) 136/92 (BP Location: Right Arm)   Pulse 67   Temp 98.1 F (36.7 C) (Oral)   Resp 18   SpO2 99%    Physical Exam  Constitutional: He is oriented to person, place, and time. He appears well-developed and well-nourished.  Cardiovascular: Normal rate and regular rhythm.  Pulmonary/Chest: Effort normal and breath sounds normal.  Musculoskeletal:       Left hand: He exhibits laceration. He exhibits normal range of motion, no tenderness, no bony tenderness, normal two-point discrimination, normal capillary refill, no deformity and no swelling. Normal sensation noted. Normal strength noted.  Very tip of left hand pinky finger with avulsion, soft tissue sliced off with knife; slight oozing of blood actively; approximately 1 mm deep only; no remaining skin flap; no nail involvement. No bony tenderness or proximity to tuft; sensation intact; full ROM of DIP joint and PIP joint; cap refill < 2 seconds    Neurological: He is alert and oriented to person, place, and time.  Skin: Skin is warm and dry.     UC Treatments / Results  Labs (all labs ordered are listed, but only abnormal results are displayed) Labs Reviewed - No data to display  EKG None  Radiology No results found.  Procedures Wound Care Date/Time: 07/18/2018 11:50 AM Performed by: Georgetta Haber, NP Authorized by: Georgetta Haber, NP   Consent:    Consent obtained:  Verbal   Consent given by:  Patient   Risks discussed:  Bleeding, infection, pain and poor cosmetic result   Alternatives discussed:  Observation and alternative treatment Anesthesia (see MAR for exact dosages):    Anesthesia method:  None Procedure details:    Indications: open wounds      Wound exploration location: extremity     Wound exploration location comment:  Left hand pinky finger    Wound age (days):  <1   Debridement level: skin, partial thickness   Skin layer closed with:    Wound care performed: cleansed soap and  water; surgicel placed for bleeding- controlled  Dressing:    Dressing applied:  Telfa pad   Wrapped with:  Bulky dressing and Coban 1 inch Post-procedure details:    Patient tolerance of procedure:  Tolerated well, no immediate complications   (including critical care time)  Medications Ordered in UC Medications - No data to display  Initial Impression / Assessment and Plan / UC Course  I have reviewed the triage vital signs and the nursing notes.  Pertinent labs & imaging results that were available during my care of the patient were reviewed by me and considered in my medical decision making (see chart for details).     Surgicel placed to tip of finger avulsion and bleeding controlled. No neurological findings, no bony involvement. Wound care discussed. Patient verbalized understanding and agreeable to plan.   Final Clinical Impressions(s) / UC Diagnoses   Final diagnoses:  Avulsion of finger, initial encounter     Discharge Instructions     Keep this dressing in place for the next 24 hours to allow some healing and prevent further bleeding.  May removed dressing tomorrow.  May have some slight oozing of blood.  If this recurs hold pressure and elevate hand for approximately 10 minutes which should help.  That that point wash daily with soap and water. Keep covered to keep clean.  I would avoid the pool for the next few days to allow some healing to occur. Then after in pool ensure wash thoroughly with soap and water.  If develop increased pain, redness, pus or thick drainage or otherwise concerned about infection please return to be seen.     ED Prescriptions    None     Controlled Substance Prescriptions [place] Controlled  Substance Registry consulted? Not Applicable   Georgetta Haber, NP 07/18/18 1154

## 2018-07-18 NOTE — ED Triage Notes (Addendum)
Patient cut finger this morning while slicing bread.  Injury to left little finger.  Has sprayed a product on it to stop the bleeding, but hasnt worked.  Patient washed finger in treatment room  Avulsion of tip of left little finger

## 2018-07-22 DIAGNOSIS — F332 Major depressive disorder, recurrent severe without psychotic features: Secondary | ICD-10-CM | POA: Diagnosis not present

## 2018-07-28 ENCOUNTER — Ambulatory Visit (INDEPENDENT_AMBULATORY_CARE_PROVIDER_SITE_OTHER): Payer: Federal, State, Local not specified - PPO | Admitting: *Deleted

## 2018-07-28 DIAGNOSIS — E538 Deficiency of other specified B group vitamins: Secondary | ICD-10-CM | POA: Diagnosis not present

## 2018-07-28 MED ORDER — CYANOCOBALAMIN 1000 MCG/ML IJ SOLN
1000.0000 ug | Freq: Once | INTRAMUSCULAR | Status: AC
  Administered 2018-07-28: 1000 ug via INTRAMUSCULAR

## 2018-08-03 DIAGNOSIS — Z86010 Personal history of colonic polyps: Secondary | ICD-10-CM | POA: Diagnosis not present

## 2018-08-03 DIAGNOSIS — K648 Other hemorrhoids: Secondary | ICD-10-CM | POA: Diagnosis not present

## 2018-08-03 DIAGNOSIS — Z1211 Encounter for screening for malignant neoplasm of colon: Secondary | ICD-10-CM | POA: Diagnosis not present

## 2018-08-03 LAB — HM COLONOSCOPY

## 2018-08-10 DIAGNOSIS — N401 Enlarged prostate with lower urinary tract symptoms: Secondary | ICD-10-CM | POA: Diagnosis not present

## 2018-08-10 DIAGNOSIS — R351 Nocturia: Secondary | ICD-10-CM | POA: Diagnosis not present

## 2018-08-26 ENCOUNTER — Ambulatory Visit (INDEPENDENT_AMBULATORY_CARE_PROVIDER_SITE_OTHER): Payer: Federal, State, Local not specified - PPO

## 2018-08-26 DIAGNOSIS — E538 Deficiency of other specified B group vitamins: Secondary | ICD-10-CM

## 2018-08-26 MED ORDER — CYANOCOBALAMIN 1000 MCG/ML IJ SOLN
1000.0000 ug | Freq: Once | INTRAMUSCULAR | Status: AC
  Administered 2018-08-26: 1000 ug via INTRAMUSCULAR

## 2018-08-27 DIAGNOSIS — K080 Exfoliation of teeth due to systemic causes: Secondary | ICD-10-CM | POA: Diagnosis not present

## 2018-08-29 ENCOUNTER — Other Ambulatory Visit: Payer: Self-pay | Admitting: Internal Medicine

## 2018-09-07 ENCOUNTER — Encounter: Payer: Self-pay | Admitting: Internal Medicine

## 2018-09-29 ENCOUNTER — Ambulatory Visit (INDEPENDENT_AMBULATORY_CARE_PROVIDER_SITE_OTHER): Payer: Federal, State, Local not specified - PPO | Admitting: *Deleted

## 2018-09-29 DIAGNOSIS — E538 Deficiency of other specified B group vitamins: Secondary | ICD-10-CM | POA: Diagnosis not present

## 2018-09-29 MED ORDER — CYANOCOBALAMIN 1000 MCG/ML IJ SOLN
1000.0000 ug | Freq: Once | INTRAMUSCULAR | Status: AC
  Administered 2018-09-29: 1000 ug via INTRAMUSCULAR

## 2018-10-15 DIAGNOSIS — F332 Major depressive disorder, recurrent severe without psychotic features: Secondary | ICD-10-CM | POA: Diagnosis not present

## 2018-10-30 ENCOUNTER — Ambulatory Visit (INDEPENDENT_AMBULATORY_CARE_PROVIDER_SITE_OTHER): Payer: Federal, State, Local not specified - PPO | Admitting: *Deleted

## 2018-10-30 DIAGNOSIS — E538 Deficiency of other specified B group vitamins: Secondary | ICD-10-CM

## 2018-10-30 MED ORDER — CYANOCOBALAMIN 1000 MCG/ML IJ SOLN
1000.0000 ug | Freq: Once | INTRAMUSCULAR | Status: AC
  Administered 2018-10-30: 1000 ug via INTRAMUSCULAR

## 2018-11-04 DIAGNOSIS — K080 Exfoliation of teeth due to systemic causes: Secondary | ICD-10-CM | POA: Diagnosis not present

## 2018-11-30 ENCOUNTER — Other Ambulatory Visit: Payer: Self-pay

## 2018-11-30 ENCOUNTER — Ambulatory Visit (INDEPENDENT_AMBULATORY_CARE_PROVIDER_SITE_OTHER): Payer: Federal, State, Local not specified - PPO | Admitting: *Deleted

## 2018-11-30 DIAGNOSIS — E538 Deficiency of other specified B group vitamins: Secondary | ICD-10-CM

## 2018-11-30 MED ORDER — CYANOCOBALAMIN 1000 MCG/ML IJ SOLN
1000.0000 ug | Freq: Once | INTRAMUSCULAR | Status: AC
  Administered 2018-11-30: 1000 ug via INTRAMUSCULAR

## 2018-12-07 ENCOUNTER — Telehealth: Payer: Self-pay

## 2018-12-07 DIAGNOSIS — R351 Nocturia: Secondary | ICD-10-CM | POA: Diagnosis not present

## 2018-12-07 DIAGNOSIS — N401 Enlarged prostate with lower urinary tract symptoms: Secondary | ICD-10-CM | POA: Diagnosis not present

## 2018-12-07 NOTE — Telephone Encounter (Signed)
LVm for pt call back to reschedule due to coivd-19 to prevent contact and spread. And to message in about 2 weeks to check in on this. Crm created okay for nbrse triage to disclose

## 2018-12-08 ENCOUNTER — Ambulatory Visit: Payer: Federal, State, Local not specified - PPO | Admitting: Internal Medicine

## 2019-01-11 DIAGNOSIS — F332 Major depressive disorder, recurrent severe without psychotic features: Secondary | ICD-10-CM | POA: Diagnosis not present

## 2019-02-12 DIAGNOSIS — H9190 Unspecified hearing loss, unspecified ear: Secondary | ICD-10-CM | POA: Diagnosis not present

## 2019-02-23 DIAGNOSIS — K080 Exfoliation of teeth due to systemic causes: Secondary | ICD-10-CM | POA: Diagnosis not present

## 2019-04-08 DIAGNOSIS — F332 Major depressive disorder, recurrent severe without psychotic features: Secondary | ICD-10-CM | POA: Diagnosis not present

## 2019-05-27 ENCOUNTER — Encounter: Payer: Federal, State, Local not specified - PPO | Admitting: Internal Medicine

## 2019-06-09 ENCOUNTER — Other Ambulatory Visit: Payer: Self-pay | Admitting: Internal Medicine

## 2019-06-21 NOTE — Progress Notes (Signed)
Chief Complaint  Patient presents with  . Annual Exam    Pt is concerned aboout weight    HPI: Patient  Randall Torres  68 y.o. comes in today for Preventive Health Care visit  And med check   b12 low taking  dissolvable .   Foot drop better       Seeing dr Jill Side.  In past for bph some better no new sx ? bph Urethral  Procedure .    Better .  Has gained weight prob from snacking and meds Lipids taking med no se  Noted   Health Maintenance  Topic Date Due  . TETANUS/TDAP  12/05/2024  . COLONOSCOPY  08/03/2028  . INFLUENZA VACCINE  Completed  . Hepatitis C Screening  Completed  . PNA vac Low Risk Adult  Completed   Health Maintenance Review LIFESTYLE:  Exercise:   Walks daily 1-2 miles  And some swimming.   Tobacco/ETS:no Alcohol:  no Sugar beverages: Sleep:  8  Drug use: no HH of   2 Work: [employer]        ROS:  GEN/ HEENT: No fever, significant weight changes sweats headaches vision problems hearing changes, CV/ PULM; No chest pain shortness of breath cough, syncope,edema  change in exercise tolerance. GI /GU: No adominal pain, vomiting, change in bowel habits. No blood in the stool. No significant GU symptoms. SKIN/HEME: ,no acute skin rashes suspicious lesions or bleeding. No lymphadenopathy, nodules, masses.  NEURO/ PSYCH:  No neurologic signs such as weakness numbness. No depression anxiety. IMM/ Allergy: No unusual infections.  Allergy .   REST of 12 system review negative except as per HPI   Past Medical History:  Diagnosis Date  . ADD (attention deficit disorder)   . ADJUSTMENT DISORDER 09/29/2007   Qualifier: Diagnosis of  By: Regis Bill MD, Standley Brooking   . BPH (benign prostatic hyperplasia)    has seen urologist  . Hyperlipidemia   . Inguinal hernia    bilateral  . Personal history of colonic adenoma 06/30/2008  . Rhinitis 12/31/2010    Past Surgical History:  Procedure Laterality Date  . CYSTOSCOPY    . INGUINAL HERNIA REPAIR Bilateral 10/18/2014   Procedure: LAPAROSCOPIC BILATERAL INGUINAL HERNIA REPAIR  ;  Surgeon: Fanny Skates, MD;  Location: [facility];  Service: General;  Laterality: Bilateral;  . INSERTION OF MESH Bilateral 10/18/2014   Procedure: INSERTION OF MESH;  Surgeon: Fanny Skates, MD;  Location: [facility];  Service: General;  Laterality: Bilateral;  . KNEE ARTHROSCOPY    . TOTAL KNEE ARTHROPLASTY  11/08   medial compartmental     Family History  Problem Relation Age of Onset  . Diabetes Unknown   . Other Unknown        CVA age 39  . Other Unknown        sleep problems  . Myasthenia gravis Brother 66       deceased  . Hyperlipidemia Unknown   . Alcohol abuse Unknown        Uncle  . Other Mother   . Stroke Father   . Diabetes Father   . Throat cancer Sister   . Breast cancer Sister   . Healthy Son   . Neuropathy Neg Hx     Social History   Socioeconomic History  . Marital status: Married    Spouse name: Not on file  . Number of children: Not on file  . Years of education: Not on file  . Highest  education level: Not on file  Occupational History  . Not on file  Social Needs  . Financial resource strain: Not on file  . Food insecurity    Worry: Not on file    Inability: Not on file  . Transportation needs    Medical: Not on file    Non-medical: Not on file  Tobacco Use  . Smoking status: Never Smoker  . Smokeless tobacco: Never Used  Substance and Sexual Activity  . Alcohol use: No    Alcohol/week: 0.0 standard drinks    Comment: Hx of alcohol abuse - none since 2010  . Drug use: No  . Sexual activity: Not on file  Lifestyle  . Physical activity    Days per week: Not on file    Minutes per session: Not on file  . Stress: Not on file  Relationships  . Social Musician on phone: Not on file    Gets together: Not on file    Attends religious service: Not on file    Active member of club or organization: Not on file    Attends meetings of clubs or organizations: Not on file     Relationship status: Not on file  Other Topics Concern  . Not on file  Social History Narrative   Occupation: Publishing rights manager.   Highest level of education: law school   Married   Exercise: Swims daily  5-6 days per week.    Goes to AA  5 day per week or so.      HH of 2 dog     Wife recent dx and rx for breast cancer    2 children.   Lives with wife in a 2 story home.     Outpatient Medications Prior to Visit  Medication Sig Dispense Refill  . clonazePAM (KLONOPIN) 0.5 MG tablet   0  . Naproxen Sodium (ALEVE PO) Take by mouth. As needed    . NON FORMULARY     . sertraline (ZOLOFT) 100 MG tablet Take 100 mg by mouth daily.    . simvastatin (ZOCOR) 40 MG tablet TAKE 1 TABLET BY MOUTH EVERY DAY 90 tablet 0  . SHINGRIX injection inject 0.5 milliliter intramuscularly  0   Facility-Administered Medications Prior to Visit  Medication Dose Route Frequency Provider Last Rate Last Dose  . cyanocobalamin ((VITAMIN B-12)) injection 1,000 mcg  1,000 mcg Intramuscular Once Patel, Donika K, DO         EXAM:  BP 118/68 (BP Location: Right Arm, Patient Position: Sitting, Cuff Size: Normal)   Pulse (!) 56   Temp 97.7 F (36.5 C) (Temporal)   Ht 5\' 9"  (1.753 m)   Wt 193 lb 9.6 oz (87.8 kg)   SpO2 99%   BMI 28.59 kg/m   Body mass index is 28.59 kg/m. Wt Readings from Last 3 Encounters:  06/22/19 193 lb 9.6 oz (87.8 kg)  05/25/18 194 lb 8 oz (88.2 kg)  09/03/17 190 lb 8 oz (86.4 kg)    Physical Exam: Vital signs reviewed 09/05/17 is a well-developed well-nourished alert cooperative    who appearsr stated age in no acute distress.  HEENT: normocephalic atraumatic , Eyes: PERRL EOM's full, conjunctiva clear, , Ears: no deformity EAC's some wax in right hearing aids   TMs with normal landmarks. Mouth:maksed NECK: supple without masses, thyromegaly or bruits. CHEST/PULM:  Clear to auscultation and percussion breath sounds equal no wheeze , rales or rhonchi. No chest wall deformities  or tenderness. CV: PMI is nondisplaced, S1 S2 no gallops, murmurs, rubs. Peripheral pulses are full without delay.No JVD .  ABDOMEN: Bowel sounds normal nontender  No guard or rebound, no hepato splenomegal no CVA tenderness.  No hernia. Extremtities:  No clubbing cyanosis or edema, no acute joint swelling or redness no focal atrophy left kne scar  NEURO:  Oriented x3, cranial nerves 3-12 appear to be intact, no obvious focal weakness,gait within normal limits no abnormal reflexes or asymmetrical SKIN: No acute rashes normal turgor, color, no bruising or petechiae. PSYCH: Oriented, good eye contact, no obvious depression anxiety, cognition and judgment appear normal. LN: no cervical axillary inguinal adenopathy Rectal: no masses ;   Prostate 1-2 no nodule .   Lab Results  Component Value Date   WBC 5.7 06/22/2019   HGB 13.9 06/22/2019   HCT 41.9 06/22/2019   PLT 301.0 06/22/2019   GLUCOSE 100 (H) 06/22/2019   CHOL 180 06/22/2019   TRIG 66.0 06/22/2019   HDL 48.80 06/22/2019   LDLDIRECT 147.2 11/29/2009   LDLCALC 118 (H) 06/22/2019   ALT 19 06/22/2019   AST 26 06/22/2019   NA 140 06/22/2019   K 5.2 (H) 06/22/2019   CL 102 06/22/2019   CREATININE 0.78 06/22/2019   BUN 21 06/22/2019   CO2 31 06/22/2019   TSH 2.21 06/22/2019   PSA 0.86 06/22/2019   HGBA1C 6.0 06/22/2019    BP Readings from Last 3 Encounters:  06/22/19 118/68  07/18/18 (!) 136/92  05/25/18 138/88    Lab plan  reviewed with patient   ASSESSMENT AND PLAN:  Discussed the following assessment and plan:    ICD-10-CM   1. Visit for preventive health examination  Z00.00 Basic metabolic panel    CBC with Differential/Platelet    Hepatic function panel    Lipid panel    TSH    T4, free    Hemoglobin A1c    Vitamin B12  2. Medication management  Z79.899 Basic metabolic panel    CBC with Differential/Platelet    Hepatic function panel    Lipid panel    TSH    T4, free    Hemoglobin A1c    Vitamin B12   3. Hyperlipidemia, unspecified hyperlipidemia type  E78.5 Basic metabolic panel    CBC with Differential/Platelet    Hepatic function panel    Lipid panel    TSH    T4, free    Hemoglobin A1c    Vitamin B12  4. Decreased hearing, unspecified laterality  H91.90   5. B12 deficiency  E53.8 CBC with Differential/Platelet    Vitamin B12  6. Screening PSA (prostate specific antigen)  Z12.5 PSA   Patient Care Team: Panosh, Neta Mends, MD as PCP - General Rajakumar Thotakura (Psychiatry) [place] Chard, DO as Consulting Physician (Neurology) Patient Instructions  Your exam is normal  Will notify you  of labs when available.   Avoiding further weight gain   snacking lat  Night eating etc.   Avoiding processed carbohydrates.    See you in a year if all ok .   Health Maintenance, Male Adopting a healthy lifestyle and getting preventive care are important in promoting health and wellness. Ask your health care provider about:  The right schedule for you to have regular tests and exams.  Things you can do on your own to prevent diseases and keep yourself healthy. What should I know about diet, weight, and exercise? Eat a healthy diet   Eat  a diet that includes plenty of vegetables, fruits, low-fat dairy products, and lean protein.  Do not eat a lot of foods that are high in solid fats, added sugars, or sodium. Maintain a healthy weight Body mass index (BMI) is a measurement that can be used to identify possible weight problems. It estimates body fat based on height and weight. Your health care provider can help determine your BMI and help you achieve or maintain a healthy weight. Get regular exercise Get regular exercise. This is one of the most important things you can do for your health. Most adults should:  Exercise for at least 150 minutes each week. The exercise should increase your heart rate and make you sweat (moderate-intensity exercise).  Do strengthening exercises at least  twice a week. This is in addition to the moderate-intensity exercise.  Spend less time sitting. Even light physical activity can be beneficial. Watch cholesterol and blood lipids Have your blood tested for lipids and cholesterol at 70 years of age, then have this test every 5 years. You may need to have your cholesterol levels checked more often if:  Your lipid or cholesterol levels are high.  You are older than 70 years of age.  You are at high risk for heart disease. What should I know about cancer screening? Many types of cancers can be detected early and may often be prevented. Depending on your health history and family history, you may need to have cancer screening at various ages. This may include screening for:  Colorectal cancer.  Prostate cancer.  Skin cancer.  Lung cancer. What should I know about heart disease, diabetes, and high blood pressure? Blood pressure and heart disease  High blood pressure causes heart disease and increases the risk of stroke. This is more likely to develop in people who have high blood pressure readings, are of African descent, or are overweight.  Talk with your health care provider about your target blood pressure readings.  Have your blood pressure checked: ? Every 3-5 years if you are 57-26 years of age. ? Every year if you are 65 years old or older.  If you are between the ages of 43 and 5 and are a current or former smoker, ask your health care provider if you should have a one-time screening for abdominal aortic aneurysm (AAA). Diabetes Have regular diabetes screenings. This checks your fasting blood sugar level. Have the screening done:  Once every three years after age 20 if you are at a normal weight and have a low risk for diabetes.  More often and at a younger age if you are overweight or have a high risk for diabetes. What should I know about preventing infection? Hepatitis B If you have a higher risk for hepatitis B, you  should be screened for this virus. Talk with your health care provider to find out if you are at risk for hepatitis B infection. Hepatitis C Blood testing is recommended for:  Everyone born from [birth year] through 1967.  Anyone with known risk factors for hepatitis C. Sexually transmitted infections (STIs)  You should be screened each year for STIs, including gonorrhea and chlamydia, if: ? You are sexually active and are younger than 70 years of age. ? You are older than 70 years of age and your health care provider tells you that you are at risk for this type of infection. ? Your sexual activity has changed since you were last screened, and you are at increased risk for chlamydia or  gonorrhea. Ask your health care provider if you are at risk.  Ask your health care provider about whether you are at high risk for HIV. Your health care provider may recommend a prescription medicine to help prevent HIV infection. If you choose to take medicine to prevent HIV, you should first get tested for HIV. You should then be tested every 3 months for as long as you are taking the medicine. Follow these instructions at home: Lifestyle  Do not use any products that contain nicotine or tobacco, such as cigarettes, e-cigarettes, and chewing tobacco. If you need help quitting, ask your health care provider.  Do not use street drugs.  Do not share needles.  Ask your health care provider for help if you need support or information about quitting drugs. Alcohol use  Do not drink alcohol if your health care provider tells you not to drink.  If you drink alcohol: ? Limit how much you have to 0-2 drinks a day. ? Be aware of how much alcohol is in your drink. In the [place] one drink equals one 12 oz bottle of beer (355 mL), one 5 oz glass of wine (148 mL), or one 1 oz glass of hard liquor (44 mL). General instructions  Schedule regular health, dental, and eye exams.  Stay current with your vaccines.  Tell your  health care provider if: ? You often feel depressed. ? You have ever been abused or do not feel safe at home. Summary  Adopting a healthy lifestyle and getting preventive care are important in promoting health and wellness.  Follow your health care provider's instructions about healthy diet, exercising, and getting tested or screened for diseases.  Follow your health care provider's instructions on monitoring your cholesterol and blood pressure. This information is not intended to replace advice given to you by your health care provider. Make sure you discuss any questions you have with your health care provider. Document Released: 02/29/2008 Document Revised: 08/26/2018 Document Reviewed: 08/26/2018 Elsevier Patient Education  2020 [employer]    EllenvilleWanda K. Panosh M.D.

## 2019-06-22 ENCOUNTER — Encounter: Payer: Self-pay | Admitting: Internal Medicine

## 2019-06-22 ENCOUNTER — Other Ambulatory Visit: Payer: Self-pay

## 2019-06-22 ENCOUNTER — Ambulatory Visit (INDEPENDENT_AMBULATORY_CARE_PROVIDER_SITE_OTHER): Payer: Federal, State, Local not specified - PPO | Admitting: Internal Medicine

## 2019-06-22 VITALS — BP 118/68 | HR 56 | Temp 97.7°F | Ht 69.0 in | Wt 193.6 lb

## 2019-06-22 DIAGNOSIS — Z79899 Other long term (current) drug therapy: Secondary | ICD-10-CM

## 2019-06-22 DIAGNOSIS — E538 Deficiency of other specified B group vitamins: Secondary | ICD-10-CM | POA: Diagnosis not present

## 2019-06-22 DIAGNOSIS — E785 Hyperlipidemia, unspecified: Secondary | ICD-10-CM

## 2019-06-22 DIAGNOSIS — Z125 Encounter for screening for malignant neoplasm of prostate: Secondary | ICD-10-CM | POA: Diagnosis not present

## 2019-06-22 DIAGNOSIS — H9190 Unspecified hearing loss, unspecified ear: Secondary | ICD-10-CM | POA: Diagnosis not present

## 2019-06-22 DIAGNOSIS — Z0000 Encounter for general adult medical examination without abnormal findings: Secondary | ICD-10-CM

## 2019-06-22 LAB — HEPATIC FUNCTION PANEL
ALT: 19 U/L (ref 0–53)
AST: 26 U/L (ref 0–37)
Albumin: 4.4 g/dL (ref 3.5–5.2)
Alkaline Phosphatase: 66 U/L (ref 39–117)
Bilirubin, Direct: 0.1 mg/dL (ref 0.0–0.3)
Total Bilirubin: 0.6 mg/dL (ref 0.2–1.2)
Total Protein: 7.7 g/dL (ref 6.0–8.3)

## 2019-06-22 LAB — PSA: PSA: 0.86 ng/mL (ref 0.10–4.00)

## 2019-06-22 LAB — CBC WITH DIFFERENTIAL/PLATELET
Basophils Absolute: 0.1 10*3/uL (ref 0.0–0.1)
Basophils Relative: 1.3 % (ref 0.0–3.0)
Eosinophils Absolute: 0.3 10*3/uL (ref 0.0–0.7)
Eosinophils Relative: 4.5 % (ref 0.0–5.0)
HCT: 41.9 % (ref 39.0–52.0)
Hemoglobin: 13.9 g/dL (ref 13.0–17.0)
Lymphocytes Relative: 36.8 % (ref 12.0–46.0)
Lymphs Abs: 2.1 10*3/uL (ref 0.7–4.0)
MCHC: 33.2 g/dL (ref 30.0–36.0)
MCV: 90.3 fl (ref 78.0–100.0)
Monocytes Absolute: 0.4 10*3/uL (ref 0.1–1.0)
Monocytes Relative: 7.4 % (ref 3.0–12.0)
Neutro Abs: 2.9 10*3/uL (ref 1.4–7.7)
Neutrophils Relative %: 50 % (ref 43.0–77.0)
Platelets: 301 10*3/uL (ref 150.0–400.0)
RBC: 4.65 Mil/uL (ref 4.22–5.81)
RDW: 13 % (ref 11.5–15.5)
WBC: 5.7 10*3/uL (ref 4.0–10.5)

## 2019-06-22 LAB — T4, FREE: Free T4: 0.8 ng/dL (ref 0.60–1.60)

## 2019-06-22 LAB — TSH: TSH: 2.21 u[IU]/mL (ref 0.35–4.50)

## 2019-06-22 LAB — LIPID PANEL
Cholesterol: 180 mg/dL (ref 0–200)
HDL: 48.8 mg/dL (ref >39.00)
LDL Cholesterol: 118 mg/dL — ABNORMAL HIGH (ref 0–99)
NonHDL: 130.94
Total CHOL/HDL Ratio: 4
Triglycerides: 66 mg/dL (ref 0.0–149.0)
VLDL: 13.2 mg/dL (ref 0.0–40.0)

## 2019-06-22 LAB — BASIC METABOLIC PANEL
BUN: 21 mg/dL (ref 6–23)
CO2: 31 mEq/L (ref 19–32)
Calcium: 9.9 mg/dL (ref 8.4–10.5)
Chloride: 102 mEq/L (ref 96–112)
Creatinine, Ser: 0.78 mg/dL (ref 0.40–1.50)
GFR: 98.43 mL/min (ref >60.00)
Glucose, Bld: 100 mg/dL — ABNORMAL HIGH (ref 70–99)
Potassium: 5.2 mEq/L — ABNORMAL HIGH (ref 3.5–5.1)
Sodium: 140 mEq/L (ref 135–145)

## 2019-06-22 LAB — HEMOGLOBIN A1C: Hgb A1c MFr Bld: 6 % (ref 4.6–6.5)

## 2019-06-22 LAB — VITAMIN B12: Vitamin B-12: 1401 pg/mL — ABNORMAL HIGH (ref 211–911)

## 2019-06-22 NOTE — Patient Instructions (Addendum)
Your exam is normal  Will notify you  of labs when available.   Avoiding further weight gain   snacking lat  Night eating etc.   Avoiding processed carbohydrates.    See you in a year if all ok .   Health Maintenance, Male Adopting a healthy lifestyle and getting preventive care are important in promoting health and wellness. Ask your health care provider about:  The right schedule for you to have regular tests and exams.  Things you can do on your own to prevent diseases and keep yourself healthy. What should I know about diet, weight, and exercise? Eat a healthy diet   Eat a diet that includes plenty of vegetables, fruits, low-fat dairy products, and lean protein.  Do not eat a lot of foods that are high in solid fats, added sugars, or sodium. Maintain a healthy weight Body mass index (BMI) is a measurement that can be used to identify possible weight problems. It estimates body fat based on height and weight. Your health care provider can help determine your BMI and help you achieve or maintain a healthy weight. Get regular exercise Get regular exercise. This is one of the most important things you can do for your health. Most adults should:  Exercise for at least 150 minutes each week. The exercise should increase your heart rate and make you sweat (moderate-intensity exercise).  Do strengthening exercises at least twice a week. This is in addition to the moderate-intensity exercise.  Spend less time sitting. Even light physical activity can be beneficial. Watch cholesterol and blood lipids Have your blood tested for lipids and cholesterol at 70 years of age, then have this test every 5 years. You may need to have your cholesterol levels checked more often if:  Your lipid or cholesterol levels are high.  You are older than 70 years of age.  You are at high risk for heart disease. What should I know about cancer screening? Many types of cancers can be detected early and  may often be prevented. Depending on your health history and family history, you may need to have cancer screening at various ages. This may include screening for:  Colorectal cancer.  Prostate cancer.  Skin cancer.  Lung cancer. What should I know about heart disease, diabetes, and high blood pressure? Blood pressure and heart disease  High blood pressure causes heart disease and increases the risk of stroke. This is more likely to develop in people who have high blood pressure readings, are of African descent, or are overweight.  Talk with your health care provider about your target blood pressure readings.  Have your blood pressure checked: ? Every 3-5 years if you are 76-36 years of age. ? Every year if you are 65 years old or older.  If you are between the ages of 53 and 51 and are a current or former smoker, ask your health care provider if you should have a one-time screening for abdominal aortic aneurysm (AAA). Diabetes Have regular diabetes screenings. This checks your fasting blood sugar level. Have the screening done:  Once every three years after age 28 if you are at a normal weight and have a low risk for diabetes.  More often and at a younger age if you are overweight or have a high risk for diabetes. What should I know about preventing infection? Hepatitis B If you have a higher risk for hepatitis B, you should be screened for this virus. Talk with your health care provider  to find out if you are at risk for hepatitis B infection. Hepatitis C Blood testing is recommended for:  Everyone born from [birth year] through 1967.  Anyone with known risk factors for hepatitis C. Sexually transmitted infections (STIs)  You should be screened each year for STIs, including gonorrhea and chlamydia, if: ? You are sexually active and are younger than 70 years of age. ? You are older than 70 years of age and your health care provider tells you that you are at risk for this type of  infection. ? Your sexual activity has changed since you were last screened, and you are at increased risk for chlamydia or gonorrhea. Ask your health care provider if you are at risk.  Ask your health care provider about whether you are at high risk for HIV. Your health care provider may recommend a prescription medicine to help prevent HIV infection. If you choose to take medicine to prevent HIV, you should first get tested for HIV. You should then be tested every 3 months for as long as you are taking the medicine. Follow these instructions at home: Lifestyle  Do not use any products that contain nicotine or tobacco, such as cigarettes, e-cigarettes, and chewing tobacco. If you need help quitting, ask your health care provider.  Do not use street drugs.  Do not share needles.  Ask your health care provider for help if you need support or information about quitting drugs. Alcohol use  Do not drink alcohol if your health care provider tells you not to drink.  If you drink alcohol: ? Limit how much you have to 0-2 drinks a day. ? Be aware of how much alcohol is in your drink. In the [place] one drink equals one 12 oz bottle of beer (355 mL), one 5 oz glass of wine (148 mL), or one 1 oz glass of hard liquor (44 mL). General instructions  Schedule regular health, dental, and eye exams.  Stay current with your vaccines.  Tell your health care provider if: ? You often feel depressed. ? You have ever been abused or do not feel safe at home. Summary  Adopting a healthy lifestyle and getting preventive care are important in promoting health and wellness.  Follow your health care provider's instructions about healthy diet, exercising, and getting tested or screened for diseases.  Follow your health care provider's instructions on monitoring your cholesterol and blood pressure. This information is not intended to replace advice given to you by your health care provider. Make sure you  discuss any questions you have with your health care provider. Document Released: 02/29/2008 Document Revised: 08/26/2018 Document Reviewed: 08/26/2018 Elsevier Patient Education  2020 [employer].

## 2019-07-06 DIAGNOSIS — F332 Major depressive disorder, recurrent severe without psychotic features: Secondary | ICD-10-CM | POA: Diagnosis not present

## 2019-09-09 ENCOUNTER — Other Ambulatory Visit: Payer: Self-pay | Admitting: Internal Medicine

## 2019-09-29 DIAGNOSIS — F332 Major depressive disorder, recurrent severe without psychotic features: Secondary | ICD-10-CM | POA: Diagnosis not present

## 2019-10-05 ENCOUNTER — Ambulatory Visit: Payer: Federal, State, Local not specified - PPO | Attending: Internal Medicine

## 2019-10-05 DIAGNOSIS — Z23 Encounter for immunization: Secondary | ICD-10-CM | POA: Diagnosis not present

## 2019-10-05 NOTE — Progress Notes (Signed)
   Covid-19 Vaccination Clinic  Name:  Randall Torres    MRN: [id number] DOB: 06-25-1951  10/05/2019  Mr. Randall Torres was observed post Covid-19 immunization for 15 minutes without incidence. He was provided with Vaccine Information Sheet and instruction to access the V-Safe system.   Mr. Randall Torres was instructed to call 911 with any severe reactions post vaccine: Marland Kitchen Difficulty breathing  . Swelling of your face and throat  . A fast heartbeat  . A bad rash all over your body  . Dizziness and weakness    Immunizations Administered    Name Date Dose VIS Date Route   Pfizer COVID-19 Vaccine 10/05/2019  1:36 PM 0.3 mL 08/27/2019 Intramuscular   Manufacturer: [employer], [employer]   Lot: [id number]   NDC: [address]-9

## 2019-10-25 ENCOUNTER — Ambulatory Visit: Payer: Federal, State, Local not specified - PPO | Attending: Internal Medicine

## 2019-10-25 DIAGNOSIS — Z23 Encounter for immunization: Secondary | ICD-10-CM | POA: Insufficient documentation

## 2019-10-25 NOTE — Progress Notes (Signed)
   Covid-19 Vaccination Clinic  Name:  Randall Torres    MRN: [id number] DOB: [birth year]  10/25/2019  [name] was observed post Covid-19 immunization for 15 minutes without incidence. He was provided with Vaccine Information Sheet and instruction to access the V-Safe system.   [name] was instructed to call 911 with any severe reactions post vaccine: Marland Kitchen Difficulty breathing  . Swelling of your face and throat  . A fast heartbeat  . A bad rash all over your body  . Dizziness and weakness    Immunizations Administered    Name Date Dose VIS Date Route   Pfizer COVID-19 Vaccine 10/25/2019  2:50 PM 0.3 mL 08/27/2019 Intramuscular   Manufacturer: [employer], [employer]   Lot: [id number]   NDC: [address]-6

## 2019-10-26 ENCOUNTER — Ambulatory Visit: Payer: Federal, State, Local not specified - PPO

## 2019-11-22 ENCOUNTER — Other Ambulatory Visit: Payer: Self-pay | Admitting: Internal Medicine

## 2019-11-23 ENCOUNTER — Other Ambulatory Visit: Payer: Self-pay

## 2019-11-23 ENCOUNTER — Telehealth: Payer: Self-pay | Admitting: *Deleted

## 2019-11-23 ENCOUNTER — Encounter (HOSPITAL_COMMUNITY): Payer: Self-pay

## 2019-11-23 ENCOUNTER — Ambulatory Visit (HOSPITAL_COMMUNITY)
Admission: EM | Admit: 2019-11-23 | Discharge: 2019-11-23 | Disposition: A | Discharge status: Home / Self Care | Payer: Federal, State, Local not specified - PPO

## 2019-11-23 DIAGNOSIS — T161XXA Foreign body in right ear, initial encounter: Secondary | ICD-10-CM | POA: Diagnosis not present

## 2019-11-23 NOTE — Telephone Encounter (Signed)
Noted  

## 2019-11-23 NOTE — Telephone Encounter (Signed)
Patient called nurse triage line this morning and stated  that he has a hearing aid stuck in his ear. He is really embarrassed. He would like to be seen in office today. Patient went to [facility].

## 2019-11-23 NOTE — ED Triage Notes (Signed)
Pt states a piece of hearing aid broke off in right ear this morning.

## 2019-11-23 NOTE — Discharge Instructions (Signed)
So nice to meet you!! Foreign body removed from the ear

## 2019-11-24 DIAGNOSIS — T161XXA Foreign body in right ear, initial encounter: Secondary | ICD-10-CM

## 2019-11-24 NOTE — ED Provider Notes (Signed)
[facility]    CSN: [id number] Arrival date & time: 11/23/19  3748      History   Chief Complaint Chief Complaint  Patient presents with  . Foreign Body in Ear    HPI Randall Torres is a 69 y.o. male.   Patient is a 69 year old male presents today with foreign body in the right ear. Reporting he was trying to remove his hearing aid and a  piece stuck in the ear canal. Since he has had trouble hearing. Denies any pain. Denies any drainage from the ear.      Past Medical History:  Diagnosis Date  . ADD (attention deficit disorder)   . ADJUSTMENT DISORDER 09/29/2007   Qualifier: Diagnosis of  By: Fabian Sharp MD, Neta Mends   . BPH (benign prostatic hyperplasia)    has seen urologist  . Hyperlipidemia   . Inguinal hernia    bilateral  . Personal history of colonic adenoma 06/30/2008  . Rhinitis 12/31/2010    Patient Active Problem List   Diagnosis Date Noted  . Decreased hearing 05/25/2018  . B12 deficiency 09/03/2017  . Foraminal stenosis of lumbosacral region 05/16/2016  . Neuropathy 05/03/2016  . BPH (benign prostatic hyperplasia)   . Visit for preventive health examination 12/31/2010  . 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 Personal history of colonic adenoma 06/30/2008  . HYPERLIPIDEMIA 09/29/2007  . ERECTILE DYSFUNCTION 09/29/2007  . ADD 09/29/2007  . DEGENERATIVE JOINT DISEASE, LEFT KNEE 09/29/2007    Past Surgical History:  Procedure Laterality Date  . CYSTOSCOPY    . INGUINAL HERNIA REPAIR Bilateral 10/18/2014   Procedure: LAPAROSCOPIC BILATERAL INGUINAL HERNIA REPAIR  ;  Surgeon: Claud Kelp, MD;  Location: [facility];  Service: General;  Laterality: Bilateral;  . INSERTION OF MESH Bilateral 10/18/2014   Procedure: INSERTION OF MESH;  Surgeon: Claud Kelp, MD;  Location: [facility];  Service: General;  Laterality: Bilateral;  . KNEE ARTHROSCOPY    . TOTAL KNEE ARTHROPLASTY  11/08   medial compartmental        Home Medications     Prior to Admission medications   Medication Sig Start Date End Date Taking? Authorizing Provider  alfuzosin (UROXATRAL) 10 MG 24 hr tablet TK 1 T PO QD 06/26/18  Yes [provider]  clonazePAM (KLONOPIN) 0.5 MG tablet  04/30/17  Yes [provider]  sertraline (ZOLOFT) 100 MG tablet Take 100 mg by mouth daily.   Yes [provider]  simvastatin (ZOCOR) 40 MG tablet TAKE 1 TABLET BY MOUTH EVERY DAY 11/22/19  Yes Panosh, Neta Mends, MD  Naproxen Sodium (ALEVE PO) Take by mouth. As needed    [provider]  NON FORMULARY     [provider]    Family History Family History  Problem Relation Age of Onset  . Diabetes Other   . Other Other        CVA age 80  . Other Other        sleep problems  . Myasthenia gravis Brother 32       deceased  . Hyperlipidemia Other   . Alcohol abuse Other        Uncle  . Other Mother   . Stroke Father   . Diabetes Father   . Throat cancer Sister   . Breast cancer Sister   . Healthy Son   . Neuropathy Neg Hx     Social History Social History   Tobacco Use  . Smoking  status: Never Smoker  . Smokeless tobacco: Never Used  Substance Use Topics  . Alcohol use: No    Alcohol/week: 0.0 standard drinks    Comment: Hx of alcohol abuse - none since 2010  . Drug use: No     Allergies   Patient has no known allergies.   Review of Systems Review of Systems   Physical Exam Triage Vital Signs ED Triage Vitals  Enc Vitals Group     BP 11/23/19 0950 (!) 151/94     Pulse Rate 11/23/19 0950 70     Resp 11/23/19 0950 16     Temp 11/23/19 0950 98.4 F (36.9 C)     Temp Source 11/23/19 0950 Oral     SpO2 11/23/19 0950 97 %     Weight --      Height --      Head Circumference --      Peak Flow --      Pain Score 11/23/19 0948 0     Pain Loc --      Pain Edu? --      Excl. in [place]? --    No data found.  Updated Vital Signs BP (!) 151/94 (BP Location: Left Arm)   Pulse 70   Temp 98.4 F (36.9  C) (Oral)   Resp 16   SpO2 97%   Visual Acuity Right Eye Distance:   Left Eye Distance:   Bilateral Distance:    Right Eye Near:   Left Eye Near:    Bilateral Near:     Physical Exam Vitals and nursing note reviewed.  Constitutional:      Appearance: Normal appearance.  HENT:     Head: Normocephalic and atraumatic.     Ears:     Comments: Foreign body noted in ear canal     Nose: Nose normal.  Eyes:     Conjunctiva/sclera: Conjunctivae normal.  Pulmonary:     Effort: Pulmonary effort is normal.  Musculoskeletal:        General: Normal range of motion.     Cervical back: Normal range of motion.  Skin:    General: Skin is warm and dry.  Neurological:     Mental Status: He is alert.  Psychiatric:        Mood and Affect: Mood normal.      UC Treatments / Results  Labs (all labs ordered are listed, but only abnormal results are displayed) Labs Reviewed - No data to display  EKG   Radiology No results found.  Procedures Foreign Body Removal  Date/Time: 11/24/2019 1:25 PM Performed by: Orvan July, NP Authorized by: Orvan July, NP   Consent:    Consent obtained:  Verbal   Consent given by:  Patient   Risks discussed:  Bleeding and pain   Alternatives discussed:  No treatment Location:    Location:  Ear   Ear location:  R ear Anesthesia (see MAR for exact dosages):    Anesthesia method:  None Procedure type:    Procedure complexity:  Simple Procedure details:    Foreign bodies recovered:  1   Description:  Plastic covering    Intact foreign body removal: yes   Post-procedure details:    Patient tolerance of procedure:  Tolerated well, no immediate complications   (including critical care time)  Medications Ordered in UC Medications - No data to display  Initial Impression / Assessment and Plan / UC Course  I have reviewed the triage vital  signs and the nursing notes.  Pertinent labs & imaging results that were available during my care  of the patient were reviewed by me and considered in my medical decision making (see chart for details).     Foreign body in the right ear. Piece of what appears to be plastic from his hearing aid removed with tweezers. Patient tolerated well. No bleeding or pain.   Final Clinical Impressions(s) / UC Diagnoses   Final diagnoses:  Foreign body of right ear, initial encounter     Discharge Instructions     So nice to meet you!! Foreign body removed from the ear     ED Prescriptions    None     PDMP not reviewed this encounter.   Dahlia Byes A, NP 11/24/19 1326

## 2019-12-02 DIAGNOSIS — K080 Exfoliation of teeth due to systemic causes: Secondary | ICD-10-CM | POA: Diagnosis not present

## 2019-12-20 DIAGNOSIS — F332 Major depressive disorder, recurrent severe without psychotic features: Secondary | ICD-10-CM | POA: Diagnosis not present

## 2020-02-16 DIAGNOSIS — F332 Major depressive disorder, recurrent severe without psychotic features: Secondary | ICD-10-CM | POA: Diagnosis not present

## 2020-02-20 ENCOUNTER — Other Ambulatory Visit: Payer: Self-pay | Admitting: Internal Medicine

## 2020-03-06 DIAGNOSIS — F332 Major depressive disorder, recurrent severe without psychotic features: Secondary | ICD-10-CM | POA: Diagnosis not present

## 2020-03-15 DIAGNOSIS — F332 Major depressive disorder, recurrent severe without psychotic features: Secondary | ICD-10-CM | POA: Diagnosis not present

## 2020-03-27 ENCOUNTER — Telehealth: Payer: Self-pay | Admitting: Internal Medicine

## 2020-03-27 NOTE — Telephone Encounter (Signed)
Pt has questions about some vaccines that [facility] hospital is wanting him to have.  He is unsure of what some of them are for.  He is requesting a call back.

## 2020-03-27 NOTE — Telephone Encounter (Signed)
Called patient and LMOVM to return call  Left a detailed voice message to see what patient is referring to.

## 2020-03-28 NOTE — Telephone Encounter (Signed)
Pt called back to let us know that there are no records of immunizations at the [facility]

## 2020-03-28 NOTE — Telephone Encounter (Signed)
The patient would like a call back to discuss his immunizations so he can volunteer at [employer].  Please advise

## 2020-03-28 NOTE — Telephone Encounter (Signed)
I called patient and he stated that he needs his immunizations for him to be able to volunteer at [facility] and he said he needed his Varicella (07/30/1964), ( Flu given on 05/11/2019) and his MMR.  I do not see MMR in his records. Patient would like to know what he needs to do as he said he probably had this vaccination and not sure where they are in his records.  I have printed off his records and have at my desk if you need to see. I will mail these to him.  Please advise.

## 2020-03-28 NOTE — Telephone Encounter (Signed)
We would only have immuniz that were done here.  He can get blood works serology for MMR  And varicella  To confirm immunity  ( they should accept that )  He should  ask if immunity blood tests would be acceptable

## 2020-03-29 DIAGNOSIS — F332 Major depressive disorder, recurrent severe without psychotic features: Secondary | ICD-10-CM | POA: Diagnosis not present

## 2020-03-30 NOTE — Telephone Encounter (Signed)
Called patient and LMOVM to return call  Called patient and left a detailed voice message about the message from Dr. Regis Bill and to call us back if he wants to and if they will accept him to have the MMR and Varicella serology labs. Also mailing the immunization records that we have to address on file.

## 2020-04-05 NOTE — Telephone Encounter (Signed)
Pt stated he will get the blood test done through the volunteer program he is signing up for and I will get with Margarett for the immunization records and mail them out to the address on file that I verified with the pt.   Sending back as [employer]

## 2020-04-05 NOTE — Telephone Encounter (Signed)
Noted! Thank you

## 2020-04-27 DIAGNOSIS — F332 Major depressive disorder, recurrent severe without psychotic features: Secondary | ICD-10-CM | POA: Diagnosis not present

## 2020-05-16 DIAGNOSIS — Z09 Encounter for follow-up examination after completed treatment for conditions other than malignant neoplasm: Secondary | ICD-10-CM | POA: Diagnosis not present

## 2020-05-20 ENCOUNTER — Other Ambulatory Visit: Payer: Self-pay | Admitting: Internal Medicine

## 2020-05-25 ENCOUNTER — Other Ambulatory Visit: Payer: Self-pay | Admitting: Surgery

## 2020-05-25 DIAGNOSIS — F4320 Adjustment disorder, unspecified: Secondary | ICD-10-CM | POA: Diagnosis not present

## 2020-05-25 DIAGNOSIS — F1011 Alcohol abuse, in remission: Secondary | ICD-10-CM | POA: Diagnosis not present

## 2020-05-25 DIAGNOSIS — K4091 Unilateral inguinal hernia, without obstruction or gangrene, recurrent: Secondary | ICD-10-CM | POA: Diagnosis not present

## 2020-05-29 DIAGNOSIS — F332 Major depressive disorder, recurrent severe without psychotic features: Secondary | ICD-10-CM | POA: Diagnosis not present

## 2020-06-23 ENCOUNTER — Encounter: Payer: Federal, State, Local not specified - PPO | Admitting: Internal Medicine

## 2020-07-04 ENCOUNTER — Encounter: Payer: Self-pay | Admitting: Internal Medicine

## 2020-07-04 ENCOUNTER — Other Ambulatory Visit: Payer: Self-pay

## 2020-07-04 ENCOUNTER — Ambulatory Visit (INDEPENDENT_AMBULATORY_CARE_PROVIDER_SITE_OTHER): Payer: Federal, State, Local not specified - PPO | Admitting: Internal Medicine

## 2020-07-04 VITALS — BP 130/70 | HR 58 | Temp 97.9°F | Resp 16 | Ht 69.0 in | Wt 171.1 lb

## 2020-07-04 DIAGNOSIS — Z79899 Other long term (current) drug therapy: Secondary | ICD-10-CM

## 2020-07-04 DIAGNOSIS — Z125 Encounter for screening for malignant neoplasm of prostate: Secondary | ICD-10-CM

## 2020-07-04 DIAGNOSIS — G629 Polyneuropathy, unspecified: Secondary | ICD-10-CM | POA: Diagnosis not present

## 2020-07-04 DIAGNOSIS — Z0000 Encounter for general adult medical examination without abnormal findings: Secondary | ICD-10-CM | POA: Diagnosis not present

## 2020-07-04 DIAGNOSIS — E538 Deficiency of other specified B group vitamins: Secondary | ICD-10-CM | POA: Diagnosis not present

## 2020-07-04 DIAGNOSIS — E785 Hyperlipidemia, unspecified: Secondary | ICD-10-CM

## 2020-07-04 NOTE — Progress Notes (Signed)
Chief Complaint  Patient presents with  . Annual Exam  . Medication Management    HPI: Randall Torres 69 y.o. comes in today for Preventive Medicare exam/ wellness visit and meds .Since last visit. 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   Dissolvable  Per day  Left foot gait ? Not worse not weak   Working on it stays active no foot drop   HLD: medicaiton  Hernia came back    To  Get resurgery   ? If flomax may help.  In past nocturia x 1   Off meds    Health Maintenance  Topic Date Due  . TETANUS/TDAP  12/05/2024  . COLONOSCOPY  08/03/2028  . INFLUENZA VACCINE  Completed  . COVID-19 Vaccine  Completed  . Hepatitis C Screening  Completed  . PNA vac Low Risk Adult  Completed   Health Maintenance Review LIFESTYLE:  Exercise:   Left leg  Tobacco/ETS:n Alcohol: n Sugar beverages: Sleep:7-8  Drug use: no HH:  2   volunteer  3 d per week. [place] now  Hearing: ok  Aids   Vision:  No limitations at present . Last eye check UTD  Safety:  Has smoke detector and wears seat belts.  No firearms. No excess sun exposure. Sees dentist regularly.  Falls:  n  Memory: Felt to be good  , no concern from her or her family.  Depression:under rx dcoing ok  Better on sertraline than lexapro?  Nutrition: Eats well balanced diet; adequate calcium and vitamin D. No swallowing chewing problems.  Injury: no major injuries in the last six months.  Other healthcare providers:  Reviewed today .  Preventive parameters: u  Reviewed   ADLS:   There are no problems or need for assistance  driving, feeding, obtaining food, dressing, toileting and bathing, managing money using phone.  is independent.   ROS:  GEN/ HEENT: No fever, significant weight changes sweats headaches vision problems hearing changes, CV/ PULM; No chest pain shortness of breath cough, syncope,edema  change in  exercise tolerance. GI /GU: No adominal pain, vomiting, change in bowel habits. No blood in the stool. No significant GU symptoms. SKIN/HEME: ,no acute skin rashes suspicious lesions or bleeding. No lymphadenopathy, nodules, masses.  NEURO/ PSYCH:  No neurologic signs such as weakness numbness. No depression anxiety. IMM/ Allergy: No unusual infections.  Allergy .   REST of 12 system review negative except as per HPI   Past Medical History:  Diagnosis Date  . ADD (attention deficit disorder)   . ADJUSTMENT DISORDER 09/29/2007   Qualifier: Diagnosis of  By: Fabian Sharp MD, Neta Mends   . BPH (benign prostatic hyperplasia)    has seen urologist  . Hyperlipidemia   . Inguinal hernia    bilateral  . Personal history of colonic adenoma 06/30/2008  . Rhinitis 12/31/2010    Family History  Problem Relation Age of Onset  . Diabetes Other   . Other Other        CVA age 53  . Other Other        sleep problems  . Myasthenia gravis Brother 32       deceased  . Hyperlipidemia Other   . Alcohol abuse Other        Uncle  . Other Mother   . Stroke Father   .  Diabetes Father   . Throat cancer Sister   . Breast cancer Sister   . Healthy Son   . Neuropathy Neg Hx     Social History   Socioeconomic History  . Marital status: Married    Spouse name: Not on file  . Number of children: Not on file  . Years of education: Not on file  . Highest education level: Not on file  Occupational History  . Not on file  Tobacco Use  . Smoking status: Never Smoker  . Smokeless tobacco: Never Used  Vaping Use  . Vaping Use: Never used  Substance and Sexual Activity  . Alcohol use: No    Alcohol/week: 0.0 standard drinks    Comment: Hx of alcohol abuse - none since 2010  . Drug use: No  . Sexual activity: Not on file  Other Topics Concern  . Not on file  Social History Narrative   Occupation: Publishing rights manager.   Highest level of education: law school   Married   Exercise: Swims daily  5-6 days  per week.    Goes to AA  5 day per week or so.      HH of 2 dog     Wife recent dx and rx for breast cancer    2 children.   Lives with wife in a 2 story home.    Social Determinants of Health   Financial Resource Strain:   . Difficulty of Paying Living Expenses: Not on file  Food Insecurity:   . Worried About Programme researcher, broadcasting/film/video in the Last Year: Not on file  . Ran Out of Food in the Last Year: Not on file  Transportation Needs:   . Lack of Transportation (Medical): Not on file  . Lack of Transportation (Non-Medical): Not on file  Physical Activity:   . Days of Exercise per Week: Not on file  . Minutes of Exercise per Session: Not on file  Stress:   . Feeling of Stress : Not on file  Social Connections:   . Frequency of Communication with Friends and Family: Not on file  . Frequency of Social Gatherings with Friends and Family: Not on file  . Attends Religious Services: Not on file  . Active Member of Clubs or Organizations: Not on file  . Attends Banker Meetings: Not on file  . Marital Status: Not on file    Outpatient Encounter Medications as of 07/04/2020  Medication Sig  . clonazePAM (KLONOPIN) 0.5 MG tablet   . cyanocobalamin 1000 MCG tablet Take 1,000 mcg by mouth daily.  . NON FORMULARY   . sertraline (ZOLOFT) 100 MG tablet Take 100 mg by mouth daily.  . simvastatin (ZOCOR) 40 MG tablet Take 1 tablet (40 mg total) by mouth daily. Please schedule your yearly visit for refills. [phone]  . [DISCONTINUED] alfuzosin (UROXATRAL) 10 MG 24 hr tablet TK 1 T PO QD  . [DISCONTINUED] Naproxen Sodium (ALEVE PO) Take by mouth. As needed   Facility-Administered Encounter Medications as of 07/04/2020  Medication  . cyanocobalamin ((VITAMIN B-12)) injection 1,000 mcg    EXAM:  BP 130/70 (BP Location: Left Arm, Patient Position: Sitting, Cuff Size: Normal)   Pulse (!) 58   Temp 97.9 F (36.6 C) (Oral)   Resp 16   Ht 5\' 9"  (1.753 m)   Wt 171 lb 2 oz  (77.6 kg)   BMI 25.27 kg/m   Body mass index is 25.27 kg/m.  Physical Exam: Vital  signs reviewed VVZ:SMOL is a well-developed well-nourished alert cooperative   who appears stated age in no acute distress.  HEENT: normocephalic atraumatic , Eyes: PERRL EOM's full, conjunctiva clear, Nares: paten,t no deformity discharge or tenderness., Ears: aids . Mouth masked  NECK: supple without masses, thyromegaly or bruits. CHEST/PULM:  Clear to auscultation and percussion breath sounds equal no wheeze , rales or rhonchi. No chest wall deformities or tenderness. CV: PMI is nondisplaced, S1 S2 no gallops, murmurs, rubs. Peripheral pulses are full without delay.No JVD .  ABDOMEN: Bowel sounds normal nontender  No guard or rebound, no hepato splenomegal no CVA tenderness.   Extremtities:  No clubbing cyanosis or edema, no acute joint swelling or redness no focal atrophy naild thickening  NEURO:  Oriented x3, cranial nerves 3-12 appear to be intact, no obvious focal weakness,gait within normal limits no abnormal reflexes or asymmetrical to heel walk  Ok  SKIN: No acute rashes normal turgor, color, no bruising or petechiae. PSYCH: Oriented, good eye contact, no obvious depression anxiety, cognition and judgment appear normal. A bit down  With discussing reitrement LN: no cervical axillary inguinal adenopathy Rectal prostate no masses 1-2+  No noted deficits in memory,  and speech.   Lab Results  Component Value Date   WBC 5.7 06/22/2019   HGB 13.9 06/22/2019   HCT 41.9 06/22/2019   PLT 301.0 06/22/2019   GLUCOSE 100 (H) 06/22/2019   CHOL 180 06/22/2019   TRIG 66.0 06/22/2019   HDL 48.80 06/22/2019   LDLDIRECT 147.2 11/29/2009   LDLCALC 118 (H) 06/22/2019   ALT 19 06/22/2019   AST 26 06/22/2019   NA 140 06/22/2019   K 5.2 (H) 06/22/2019   CL 102 06/22/2019   CREATININE 0.78 06/22/2019   BUN 21 06/22/2019   CO2 31 06/22/2019   TSH 2.21 06/22/2019   PSA 0.86 06/22/2019   HGBA1C 6.0  06/22/2019    ASSESSMENT AND PLAN:  Discussed the following assessment and plan:  Visit for preventive health examination  Medication management - Plan: TSH, Hepatic function panel, Lipid panel, BASIC METABOLIC PANEL WITH GFR, CBC with Differential/Platelet, PSA, Vitamin B12, Vitamin B12, PSA, CBC with Differential/Platelet, BASIC METABOLIC PANEL WITH GFR, Lipid panel, Hepatic function panel, TSH  B12 deficiency - Plan: TSH, Hepatic function panel, Lipid panel, BASIC METABOLIC PANEL WITH GFR, CBC with Differential/Platelet, PSA, Vitamin B12, Vitamin B12, PSA, CBC with Differential/Platelet, BASIC METABOLIC PANEL WITH GFR, Lipid panel, Hepatic function panel, TSH  Screening PSA (prostate specific antigen) - Plan: TSH, Hepatic function panel, Lipid panel, BASIC METABOLIC PANEL WITH GFR, CBC with Differential/Platelet, PSA, Vitamin B12, Vitamin B12, PSA, CBC with Differential/Platelet, BASIC METABOLIC PANEL WITH GFR, Lipid panel, Hepatic function panel, TSH  Hyperlipidemia, unspecified hyperlipidemia type - Plan: TSH, Hepatic function panel, Lipid panel, BASIC METABOLIC PANEL WITH GFR, CBC with Differential/Platelet, PSA, Vitamin B12, Vitamin B12, PSA, CBC with Differential/Platelet, BASIC METABOLIC PANEL WITH GFR, Lipid panel, Hepatic function panel, TSH  Neuropathy - stable placed on b12 per neuro - Plan: TSH, Hepatic function panel, Lipid panel, BASIC METABOLIC PANEL WITH GFR, CBC with Differential/Platelet, PSA, Vitamin B12, Vitamin B12, PSA, CBC with Differential/Platelet, BASIC METABOLIC PANEL WITH GFR, Lipid panel, Hepatic function panel, TSH Monitoring labs today  If all ok then yealry cpx labs etc  Patient Care Team: Madelin Headings, MD as PCP - General Rajakumar Thotakura (Psychiatry) Glendale Chard, DO as Consulting Physician (Neurology)  Patient Instructions  Your exam is good .  Will notify you  of labs when available.   Let us know if you want to try flomax again .     Health Maintenance, Male Adopting a healthy lifestyle and getting preventive care are important in promoting health and wellness. Ask your health care provider about:  The right schedule for you to have regular tests and exams.  Things you can do on your own to prevent diseases and keep yourself healthy. What should I know about diet, weight, and exercise? Eat a healthy diet   Eat a diet that includes plenty of vegetables, fruits, low-fat dairy products, and lean protein.  Do not eat a lot of foods that are high in solid fats, added sugars, or sodium. Maintain a healthy weight Body mass index (BMI) is a measurement that can be used to identify possible weight problems. It estimates body fat based on height and weight. Your health care provider can help determine your BMI and help you achieve or maintain a healthy weight. Get regular exercise Get regular exercise. This is one of the most important things you can do for your health. Most adults should:  Exercise for at least 150 minutes each week. The exercise should increase your heart rate and make you sweat (moderate-intensity exercise).  Do strengthening exercises at least twice a week. This is in addition to the moderate-intensity exercise.  Spend less time sitting. Even light physical activity can be beneficial. Watch cholesterol and blood lipids Have your blood tested for lipids and cholesterol at 71 years of age, then have this test every 5 years. You may need to have your cholesterol levels checked more often if:  Your lipid or cholesterol levels are high.  You are older than 71 years of age.  You are at high risk for heart disease. What should I know about cancer screening? Many types of cancers can be detected early and may often be prevented. Depending on your health history and family history, you may need to have cancer screening at various ages. This may include screening for:  Colorectal cancer.  Prostate  cancer.  Skin cancer.  Lung cancer. What should I know about heart disease, diabetes, and high blood pressure? Blood pressure and heart disease  High blood pressure causes heart disease and increases the risk of stroke. This is more likely to develop in people who have high blood pressure readings, are of African descent, or are overweight.  Talk with your health care provider about your target blood pressure readings.  Have your blood pressure checked: ? Every 3-5 years if you are 7218-71 years of age. ? Every year if you are 71 years old or older.  If you are between the ages of 1765 and 5775 and are a current or former smoker, ask your health care provider if you should have a one-time screening for abdominal aortic aneurysm (AAA). Diabetes Have regular diabetes screenings. This checks your fasting blood sugar level. Have the screening done:  Once every three years after age 71 if you are at a normal weight and have a low risk for diabetes.  More often and at a younger age if you are overweight or have a high risk for diabetes. What should I know about preventing infection? Hepatitis B If you have a higher risk for hepatitis B, you should be screened for this virus. Talk with your health care provider to find out if you are at risk for hepatitis B infection. Hepatitis C Blood testing is recommended for:  Everyone born from [birth year] through  [birth year].  Anyone with known risk factors for hepatitis C. Sexually transmitted infections (STIs)  You should be screened each year for STIs, including gonorrhea and chlamydia, if: ? You are sexually active and are younger than 71 years of age. ? You are older than 71 years of age and your health care provider tells you that you are at risk for this type of infection. ? Your sexual activity has changed since you were last screened, and you are at increased risk for chlamydia or gonorrhea. Ask your health care provider if you are at risk.  Ask your  health care provider about whether you are at high risk for HIV. Your health care provider may recommend a prescription medicine to help prevent HIV infection. If you choose to take medicine to prevent HIV, you should first get tested for HIV. You should then be tested every 3 months for as long as you are taking the medicine. Follow these instructions at home: Lifestyle  Do not use any products that contain nicotine or tobacco, such as cigarettes, e-cigarettes, and chewing tobacco. If you need help quitting, ask your health care provider.  Do not use street drugs.  Do not share needles.  Ask your health care provider for help if you need support or information about quitting drugs. Alcohol use  Do not drink alcohol if your health care provider tells you not to drink.  If you drink alcohol: ? Limit how much you have to 0-2 drinks a day. ? Be aware of how much alcohol is in your drink. In the [place] one drink equals one 12 oz bottle of beer (355 mL), one 5 oz glass of wine (148 mL), or one 1 oz glass of hard liquor (44 mL). General instructions  Schedule regular health, dental, and eye exams.  Stay current with your vaccines.  Tell your health care provider if: ? You often feel depressed. ? You have ever been abused or do not feel safe at home. Summary  Adopting a healthy lifestyle and getting preventive care are important in promoting health and wellness.  Follow your health care provider's instructions about healthy diet, exercising, and getting tested or screened for diseases.  Follow your health care provider's instructions on monitoring your cholesterol and blood pressure. This information is not intended to replace advice given to you by your health care provider. Make sure you discuss any questions you have with your health care provider. Document Revised: 08/26/2018 Document Reviewed: 08/26/2018 Elsevier Patient Education  2020 [employer].    [place] K. Caisley Baxendale  M.D.

## 2020-07-04 NOTE — Patient Instructions (Signed)
Your exam is good .  Will notify you  of labs when available.   Let us know if you want to try flomax again .    Health Maintenance, Male Adopting a healthy lifestyle and getting preventive care are important in promoting health and wellness. Ask your health care provider about:  The right schedule for you to have regular tests and exams.  Things you can do on your own to prevent diseases and keep yourself healthy. What should I know about diet, weight, and exercise? Eat a healthy diet   Eat a diet that includes plenty of vegetables, fruits, low-fat dairy products, and lean protein.  Do not eat a lot of foods that are high in solid fats, added sugars, or sodium. Maintain a healthy weight Body mass index (BMI) is a measurement that can be used to identify possible weight problems. It estimates body fat based on height and weight. Your health care provider can help determine your BMI and help you achieve or maintain a healthy weight. Get regular exercise Get regular exercise. This is one of the most important things you can do for your health. Most adults should:  Exercise for at least 150 minutes each week. The exercise should increase your heart rate and make you sweat (moderate-intensity exercise).  Do strengthening exercises at least twice a week. This is in addition to the moderate-intensity exercise.  Spend less time sitting. Even light physical activity can be beneficial. Watch cholesterol and blood lipids Have your blood tested for lipids and cholesterol at 71 years of age, then have this test every 5 years. You may need to have your cholesterol levels checked more often if:  Your lipid or cholesterol levels are high.  You are older than 71 years of age.  You are at high risk for heart disease. What should I know about cancer screening? Many types of cancers can be detected early and may often be prevented. Depending on your health history and family history, you may need  to have cancer screening at various ages. This may include screening for:  Colorectal cancer.  Prostate cancer.  Skin cancer.  Lung cancer. What should I know about heart disease, diabetes, and high blood pressure? Blood pressure and heart disease  High blood pressure causes heart disease and increases the risk of stroke. This is more likely to develop in people who have high blood pressure readings, are of African descent, or are overweight.  Talk with your health care provider about your target blood pressure readings.  Have your blood pressure checked: ? Every 3-5 years if you are 31-83 years of age. ? Every year if you are 86 years old or older.  If you are between the ages of 23 and 68 and are a current or former smoker, ask your health care provider if you should have a one-time screening for abdominal aortic aneurysm (AAA). Diabetes Have regular diabetes screenings. This checks your fasting blood sugar level. Have the screening done:  Once every three years after age 75 if you are at a normal weight and have a low risk for diabetes.  More often and at a younger age if you are overweight or have a high risk for diabetes. What should I know about preventing infection? Hepatitis B If you have a higher risk for hepatitis B, you should be screened for this virus. Talk with your health care provider to find out if you are at risk for hepatitis B infection. Hepatitis C Blood testing  is recommended for:  Everyone born from [birth year] through 1967.  Anyone with known risk factors for hepatitis C. Sexually transmitted infections (STIs)  You should be screened each year for STIs, including gonorrhea and chlamydia, if: ? You are sexually active and are younger than 71 years of age. ? You are older than 71 years of age and your health care provider tells you that you are at risk for this type of infection. ? Your sexual activity has changed since you were last screened, and you are at  increased risk for chlamydia or gonorrhea. Ask your health care provider if you are at risk.  Ask your health care provider about whether you are at high risk for HIV. Your health care provider may recommend a prescription medicine to help prevent HIV infection. If you choose to take medicine to prevent HIV, you should first get tested for HIV. You should then be tested every 3 months for as long as you are taking the medicine. Follow these instructions at home: Lifestyle  Do not use any products that contain nicotine or tobacco, such as cigarettes, e-cigarettes, and chewing tobacco. If you need help quitting, ask your health care provider.  Do not use street drugs.  Do not share needles.  Ask your health care provider for help if you need support or information about quitting drugs. Alcohol use  Do not drink alcohol if your health care provider tells you not to drink.  If you drink alcohol: ? Limit how much you have to 0-2 drinks a day. ? Be aware of how much alcohol is in your drink. In the [place] one drink equals one 12 oz bottle of beer (355 mL), one 5 oz glass of wine (148 mL), or one 1 oz glass of hard liquor (44 mL). General instructions  Schedule regular health, dental, and eye exams.  Stay current with your vaccines.  Tell your health care provider if: ? You often feel depressed. ? You have ever been abused or do not feel safe at home. Summary  Adopting a healthy lifestyle and getting preventive care are important in promoting health and wellness.  Follow your health care provider's instructions about healthy diet, exercising, and getting tested or screened for diseases.  Follow your health care provider's instructions on monitoring your cholesterol and blood pressure. This information is not intended to replace advice given to you by your health care provider. Make sure you discuss any questions you have with your health care provider. Document Revised: 08/26/2018  Document Reviewed: 08/26/2018 Elsevier Patient Education  2020 [employer].

## 2020-07-05 LAB — LIPID PANEL
Cholesterol: 164 mg/dL (ref <200)
HDL: 55 mg/dL (ref >=40)
LDL Cholesterol (Calc): 95 mg/dL (calc)
Non-HDL Cholesterol (Calc): 109 mg/dL (calc) (ref <130)
Total CHOL/HDL Ratio: 3 (calc) (ref <5.0)
Triglycerides: 57 mg/dL (ref <150)

## 2020-07-05 LAB — BASIC METABOLIC PANEL WITH GFR
BUN: 25 mg/dL (ref 7–25)
CO2: 31 mmol/L (ref 20–32)
Calcium: 9.2 mg/dL (ref 8.6–10.3)
Chloride: 101 mmol/L (ref 98–110)
Creat: 0.77 mg/dL (ref 0.70–1.18)
GFR, Est African American: 107 mL/min/{1.73_m2} (ref >=60)
GFR, Est Non African American: 92 mL/min/{1.73_m2} (ref >=60)
Glucose, Bld: 98 mg/dL (ref 65–99)
Potassium: 4.2 mmol/L (ref 3.5–5.3)
Sodium: 138 mmol/L (ref 135–146)

## 2020-07-05 LAB — CBC WITH DIFFERENTIAL/PLATELET
Absolute Monocytes: 467 cells/uL (ref 200–950)
Basophils Absolute: 58 cells/uL (ref 0–200)
Basophils Relative: 0.8 %
Eosinophils Absolute: 219 cells/uL (ref 15–500)
Eosinophils Relative: 3 %
HCT: 40.6 % (ref 38.5–50.0)
Hemoglobin: 13.7 g/dL (ref 13.2–17.1)
Lymphs Abs: 1891 cells/uL (ref 850–3900)
MCH: 30.6 pg (ref 27.0–33.0)
MCHC: 33.7 g/dL (ref 32.0–36.0)
MCV: 90.8 fL (ref 80.0–100.0)
MPV: 10.6 fL (ref 7.5–12.5)
Monocytes Relative: 6.4 %
Neutro Abs: 4665 cells/uL (ref 1500–7800)
Neutrophils Relative %: 63.9 %
Platelets: 275 10*3/uL (ref 140–400)
RBC: 4.47 10*6/uL (ref 4.20–5.80)
RDW: 12.5 % (ref 11.0–15.0)
Total Lymphocyte: 25.9 %
WBC: 7.3 10*3/uL (ref 3.8–10.8)

## 2020-07-05 LAB — HEPATIC FUNCTION PANEL
AG Ratio: 1.4 (calc) (ref 1.0–2.5)
ALT: 17 U/L (ref 9–46)
AST: 25 U/L (ref 10–35)
Albumin: 4.1 g/dL (ref 3.6–5.1)
Alkaline phosphatase (APISO): 63 U/L (ref 35–144)
Bilirubin, Direct: 0.2 mg/dL (ref 0.0–0.2)
Globulin: 3 g/dL (calc) (ref 1.9–3.7)
Indirect Bilirubin: 0.5 mg/dL (calc) (ref 0.2–1.2)
Total Bilirubin: 0.7 mg/dL (ref 0.2–1.2)
Total Protein: 7.1 g/dL (ref 6.1–8.1)

## 2020-07-05 LAB — VITAMIN B12: Vitamin B-12: 1611 pg/mL — ABNORMAL HIGH (ref 200–1100)

## 2020-07-05 LAB — PSA: PSA: 0.72 ng/mL (ref <=4.0)

## 2020-07-05 LAB — TSH: TSH: 1.93 mIU/L (ref 0.40–4.50)

## 2020-07-05 NOTE — Progress Notes (Signed)
All normal tests except vit b12 is high .    You can decrease dose to  3- days per week  of b12   and  should be ok

## 2020-07-06 ENCOUNTER — Other Ambulatory Visit: Payer: Self-pay

## 2020-07-06 ENCOUNTER — Encounter (HOSPITAL_BASED_OUTPATIENT_CLINIC_OR_DEPARTMENT_OTHER): Payer: Self-pay | Admitting: Surgery

## 2020-07-06 NOTE — Progress Notes (Signed)

## 2020-07-10 ENCOUNTER — Other Ambulatory Visit (HOSPITAL_COMMUNITY)
Admission: RE | Admit: 2020-07-10 | Discharge: 2020-07-10 | Disposition: A | Discharge status: Home / Self Care | Payer: Federal, State, Local not specified - PPO | Source: Ambulatory Visit | Attending: Surgery | Admitting: Surgery

## 2020-07-10 DIAGNOSIS — Z20822 Contact with and (suspected) exposure to covid-19: Secondary | ICD-10-CM | POA: Diagnosis not present

## 2020-07-10 DIAGNOSIS — Z01812 Encounter for preprocedural laboratory examination: Secondary | ICD-10-CM | POA: Insufficient documentation

## 2020-07-10 LAB — SARS CORONAVIRUS 2 (TAT 6-24 HRS): SARS Coronavirus 2: NEGATIVE

## 2020-07-12 MED ORDER — TAMSULOSIN HCL 0.4 MG PO CAPS: 0.4000 mg | ORAL_CAPSULE | Freq: Every day | ORAL | 3 refills | Status: DC

## 2020-07-12 NOTE — H&P (Signed)
Randall Torres  Location: [facility] Patient #: [id number] DOB: 1951-06-23 Married / Language: English / Race: White Male  History of Present Illness   The patient is a 69 year old male who presents with an inguinal hernia.  The PCP is Dr. Coral Ceo  He comes by himself. He is fairly tense.  He saw Puja about one week ago for the inguinal hernia. She set him up in my office. He is s/p laparoscopic preperitoneal repair of bilateral inguinal hernias with mesh on October 18, 2014 by Dr. Derrell Lolling.  He saw Dr. Retta Diones about 8 months ago and Dr. Retta Diones told him that he had a recurrent inguinal hernia. In fact, he said that he had bilateral recurrences (I could not feel one on the left today).  He was upset from the last hernia operation for: 1. No one answered his problems on the phone from our office. He went to the ER for constipation. It sounds like he had a BM in the ER (not sure if he was seen). 2. He had constipation post op last time and felt he was not given any direction. 3. He did not like the narcotics last time. 4. He does not look for surgery - he said that surgery is costly, hard on the family, and he is upset about the recurrence.  I discussed the indications and complications of hernia surgery with the patient. I discussed both the laparoscopic and open approach to hernia repair.. The potential risks of hernia surgery include, but are not limited to, bleeding, infection, open surgery, nerve injury, and recurrence of the hernia. I provided the patient literature about hernia surgery. We talked about the use of mesh in hernias and their risks. The risk of mesh include chronic infection, erosion to other structures, and chronic pain.  He has some limits on the surgery timing - he is going to the beach from 10/1 - 10/9. He does not want the surgery on a Friday, so the office is open to answer his  questions.  Plan: 1. Open right inguinal hernia repair, 2. Flomax - to start 3 days before surgery, 3. To try to get Dr. Lenoria Chime last office notes  Review of Systems as stated in this history (HPI) or in the review of systems. Otherwise all other 12 point ROS are negative  Past Medical History: 1. Laparoscopic bilateral inguinal hernia repair - 10/18/2014 Derrell Lolling 2. Psych - Dr. Kathaleen Maser Nerves 3. Recovering alcoholic - 2010 4. Partial knee replacement - 2001 5. He saw Dr. Retta Diones for a "urinary tract procedure" that helped initially, but has not helped as much recently. His urologist had been formerly [facility]. He talked about prostate surgery, but is not anxious to have this He had tried Flomax before, but it did not help.  Social History: Married. Two children - born 36 and 41 He is a retired Clinical research associate  The patient's family history was non contributory.  Allergies (Chanel Lonni Fix, CMA; 05/25/2020 1:29 PM) No Known Drug Allergies  [09/21/2014]: Allergies Reconciled   Medication History (Chanel Lonni Fix, CMA; 05/25/2020 1:29 PM) Sertraline HCl (100MG  Tablet, Oral) Active. clonazePAM (0.5MG  Tablet, Oral) Active. Simvastatin (40MG  Tablet, Oral) Active. Medications Reconciled  Vitals (Chanel Nolan CMA; 05/25/2020 1:30 PM) 05/25/2020 1:29 PM Weight: 175.13 lb Height: 69in Body Surface Area: 1.95 m Body Mass Index: 25.86 kg/m  Temp.: 96.47F  Pulse: 84 (Regular)  BP: 120/80(Sitting, Left Arm, Standard)   Physical Exam  General: WN WM who isalert and  generally healthy appearing. HEENT: Normal. Pupils equal.  Neck: Supple. No mass. No thyroid mass.  Lymph Nodes: No supraclavicular or cervical nodes.  Lungs: Clear to auscultation and symmetric breath sounds. Heart: RRR. No murmur or rub.  Abdomen: Soft. No mass. No tenderness. Normal bowel sounds. No abdominal scars.  He has a small to medium right inguinal  hernia. It reduces when he lays down.  I do not feel a left inguinal hernia.  Rectal: Not done.  Extremities: Good strength and ROM in upper and lower extremities.  Neurologic: Grossly intact to motor and sensory function. Psychiatric: Has normal mood and affect. Behavior is normal.  Assessment & Plan  1.  RECURRENT RIGHT INGUINAL HERNIA (K40.91)  Plan:  1. Open right inguinal hernia repair  2. Flomax - to start 3 days before surgery  2.  S/P INGUINAL HERNIA REPAIR, FOLLOW-UP EXAM   Story: lap bilateral inguinal hernia repair - 10/18/2014 - Derrell Lolling 3.  HISTORY OF ALCOHOL ABUSE - off since 2010 4.  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 Kathaleen Maser  Nerves 5. He saw Dr. Retta Diones for a "urinary tract procedure" that helped initially, but has not helped as much recently. His urologist had been formerly [facility].  He had tried Flomax before, but it did not help.  Ovidio Kin, MD, [facility] Office phone:  [phone]

## 2020-07-12 NOTE — Telephone Encounter (Signed)
I sent this in

## 2020-07-13 ENCOUNTER — Ambulatory Visit (HOSPITAL_BASED_OUTPATIENT_CLINIC_OR_DEPARTMENT_OTHER)
Admission: RE | Admit: 2020-07-13 | Discharge: 2020-07-13 | Disposition: A | Discharge status: Home / Self Care | Payer: Federal, State, Local not specified - PPO | Attending: Surgery | Admitting: Surgery

## 2020-07-13 ENCOUNTER — Encounter (HOSPITAL_BASED_OUTPATIENT_CLINIC_OR_DEPARTMENT_OTHER): Payer: Self-pay | Admitting: Surgery

## 2020-07-13 ENCOUNTER — Other Ambulatory Visit: Payer: Self-pay

## 2020-07-13 ENCOUNTER — Encounter (HOSPITAL_BASED_OUTPATIENT_CLINIC_OR_DEPARTMENT_OTHER)
Admission: RE | Disposition: A | Discharge status: Home / Self Care | Payer: Self-pay | Source: Home / Self Care | Attending: Surgery

## 2020-07-13 ENCOUNTER — Ambulatory Visit (HOSPITAL_BASED_OUTPATIENT_CLINIC_OR_DEPARTMENT_OTHER): Payer: Federal, State, Local not specified - PPO | Admitting: Anesthesiology

## 2020-07-13 DIAGNOSIS — K4091 Unilateral inguinal hernia, without obstruction or gangrene, recurrent: Secondary | ICD-10-CM | POA: Insufficient documentation

## 2020-07-13 DIAGNOSIS — Z96659 Presence of unspecified artificial knee joint: Secondary | ICD-10-CM | POA: Diagnosis not present

## 2020-07-13 HISTORY — PX: INSERTION OF MESH: SHX5868

## 2020-07-13 HISTORY — PX: INGUINAL HERNIA REPAIR: SHX194

## 2020-07-13 SURGERY — REPAIR, HERNIA, INGUINAL, ADULT
Anesthesia: General | Site: Groin | Laterality: Right

## 2020-07-13 MED ORDER — FENTANYL CITRATE (PF) 100 MCG/2ML IJ SOLN
INTRAMUSCULAR | Status: AC
  Filled 2020-07-13: qty 2

## 2020-07-13 MED ORDER — PROPOFOL 10 MG/ML IV BOLUS
INTRAVENOUS | Status: DC | PRN
  Administered 2020-07-13: 150 mg via INTRAVENOUS

## 2020-07-13 MED ORDER — ACETAMINOPHEN 500 MG PO TABS
ORAL_TABLET | ORAL | Status: AC
  Filled 2020-07-13: qty 2

## 2020-07-13 MED ORDER — OXYCODONE HCL 5 MG/5ML PO SOLN: 5.0000 mg | Freq: Once | ORAL | Status: DC | PRN

## 2020-07-13 MED ORDER — ACETAMINOPHEN 500 MG PO TABS
1000.0000 mg | ORAL_TABLET | ORAL | Status: AC
  Administered 2020-07-13: 1000 mg via ORAL

## 2020-07-13 MED ORDER — KETOROLAC TROMETHAMINE 30 MG/ML IJ SOLN: 15.0000 mg | Freq: Once | INTRAMUSCULAR | Status: DC | PRN

## 2020-07-13 MED ORDER — LACTATED RINGERS IV SOLN: INTRAVENOUS | Status: DC

## 2020-07-13 MED ORDER — CEFAZOLIN SODIUM-DEXTROSE 2-4 GM/100ML-% IV SOLN
INTRAVENOUS | Status: AC
  Filled 2020-07-13: qty 100

## 2020-07-13 MED ORDER — LIDOCAINE HCL 1 % IJ SOLN
INTRAMUSCULAR | Status: DC | PRN
  Administered 2020-07-13: 90 mg via INTRADERMAL

## 2020-07-13 MED ORDER — PROPOFOL 10 MG/ML IV BOLUS
INTRAVENOUS | Status: AC
  Filled 2020-07-13: qty 20

## 2020-07-13 MED ORDER — LIDOCAINE 2% (20 MG/ML) 5 ML SYRINGE
INTRAMUSCULAR | Status: AC
  Filled 2020-07-13: qty 5

## 2020-07-13 MED ORDER — FENTANYL CITRATE (PF) 100 MCG/2ML IJ SOLN
INTRAMUSCULAR | Status: DC | PRN
  Administered 2020-07-13: 50 ug via INTRAVENOUS
  Administered 2020-07-13 (×2): 25 ug via INTRAVENOUS

## 2020-07-13 MED ORDER — BUPIVACAINE LIPOSOME 1.3 % IJ SUSP
INTRAMUSCULAR | Status: DC | PRN
  Administered 2020-07-13: 20 mL

## 2020-07-13 MED ORDER — ONDANSETRON HCL 4 MG/2ML IJ SOLN
INTRAMUSCULAR | Status: DC | PRN
  Administered 2020-07-13: 4 mg via INTRAVENOUS

## 2020-07-13 MED ORDER — MIDAZOLAM HCL 5 MG/5ML IJ SOLN
INTRAMUSCULAR | Status: DC | PRN
  Administered 2020-07-13: 1 mg via INTRAVENOUS

## 2020-07-13 MED ORDER — EPHEDRINE SULFATE 50 MG/ML IJ SOLN
INTRAMUSCULAR | Status: DC | PRN
  Administered 2020-07-13: 10 mg via INTRAVENOUS

## 2020-07-13 MED ORDER — BUPIVACAINE LIPOSOME 1.3 % IJ SUSP
INTRAMUSCULAR | Status: AC
  Filled 2020-07-13: qty 20

## 2020-07-13 MED ORDER — DEXAMETHASONE SODIUM PHOSPHATE 10 MG/ML IJ SOLN
INTRAMUSCULAR | Status: AC
  Filled 2020-07-13: qty 1

## 2020-07-13 MED ORDER — CHLORHEXIDINE GLUCONATE 4 % EX LIQD: 60.0000 mL | Freq: Once | CUTANEOUS | Status: DC

## 2020-07-13 MED ORDER — HYDROCODONE-ACETAMINOPHEN 5-325 MG PO TABS: 1.0000 | ORAL_TABLET | Freq: Four times a day (QID) | ORAL | 0 refills | Status: DC | PRN

## 2020-07-13 MED ORDER — OXYCODONE HCL 5 MG PO TABS: 5.0000 mg | ORAL_TABLET | Freq: Once | ORAL | Status: DC | PRN

## 2020-07-13 MED ORDER — ONDANSETRON HCL 4 MG/2ML IJ SOLN
INTRAMUSCULAR | Status: AC
  Filled 2020-07-13: qty 2

## 2020-07-13 MED ORDER — CEFAZOLIN SODIUM-DEXTROSE 2-4 GM/100ML-% IV SOLN
2.0000 g | INTRAVENOUS | Status: AC
  Administered 2020-07-13: 2 g via INTRAVENOUS

## 2020-07-13 MED ORDER — MIDAZOLAM HCL 2 MG/2ML IJ SOLN
INTRAMUSCULAR | Status: AC
  Filled 2020-07-13: qty 2

## 2020-07-13 MED ORDER — EPHEDRINE 5 MG/ML INJ
INTRAVENOUS | Status: AC
  Filled 2020-07-13: qty 10

## 2020-07-13 MED ORDER — DEXAMETHASONE SODIUM PHOSPHATE 4 MG/ML IJ SOLN
INTRAMUSCULAR | Status: DC | PRN
  Administered 2020-07-13: 5 mg via INTRAVENOUS

## 2020-07-13 MED ORDER — FENTANYL CITRATE (PF) 100 MCG/2ML IJ SOLN: 25.0000 ug | INTRAMUSCULAR | Status: DC | PRN

## 2020-07-13 MED ORDER — ATROPINE SULFATE 1 MG/ML IJ SOLN
INTRAMUSCULAR | Status: DC | PRN
  Administered 2020-07-13: .5 mg via INTRAVENOUS

## 2020-07-13 MED ORDER — BUPIVACAINE HCL (PF) 0.25 % IJ SOLN
INTRAMUSCULAR | Status: DC | PRN
  Administered 2020-07-13: 20 mL

## 2020-07-13 SURGICAL SUPPLY — 53 items
ADH SKN CLS APL DERMABOND .7 (GAUZE/BANDAGES/DRESSINGS) ×1
APL PRP STRL LF DISP 70% ISPRP (MISCELLANEOUS) ×1
APL SKNCLS STERI-STRIP NONHPOA (GAUZE/BANDAGES/DRESSINGS)
BENZOIN TINCTURE PRP APPL 2/3 (GAUZE/BANDAGES/DRESSINGS) IMPLANT
BLADE CLIPPER SURG (BLADE) ×2 IMPLANT
BLADE SURG 10 STRL SS (BLADE) ×2 IMPLANT
CHLORAPREP W/TINT 26 (MISCELLANEOUS) ×2 IMPLANT
CLEANER CAUTERY TIP 5X5 PAD (MISCELLANEOUS) ×1 IMPLANT
COVER BACK TABLE 60X90IN (DRAPES) ×2 IMPLANT
COVER MAYO STAND STRL (DRAPES) ×2 IMPLANT
COVER WAND RF STERILE (DRAPES) IMPLANT
DECANTER SPIKE VIAL GLASS SM (MISCELLANEOUS) ×2 IMPLANT
DERMABOND ADVANCED (GAUZE/BANDAGES/DRESSINGS) ×1
DERMABOND ADVANCED .7 DNX12 (GAUZE/BANDAGES/DRESSINGS) ×1 IMPLANT
DRAIN PENROSE 1/2X12 LTX STRL (WOUND CARE) ×2 IMPLANT
DRAPE LAPAROTOMY T 102X78X121 (DRAPES) ×2 IMPLANT
DRAPE UTILITY XL STRL (DRAPES) ×2 IMPLANT
ELECT REM PT RETURN 9FT ADLT (ELECTROSURGICAL) ×2
ELECTRODE REM PT RTRN 9FT ADLT (ELECTROSURGICAL) ×1 IMPLANT
GAUZE SPONGE 4X4 12PLY STRL LF (GAUZE/BANDAGES/DRESSINGS) IMPLANT
GLOVE BIOGEL PI IND STRL 6.5 (GLOVE) IMPLANT
GLOVE BIOGEL PI INDICATOR 6.5 (GLOVE) ×1
GLOVE ECLIPSE 6.5 STRL STRAW (GLOVE) ×1 IMPLANT
GLOVE SURG SYN 7.5  E (GLOVE) ×2
GLOVE SURG SYN 7.5 E (GLOVE) ×1 IMPLANT
GLOVE SURG SYN 7.5 PF PI (GLOVE) ×1 IMPLANT
GOWN STRL REUS W/ TWL LRG LVL3 (GOWN DISPOSABLE) ×2 IMPLANT
GOWN STRL REUS W/TWL LRG LVL3 (GOWN DISPOSABLE) ×4
MESH ULTRAPRO 3X6 7.6X15CM (Mesh General) ×1 IMPLANT
NDL HYPO 25X1 1.5 SAFETY (NEEDLE) ×1 IMPLANT
NEEDLE HYPO 25X1 1.5 SAFETY (NEEDLE) ×2 IMPLANT
NS IRRIG 1000ML POUR BTL (IV SOLUTION) ×1 IMPLANT
PACK BASIN DAY SURGERY FS (CUSTOM PROCEDURE TRAY) ×2 IMPLANT
PAD CLEANER CAUTERY TIP 5X5 (MISCELLANEOUS) ×1
PENCIL SMOKE EVACUATOR (MISCELLANEOUS) ×2 IMPLANT
SLEEVE SCD COMPRESS KNEE MED (MISCELLANEOUS) ×1 IMPLANT
SPONGE INTESTINAL PEANUT (DISPOSABLE) IMPLANT
SPONGE LAP 18X18 RF (DISPOSABLE) ×2 IMPLANT
STRIP CLOSURE SKIN 1/2X4 (GAUZE/BANDAGES/DRESSINGS) IMPLANT
SUT CHROMIC 2 0 SH (SUTURE) IMPLANT
SUT MNCRL AB 4-0 PS2 18 (SUTURE) ×2 IMPLANT
SUT MON AB 5-0 PS2 18 (SUTURE) IMPLANT
SUT NOVA 0 T19/GS 22DT (SUTURE) ×5 IMPLANT
SUT VIC AB 0 SH 27 (SUTURE) IMPLANT
SUT VIC AB 2-0 SH 27 (SUTURE) ×2
SUT VIC AB 2-0 SH 27XBRD (SUTURE) IMPLANT
SUT VICRYL 3-0 CR8 SH (SUTURE) ×2 IMPLANT
SUT VICRYL AB 2 0 TIE (SUTURE) IMPLANT
SUT VICRYL AB 2 0 TIES (SUTURE)
SYR CONTROL 10ML LL (SYRINGE) ×2 IMPLANT
TOWEL GREEN STERILE FF (TOWEL DISPOSABLE) ×3 IMPLANT
TUBE CONNECTING 20X1/4 (TUBING) ×1 IMPLANT
YANKAUER SUCT BULB TIP NO VENT (SUCTIONS) ×1 IMPLANT

## 2020-07-13 NOTE — Anesthesia Procedure Notes (Signed)
Procedure Name: LMA Insertion Date/Time: 07/13/2020 12:40 PM Performed by: Garth Bigness, CRNA Pre-anesthesia Checklist: Timeout performed, Patient being monitored, Emergency Drugs available and Patient identified Patient Re-evaluated:Patient Re-evaluated prior to induction Oxygen Delivery Method: Circle system utilized Preoxygenation: Pre-oxygenation with 100% oxygen Induction Type: IV induction Ventilation: Mask ventilation without difficulty LMA: LMA inserted LMA Size: 4.0 Number of attempts: 1 Placement Confirmation: positive ETCO2 Tube secured with: Tape

## 2020-07-13 NOTE — Interval H&P Note (Signed)
History and Physical Interval Note:  07/13/2020 4:50 PM  Randall Torres  has presented today for surgery, with the diagnosis of RECURRENT RIGHT INGUINAL HERNIA.  The various methods of treatment have been discussed with the patient and family.   His wife is here with him today.  After consideration of risks, benefits and other options for treatment, the patient has consented to  Procedure(s): RIGHT INGUINAL HERNIA REPAIR (Right) INSERTION OF MESH (Right) as a surgical intervention.  The patient's history has been reviewed, patient examined, no change in status, stable for surgery.  I have reviewed the patient's chart and labs.  Questions were answered to the patient's satisfaction.     Kandis Cocking

## 2020-07-13 NOTE — Transfer of Care (Signed)
Immediate Anesthesia Transfer of Care Note  Patient: Randall Torres  Procedure(s) Performed: RIGHT INGUINAL HERNIA REPAIR (Right Groin) INSERTION OF MESH (Right Groin)  Patient Location: PACU  Anesthesia Type:General  Level of Consciousness: awake, alert , oriented and patient cooperative  Airway & Oxygen Therapy: Patient Spontanous Breathing and Patient connected to face mask oxygen  Post-op Assessment: Report given to RN and Post -op Vital signs reviewed and stable  Post vital signs: Reviewed and stable  Last Vitals:  Vitals Value Taken Time  BP 136/69 07/13/20 1441  Temp    Pulse 70 07/13/20 1442  Resp 17 07/13/20 1442  SpO2 99 % 07/13/20 1442  Vitals shown include unvalidated device data.  Last Pain:  Vitals:   07/13/20 1112  TempSrc: Oral  PainSc: 0-No pain      Patients Stated Pain Goal: 4 (07/13/20 1112)  Complications: No complications documented.

## 2020-07-13 NOTE — Discharge Instructions (Signed)
Next dose of Tylenol can be given at 5:30pm if needed.   Post Anesthesia Home Care Instructions  Activity: Get plenty of rest for the remainder of the day. A responsible individual must stay with you for 24 hours following the procedure.  For the next 24 hours, DO NOT: -Drive a car -Advertising copywriter -Drink alcoholic beverages -Take any medication unless instructed by your physician -Make any legal decisions or sign important papers.  Meals: Start with liquid foods such as gelatin or soup. Progress to regular foods as tolerated. Avoid greasy, spicy, heavy foods. If nausea and/or vomiting occur, drink only clear liquids until the nausea and/or vomiting subsides. Call your physician if vomiting continues.  Special Instructions/Symptoms: Your throat may feel dry or sore from the anesthesia or the breathing tube placed in your throat during surgery. If this causes discomfort, gargle with warm salt water. The discomfort should disappear within 24 hours.  If you had a scopolamine patch placed behind your ear for the management of post- operative nausea and/or vomiting:  1. The medication in the patch is effective for 72 hours, after which it should be removed.  Wrap patch in a tissue and discard in the trash. Wash hands thoroughly with soap and water. 2. You may remove the patch earlier than 72 hours if you experience unpleasant side effects which may include dry mouth, dizziness or visual disturbances. 3. Avoid touching the patch. Wash your hands with soap and water after contact with the patch.       Information for Discharge Teaching: EXPAREL (bupivacaine liposome injectable suspension)   Your surgeon or anesthesiologist gave you EXPAREL(bupivacaine) to help control your pain after surgery.   EXPAREL is a local anesthetic that provides pain relief by numbing the tissue around the surgical site.  EXPAREL is designed to release pain medication over time and can control pain for up to  72 hours.  Depending on how you respond to EXPAREL, you may require less pain medication during your recovery.  Possible side effects:  Temporary loss of sensation or ability to move in the area where bupivacaine was injected.  Nausea, vomiting, constipation  Rarely, numbness and tingling in your mouth or lips, lightheadedness, or anxiety may occur.  Call your doctor right away if you think you may be experiencing any of these sensations, or if you have other questions regarding possible side effects.  Follow all other discharge instructions given to you by your surgeon or nurse. Eat a healthy diet and drink plenty of water or other fluids.  If you return to the hospital for any reason within 96 hours following the administration of EXPAREL, it is important for health care providers to know that you have received this anesthetic. A teal colored band has been placed on your arm with the date, time and amount of EXPAREL you have received in order to alert and inform your health care providers. Please leave this armband in place for the full 96 hours following administration, and then you may remove the band.    [facility] - DISCHARGE INSTRUCTIONS TO PATIENT  Activity:  Driving - May drive in 2 to 5 days, if doing well and off pain meds   Lifting - No lifting more than 15 pounds for 4 weeks.                       Practice your Covid-19 protection:  Wear a mask, social distance, and wash your hands frequently  Wound  Care:   Leave the incision dry for 2 days, then you may shower  Diet:  As tolerated  Follow up appointment:  Call Dr. Allene Pyo office [facility]) at [phone] for an appointment in 2 to 4 weeks..  Medications and dosages:  Resume your home medications.  You have a prescription for:  Vicodin             You may also take Tylenol, ibuprofen, or Aleve for pain  Call Dr. Ezzard Standing or his office  [phone]) if you have:  Temperature greater  than 100.4,  Persistent nausea and vomiting,  Severe uncontrolled pain,  Redness, tenderness, or signs of infection (pain, swelling, redness, odor or green/yellow discharge around the site),  Difficulty breathing, headache or visual disturbances,  Any other questions or concerns you may have after discharge.  In an emergency, call 911 or go to an Emergency Department at a nearby hospital.

## 2020-07-13 NOTE — Anesthesia Preprocedure Evaluation (Signed)
Anesthesia Evaluation  Patient identified by MRN, date of birth, ID band Patient awake    Reviewed: Allergy & Precautions, NPO status , Patient's Chart, lab work & pertinent test results  Airway Mallampati: II  TM Distance: >3 FB Neck ROM: Full    Dental no notable dental hx.    Pulmonary neg pulmonary ROS,    Pulmonary exam normal breath sounds clear to auscultation       Cardiovascular negative cardio ROS Normal cardiovascular exam Rhythm:Regular Rate:Normal     Neuro/Psych negative neurological ROS  negative psych ROS   GI/Hepatic negative GI ROS, Neg liver ROS,   Endo/Other  negative endocrine ROS  Renal/GU negative Renal ROS  negative genitourinary   Musculoskeletal  (+) Arthritis , Osteoarthritis,    Abdominal   Peds negative pediatric ROS (+)  Hematology negative hematology ROS (+)   Anesthesia Other Findings   Reproductive/Obstetrics negative OB ROS                             Anesthesia Physical Anesthesia Plan  ASA: II  Anesthesia Plan: General   Post-op Pain Management:    Induction: Intravenous  PONV Risk Score and Plan: 2 and Ondansetron, Dexamethasone and Treatment may vary due to age or medical condition  Airway Management Planned: LMA  Additional Equipment:   Intra-op Plan:   Post-operative Plan: Extubation in OR  Informed Consent: I have reviewed the patients History and Physical, chart, labs and discussed the procedure including the risks, benefits and alternatives for the proposed anesthesia with the patient or authorized representative who has indicated his/her understanding and acceptance.     Dental advisory given  Plan Discussed with: CRNA and Surgeon  Anesthesia Plan Comments:         Anesthesia Quick Evaluation

## 2020-07-13 NOTE — Op Note (Addendum)
07/13/2020  2:40 PM  PATIENT:  Randall Torres, 69 y.o., male, MRN: [id number]  PREOP DIAGNOSIS:  RECURRENT RIGHT INGUINAL HERNIA  POSTOP DIAGNOSIS:   Recurrent direct right inguinal hernia.  PROCEDURE:   Procedure(s):  Open RIGHT INGUINAL HERNIA REPAIR, INSERTION OF MESH  SURGEON:   Ovidio Kin, M.D.  ANESTHESIA:   general  Anesthesiologist: Beryle Lathe, MD; Eilene Ghazi, MD CRNA: Garth Bigness, CRNA  General  EBL:  minimal  ml  LOCAL MEDICATIONS USED:   20 cc of Exparel with 20 cc 1/4% marcaine  SPECIMEN:   None  COUNTS CORRECT:  YES  INDICATIONS FOR PROCEDURE:  Randall Torres is a 69 y.o. (DOB: [birth year]) white male whose primary care physician is Panosh, Neta Mends, MD and comes for repair of recurrent right inguinal hernia.  His original inguinal hernia repair was 18 October 2014 when Dr. Derrell Lolling did a laparoscopic bilateral inguinal hernia repair.  At that time he found bilateral indirect inguinal hernias.  He did the repair with a large size Bard 3-D Max mesh.  The patient had noticed an increasing right inguinal bulge consistent with a recurrent right inguinal hernia.   The indications and risks of the hernia surgery were explained to the patient.  The risks include, but are not limited to, infection, bleeding, recurrence of the hernia, and nerve injury.  Operative Note:  The patient was taken to room number 7 at [facility].  He underwent a general anesthesia.     A time out was held and the surgical checklist run.   His lower abdomen was shaved and then prepped with chloroprep.  A right inguinal incision was made through the subcutaneous fat to the external oblique fascia.   The external ring was opened and the cord structures encircled with a penrose drain.  The patient has a lateral direct inguinal hernia that came out through the lateral inguinal floor, just medial to the internal ring.  This is a little odd location for a recurrent hernia from a  prior laparoscopic repair.  I identified the ileo inguinal nerve and preserved this during the dissection.  I imbricated the hernia sac with a 2-0 vicryl suture.  I could find no evidence of an indirect inguinal hernia.  By the way the inguinal floor felt, I wondered if the prior mesh had migrated cephalad.   The inguinal floor was repaired with a 3 x 6 inch piece of Ultrapro mesh.  The mesh was cut to fit the inguinal floor.  The mesh was sewn in place with interrupted 0 Novafil suture.  I sewed the mesh medially to the pubic tubercle, inferiorly to the shelving edge of the inguinal ligament, and superiorly to the transversalis fascia.   A key hole was made for the internal ring.  The mesh lay flat.  The inguinal floor was covered and the internal ring recreated.   The cord structures were returned to a normal location.  The external oblique was closed with a 3-0 vicryl.  The fascia and subcutaneous tissues were infiltrated with 40 cc of the Exparel and 1/4% marcaine mixture.   The skin was closed with 4-0 monocryl and painted with DermaBond. The sponge and needle count were correct at the end of the case.  The patient was transported to the recovery room in good condition.   The patient will go home today.  Discharge instructions reviewed.  Ovidio Kin, MD, [facility] Pager: [phone] Office phone:  [phone]

## 2020-07-13 NOTE — Anesthesia Postprocedure Evaluation (Signed)
Anesthesia Post Note  Patient: BLESSED COTHAM  Procedure(s) Performed: RIGHT INGUINAL HERNIA REPAIR (Right Groin) INSERTION OF MESH (Right Groin)     Patient location during evaluation: PACU Anesthesia Type: General Level of consciousness: awake and alert Pain management: pain level controlled Vital Signs Assessment: post-procedure vital signs reviewed and stable Respiratory status: spontaneous breathing, nonlabored ventilation and respiratory function stable Cardiovascular status: blood pressure returned to baseline and stable Postop Assessment: no apparent nausea or vomiting Anesthetic complications: no   No complications documented.  Last Vitals:  Vitals:   07/13/20 1512 07/13/20 1515  BP:  124/69  Pulse: 67 (!) 59  Resp: 15 10  Temp:    SpO2: 94% 95%    Last Pain:  Vitals:   07/13/20 1515  TempSrc:   PainSc: 0-No pain                 Beryle Lathe

## 2020-07-14 ENCOUNTER — Encounter (HOSPITAL_BASED_OUTPATIENT_CLINIC_OR_DEPARTMENT_OTHER): Payer: Self-pay | Admitting: Surgery

## 2020-08-18 ENCOUNTER — Other Ambulatory Visit: Payer: Self-pay | Admitting: Internal Medicine

## 2020-10-06 ENCOUNTER — Emergency Department (HOSPITAL_COMMUNITY): Payer: Federal, State, Local not specified - PPO

## 2020-10-06 ENCOUNTER — Emergency Department (HOSPITAL_COMMUNITY)
Admission: EM | Admit: 2020-10-06 | Discharge: 2020-10-06 | Disposition: A | Discharge status: Home / Self Care | Payer: Federal, State, Local not specified - PPO | Attending: Emergency Medicine | Admitting: Emergency Medicine

## 2020-10-06 ENCOUNTER — Other Ambulatory Visit: Payer: Self-pay

## 2020-10-06 DIAGNOSIS — U071 COVID-19: Secondary | ICD-10-CM | POA: Insufficient documentation

## 2020-10-06 DIAGNOSIS — J029 Acute pharyngitis, unspecified: Secondary | ICD-10-CM

## 2020-10-06 DIAGNOSIS — R0602 Shortness of breath: Secondary | ICD-10-CM | POA: Diagnosis present

## 2020-10-06 MED ORDER — SODIUM CHLORIDE 0.9 % IV BOLUS
1000.0000 mL | Freq: Once | INTRAVENOUS | Status: AC
  Administered 2020-10-06: 1000 mL via INTRAVENOUS

## 2020-10-06 MED ORDER — LIDOCAINE VISCOUS HCL 2 % MT SOLN: 2.0000 mL | OROMUCOSAL | 0 refills | Status: DC | PRN

## 2020-10-06 MED ORDER — ACETAMINOPHEN 325 MG PO TABS
650.0000 mg | ORAL_TABLET | Freq: Once | ORAL | Status: AC | PRN
  Administered 2020-10-06: 650 mg via ORAL
  Filled 2020-10-06: qty 2

## 2020-10-06 MED ORDER — DEXAMETHASONE SODIUM PHOSPHATE 10 MG/ML IJ SOLN
10.0000 mg | Freq: Once | INTRAMUSCULAR | Status: AC
  Administered 2020-10-06: 10 mg via INTRAVENOUS
  Filled 2020-10-06: qty 1

## 2020-10-06 NOTE — ED Provider Notes (Signed)
[facility] EMERGENCY DEPARTMENT Provider Note   CSN: [id number] Arrival date & time: 10/06/20  1547     History Chief Complaint  Patient presents with  . Shortness of Breath    [facility] is a 70 y.o. male with PMH/o ADD, BPH, HLD who presents for evaluation of body aches, decreased appetite, cough, SOB that began yesterday. Patient reports that he started feeling fatigued and tired. He then started having cough. He took an at home COVID 19 test and noted it to be positive.  He comes in today because he feels like his symptoms are worsening.  He feels like his throat is swollen/irritated and painful which is making it hard for him to breathe.  He states that he can tolerate his saliva.  He reports that swallowing other things is hard and painful.  He states he has a hoarse voice.  He states his cough is getting worse.  It is not productive.  He has had fevers at home.  He does not smoke.  No history of COPD or asthma.  He does report that he has been vaccinated x3.  The history is provided by the patient.       Past Medical History:  Diagnosis Date  . ADD (attention deficit disorder)   . ADJUSTMENT DISORDER 09/29/2007   Qualifier: Diagnosis of  By: Fabian Sharp MD, Neta Mends   . BPH (benign prostatic hyperplasia)    has seen urologist  . Hyperlipidemia   . Inguinal hernia    bilateral  . Personal history of colonic adenoma 06/30/2008  . Rhinitis 12/31/2010    Patient Active Problem List   Diagnosis Date Noted  . Decreased hearing 05/25/2018  . B12 deficiency 09/03/2017  . Foraminal stenosis of lumbosacral region 05/16/2016  . Neuropathy 05/03/2016  . BPH (benign prostatic hyperplasia)   . Visit for preventive health examination 12/31/2010  . 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 Personal history of colonic adenoma 06/30/2008  . HYPERLIPIDEMIA 09/29/2007  . ERECTILE DYSFUNCTION 09/29/2007  . ADD 09/29/2007  . DEGENERATIVE JOINT DISEASE, LEFT  KNEE 09/29/2007    Past Surgical History:  Procedure Laterality Date  . CYSTOSCOPY    . INGUINAL HERNIA REPAIR Bilateral 10/18/2014   Procedure: LAPAROSCOPIC BILATERAL INGUINAL HERNIA REPAIR  ;  Surgeon: Claud Kelp, MD;  Location: [facility];  Service: General;  Laterality: Bilateral;  . INGUINAL HERNIA REPAIR Right 07/13/2020   Procedure: RIGHT INGUINAL HERNIA REPAIR;  Surgeon: Ovidio Kin, MD;  Location: [facility];  Service: General;  Laterality: Right;  . INSERTION OF MESH Bilateral 10/18/2014   Procedure: INSERTION OF MESH;  Surgeon: Claud Kelp, MD;  Location: [facility];  Service: General;  Laterality: Bilateral;  . INSERTION OF MESH Right 07/13/2020   Procedure: INSERTION OF MESH;  Surgeon: Ovidio Kin, MD;  Location:  SURGERY CENTER;  Service: General;  Laterality: Right;  . KNEE ARTHROSCOPY    . TOTAL KNEE ARTHROPLASTY  11/08   medial compartmental        Family History  Problem Relation Age of Onset  . Diabetes Other   . Other Other        CVA age 58  . Other Other        sleep problems  . Myasthenia gravis Brother 32       deceased  . Hyperlipidemia Other   . Alcohol abuse Other        Uncle  . Other Mother   .  Stroke Father   . Diabetes Father   . Throat cancer Sister   . Breast cancer Sister   . Healthy Son   . Neuropathy Neg Hx     Social History   Tobacco Use  . Smoking status: Never Smoker  . Smokeless tobacco: Never Used  Vaping Use  . Vaping Use: Never used  Substance Use Topics  . Alcohol use: No    Alcohol/week: 0.0 standard drinks    Comment: Hx of alcohol abuse - none since 2010  . Drug use: No    Home Medications Prior to Admission medications   Medication Sig Start Date End Date Taking? Authorizing Provider  lidocaine (XYLOCAINE) 2 % solution Use as directed 2 mLs in the mouth or throat as needed for mouth pain. 10/06/20  Yes Maxwell CaulLayden, Yamari Ventola A, PA-C  clonazePAM (KLONOPIN) 0.5 MG tablet  04/30/17   [provider]  cyanocobalamin 1000 MCG tablet Take 1,000 mcg by mouth daily.    [provider]  HYDROcodone-acetaminophen (NORCO/VICODIN) 5-325 MG tablet Take 1 tablet by mouth every 6 (six) hours as needed for moderate pain. 07/13/20   Ovidio KinNewman, David, MD  NON FORMULARY "flomax" per patient    [provider]  sertraline (ZOLOFT) 100 MG tablet Take 100 mg by mouth daily.    [provider]  simvastatin (ZOCOR) 40 MG tablet Take 1 tablet (40 mg total) by mouth daily at 6 PM. 08/21/20   Panosh, Neta MendsWanda K, MD  tamsulosin (FLOMAX) 0.4 MG CAPS capsule Take 1 capsule (0.4 mg total) by mouth daily after breakfast. 07/12/20   Panosh, Neta MendsWanda K, MD  vitamin B-12 (CYANOCOBALAMIN) 1000 MCG tablet Take 1,000 mcg by mouth daily.    [provider]    Allergies    Patient has no known allergies.  Review of Systems   Review of Systems  Constitutional: Positive for activity change, appetite change, fatigue and fever.  HENT: Positive for sore throat.   Respiratory: Positive for cough and shortness of breath.   Cardiovascular: Negative for chest pain.  Gastrointestinal: Negative for abdominal pain, nausea and vomiting.  Genitourinary: Negative for dysuria and hematuria.  Neurological: Negative for headaches.  All other systems reviewed and are negative.   Physical Exam Updated Vital Signs BP (!) 150/95   Pulse 92   Temp (!) 103 F (39.4 C) (Oral)   Resp (!) 24   Ht 5\' 9"  (1.753 m)   Wt 78.5 kg   SpO2 99%   BMI 25.55 kg/m   Physical Exam Vitals and nursing note reviewed.  Constitutional:      Appearance: Normal appearance. He is well-developed and well-nourished.     Comments: NAD  HENT:     Head: Normocephalic and atraumatic.     Mouth/Throat:     Mouth: Oropharynx is clear and moist and mucous membranes are normal.     Comments: Posterior pharynx is erythematous.  No edema.  Uvula is midline.  Airways patent.  Hoarse phonation.  No muffled  phonation. Eyes:     General: Lids are normal.     Extraocular Movements: EOM normal.     Conjunctiva/sclera: Conjunctivae normal.     Pupils: Pupils are equal, round, and reactive to light.  Cardiovascular:     Rate and Rhythm: Normal rate and regular rhythm.     Pulses: Normal pulses.     Heart sounds: Normal heart sounds. No murmur heard. No friction rub. No gallop.   Pulmonary:  Effort: Pulmonary effort is normal.     Breath sounds: Normal breath sounds.     Comments: Lungs clear to auscultation bilaterally.  Symmetric chest rise.  No wheezing, rales, rhonchi. Able to speak in full sentences without any difficulty.  Abdominal:     Palpations: Abdomen is soft. Abdomen is not rigid.     Tenderness: There is no abdominal tenderness. There is no guarding.  Musculoskeletal:        General: Normal range of motion.     Cervical back: Full passive range of motion without pain.  Skin:    General: Skin is warm and dry.     Capillary Refill: Capillary refill takes less than 2 seconds.  Neurological:     Mental Status: He is alert and oriented to person, place, and time.  Psychiatric:        Mood and Affect: Mood and affect normal.        Speech: Speech normal.     ED Results / Procedures / Treatments   Labs (all labs ordered are listed, but only abnormal results are displayed) Labs Reviewed - No data to display  EKG EKG Interpretation  Date/Time:  Friday October 06 2020 15:51:56 EST Ventricular Rate:  94 PR Interval:  128 QRS Duration: 90 QT Interval:  334 QTC Calculation: 417 R Axis:   64 Text Interpretation: Normal sinus rhythm Septal infarct , age undetermined No significant change since last tracing Confirmed by Gwyneth Sprout ([id number]) on 10/06/2020 7:35:23 PM   Radiology DG Chest Port 1 View  Result Date: 10/06/2020 CLINICAL DATA:  Sore throat, short of breath, COVID-19 positive EXAM: PORTABLE CHEST 1 VIEW COMPARISON:  12/31/2010 FINDINGS: The heart size and  mediastinal contours are within normal limits. Both lungs are clear. The visualized skeletal structures are unremarkable. IMPRESSION: No active disease. Electronically Signed   By: Sharlet Salina M.D.   On: 10/06/2020 17:37    Procedures Procedures (including critical care time)  Medications Ordered in ED Medications  sodium chloride 0.9 % bolus 1,000 mL (has no administration in time range)  dexamethasone (DECADRON) injection 10 mg (has no administration in time range)  acetaminophen (TYLENOL) tablet 650 mg (650 mg Oral Given 10/06/20 1600)    ED Course  I have reviewed the triage vital signs and the nursing notes.  Pertinent labs & imaging results that were available during my care of the patient were reviewed by me and considered in my medical decision making (see chart for details).    MDM Rules/Calculators/A&P                          70 year old male who presents for evaluation of cough, shortness of breath, body aches, fatigue yesterday.  He took a home COVID test which was positive.  Comes in today because he felt like symptoms were getting worse.  He felt like his throat was getting swollen, irritated and is making it hard to breathe.  On initial arrival, he is febrile, hypertensive, tachypneic.  Vitals otherwise stable.  No evidence of hypoxia.  He is overall well-appearing.  He is intermittently coughing but no evidence of respiratory distress.  On posterior oropharynx exam, it is slightly erythematous but no edema.  Uvula is midline.  He has hoarse phonation but no muffled phonation.  No evidence of peritonsillar abscess.  History/physical exam not concerning for Ludwig angina.  Suspect this is ongoing symptoms of COVID-19.  Chest x-ray ordered at triage.  CXR  shows no evidence of pneumonia.   Patient is without hypoxia. He has been referred to the COVID treatment center. Will give him viscous lidocaine to go home with. At this time, patient exhibits no emergent life-threatening  condition that require further evaluation in ED. Patient had ample opportunity for questions and discussion. All patient's questions were answered with full understanding. Strict return precautions discussed. Patient expresses understanding and agreement to plan.   Randall Torres was evaluated in Emergency Department on 10/06/2020 for the symptoms described in the history of present illness. He was evaluated in the context of the global COVID-19 pandemic, which necessitated consideration that the patient might be at risk for infection with the SARS-CoV-2 virus that causes COVID-19. Institutional protocols and algorithms that pertain to the evaluation of patients at risk for COVID-19 are in a state of rapid change based on information released by regulatory bodies including the CDC and federal and state organizations. These policies and algorithms were followed during the patient's care in the ED.  Portions of this note were generated with Scientist, clinical (histocompatibility and immunogenetics). Dictation errors may occur despite best attempts at proofreading.   Final Clinical Impression(s) / ED Diagnoses Final diagnoses:  COVID-19  Sore throat    Rx / DC Orders ED Discharge Orders         Ordered    Ambulatory referral for Covid Treatment        10/06/20 2137    lidocaine (XYLOCAINE) 2 % solution  As needed        10/06/20 2139           Rosana Hoes 10/06/20 2148    Gwyneth Sprout, MD 10/06/20 [phone]

## 2020-10-06 NOTE — Discharge Instructions (Addendum)
Your chest XR did not show any pneumonia.   You can take Tylenol or Ibuprofen as directed for pain. You can alternate Tylenol and Ibuprofen every 4 hours. If you take Tylenol at 1pm, then you can take Ibuprofen at 5pm. Then you can take Tylenol again at 9pm.   Make sure you are drinking plenty of fluids.  Use viscous lidocaine as directed.  I have referred you to the COVID care treatment center. They will notify you if you are eligible for treatment.   Return to the Emergency Dept for any difficulty breathing, vomiting/inability to keep any food or liquid downs, chest pain or any other worsening or concerning symptoms.

## 2020-10-06 NOTE — ED Triage Notes (Signed)
Patient complains of sore throat, shortness of breath, generalized body aches that started yesterday. States he took a home COVID test that was positive last night, patient is vaccinated against COVID with booster.

## 2020-10-08 ENCOUNTER — Other Ambulatory Visit: Payer: Self-pay | Admitting: Adult Health

## 2020-10-08 ENCOUNTER — Telehealth: Payer: Self-pay

## 2020-10-08 MED ORDER — MOLNUPIRAVIR EUA 200MG CAPSULE: 4.0000 | ORAL_CAPSULE | Freq: Two times a day (BID) | ORAL | 0 refills | Status: AC

## 2020-10-08 NOTE — Telephone Encounter (Signed)
Patient called about Lidocaine script, it was sent to a pharmacy that is closed. Called it in to [facility]. Will cancel the first one.Patgient informed of switch

## 2020-10-08 NOTE — Progress Notes (Signed)
Outpatient Oral COVID Treatment Note  I connected with Randall Torres on 10/08/2020/5:09 PM by telephone and verified that I am speaking with the correct person using two identifiers.  I discussed the limitations, risks, security, and privacy concerns of performing an evaluation and management service by telephone and the availability of in person appointments. I also discussed with the patient that there may be a patient responsible charge related to this service. The patient expressed understanding and agreed to proceed.  Patient location: Home  Provider location: Home Office   Diagnosis: COVID-19 infection  Purpose of visit: Discussion of potential use of Molnupiravir or Paxlovid, a new treatment for mild to moderate COVID-19 viral infection in non-hospitalized patients.   Subjective: Patient is a 70 y.o. male who has been diagnosed with COVID 19 viral infection.  Their symptoms began on 10/05/20  with Cough/congestion .    Past Medical History:  Diagnosis Date  . ADD (attention deficit disorder)   . ADJUSTMENT DISORDER 09/29/2007   Qualifier: Diagnosis of  By: Fabian Sharp MD, Neta Mends   . BPH (benign prostatic hyperplasia)    has seen urologist  . Hyperlipidemia   . Inguinal hernia    bilateral  . Personal history of colonic adenoma 06/30/2008  . Rhinitis 12/31/2010    No Known Allergies   Current Outpatient Medications:  .  clonazePAM (KLONOPIN) 0.5 MG tablet, , Disp: , Rfl: 0 .  cyanocobalamin 1000 MCG tablet, Take 1,000 mcg by mouth daily., Disp: , Rfl:  .  HYDROcodone-acetaminophen (NORCO/VICODIN) 5-325 MG tablet, Take 1 tablet by mouth every 6 (six) hours as needed for moderate pain., Disp: 15 tablet, Rfl: 0 .  lidocaine (XYLOCAINE) 2 % solution, Use as directed 2 mLs in the mouth or throat as needed for mouth pain., Disp: 15 mL, Rfl: 0 .  NON FORMULARY, "flomax" per patient, Disp: , Rfl:  .  sertraline (ZOLOFT) 100 MG tablet, Take 100 mg by mouth daily., Disp: , Rfl:  .   simvastatin (ZOCOR) 40 MG tablet, Take 1 tablet (40 mg total) by mouth daily at 6 PM., Disp: 90 tablet, Rfl: 3 .  tamsulosin (FLOMAX) 0.4 MG CAPS capsule, Take 1 capsule (0.4 mg total) by mouth daily after breakfast., Disp: 30 capsule, Rfl: 3 .  vitamin B-12 (CYANOCOBALAMIN) 1000 MCG tablet, Take 1,000 mcg by mouth daily., Disp: , Rfl:   Current Facility-Administered Medications:  .  cyanocobalamin ((VITAMIN B-12)) injection 1,000 mcg, 1,000 mcg, Intramuscular, Once, Patel, Donika K, DO  Objective: Patient appears/sounds okay w/ no audible wheezing.  They are in no apparent distress.  Mood and behavior are normal.  Laboratory Data:  No results found for this or any previous visit (from the past 2160 hour(s)).   Assessment: 70 y.o. male with mild/moderate COVID 19 viral infection diagnosed on 10/06/20  at high risk for progression to severe COVID 19.  Plan:  This patient is a 70 y.o. male that meets the following criteria for Emergency Use Authorization of: Molnupiravir  1. Age >18 yr 2. SARS-COV-2 positive test 3. Symptom onset < 5 days 4. Mild-to-moderate COVID disease with high risk for severe progression to hospitalization or death   I have spoken and communicated the following to the patient or parent/caregiver regarding: 1. Molnupiravir is an unapproved drug that is authorized for use under an TEFL teacher.  2. There are no adequate, approved, available products for the treatment of COVID-19 in adults who have mild-to-moderate COVID-19 and are at high risk for progressing  to severe COVID-19, including hospitalization or death. 3. Other therapeutics are currently authorized. For additional information on all products authorized for treatment or prevention of COVID-19, please see https://www.graham-miller.com/.  4. There are benefits and risks of taking this treatment as outlined in  the "Fact Sheet for Patients and Caregivers."  5. "Fact Sheet for Patients and Caregivers" was reviewed with patient. A hard copy will be provided to patient from pharmacy prior to the patient receiving treatment. 6. Patients should continue to self-isolate and use infection control measures (e.g., wear mask, isolate, social distance, avoid sharing personal items, clean and disinfect "high touch" surfaces, and frequent handwashing) according to CDC guidelines.  7. The patient or parent/caregiver has the option to accept or refuse treatment. 8. [employer] has established a pregnancy surveillance program. 9. Females of childbearing potential should use a reliable method of contraception correctly and consistently, as applicable, for the duration of treatment and for 4 days after the last dose of Molnupiravir. 10. Males of reproductive potential who are sexually active with females of childbearing potential should use a reliable method of contraception correctly and consistently during treatment and for at least 3 months after the last dose. 11. Pregnancy status and risk was assessed. Patient verbalized understanding of precautions.   After reviewing above information with the patient, the patient agrees to receive molnupiravir.  Follow up instructions:    . Take prescription BID x 5 days as directed . Reach out to pharmacist for counseling on medication if desired . For concerns regarding further COVID symptoms please follow up with your PCP or urgent care . For urgent or life-threatening issues, seek care at your local emergency department  The patient was provided an opportunity to ask questions, and all were answered. The patient agreed with the plan and demonstrated an understanding of the instructions.   Script sent to [facility] and opted to pick up RX.  The patient was advised to call their PCP or seek an in-person evaluation if the symptoms worsen or if the  condition fails to improve as anticipated.   I provided 22 minutes of non face-to-face telephone visit time during this encounter, and > 50% was spent counseling as documented under my assessment & plan.  Rubye Oaks, NP 10/08/2020 /5:09 PM

## 2020-10-09 ENCOUNTER — Telehealth (HOSPITAL_COMMUNITY): Payer: Self-pay

## 2020-10-09 MED FILL — MOLNUPIRAVIR 200 MG CAPS: 200 | 5 days supply | Qty: 40 | Fill #0

## 2020-10-09 NOTE — Telephone Encounter (Signed)
Patient was prescribed oral covid treatment Molnupiravir and treatment note was reviewed. Medication has been received by [facility] and reviewed for appropriateness.  Drug Interactions or Dosage Adjustments Noted: none  Delivery Method: pick up  Patient contacted for counseling on 10/09/2020 and verbalized understanding.   Delivery or Pick-Up Date: 10/09/2020   Kirstie Peri 10/09/2020, 8:35 AM [facility] Out[facility] Pharmacist Phone# [phone]

## 2020-10-10 ENCOUNTER — Telehealth: Payer: Self-pay

## 2020-10-10 NOTE — Telephone Encounter (Signed)
So suppose this message  Is a second thread   And first was about  His dx of covid  Agree taking the medication   Can be helpful if given early. Glad you are doing ok .  Make a fu virtual  Either Thursday this week or  Next week depending on how doing

## 2020-10-10 NOTE — Telephone Encounter (Signed)
[facility] pt verified DOB. Pt reqports feeling better. Pt states started RX yesterday for 5 day course. Pt states if concerns during treatment he will call. Pt states if problems after treatment he will call for appt.

## 2020-10-11 NOTE — Telephone Encounter (Signed)
Please note I have already answered this message  Do not understand why there is a second message saying the same thing.  I see that you have left a message for him to make another virtual appointment on Thursday this week or next week anytime

## 2020-10-12 ENCOUNTER — Telehealth (INDEPENDENT_AMBULATORY_CARE_PROVIDER_SITE_OTHER): Payer: Federal, State, Local not specified - PPO | Admitting: Internal Medicine

## 2020-10-12 ENCOUNTER — Other Ambulatory Visit: Payer: Self-pay

## 2020-10-12 ENCOUNTER — Encounter: Payer: Self-pay | Admitting: Internal Medicine

## 2020-10-12 VITALS — Ht 69.0 in | Wt 171.0 lb

## 2020-10-12 DIAGNOSIS — U071 COVID-19: Secondary | ICD-10-CM | POA: Diagnosis not present

## 2020-10-12 NOTE — Progress Notes (Signed)
Virtual Visit via Video Note  I connected with@ on 10/12/20 at  8:30 AM EST by a video enabled telemedicine application and verified that I am speaking with the correct person using two identifiers. Location patient: home Location provider: home office Persons participating in the virtual visit: patient, provider and spouse Randall Torres( also my patient)  WIth national recommendations  regarding COVID 19 pandemic   video visit is advised over in office visit for this patient.  Patient aware  of the limitations of evaluation and management by telemedicine and  availability of in person appointments. and agreed to proceed.   HPI: Randall Torres presents for video visit for fu ed visit Jan 21 22 for covid 19 infection presented as high fever  St and malaise  Sob hoarse  but no resp compromise , neg  Ray hypoxia given decadron in ed and send to infusion clinic  .  Was given molnupiravir  5 day rx  Beginning 1 23  And doing well without  noted se. At this time feel well enough to do his walking  Out side without  Problems. ( does reg exercise) Wife became sick 4 days ago and with runny nose fatigue but no fever. ( home test positive) doesn't feel that sick  She is immunized .     ROS: See pertinent positives and negatives per HPI.  Past Medical History:  Diagnosis Date  . ADD (attention deficit disorder)   . ADJUSTMENT DISORDER 09/29/2007   Qualifier: Diagnosis of  By: Fabian Sharp MD, Neta Mends   . BPH (benign prostatic hyperplasia)    has seen urologist  . Hyperlipidemia   . Inguinal hernia    bilateral  . Personal history of colonic adenoma 06/30/2008  . Rhinitis 12/31/2010    Past Surgical History:  Procedure Laterality Date  . CYSTOSCOPY    . INGUINAL HERNIA REPAIR Bilateral 10/18/2014   Procedure: LAPAROSCOPIC BILATERAL INGUINAL HERNIA REPAIR  ;  Surgeon: Claud Kelp, MD;  Location: [facility];  Service: General;  Laterality: Bilateral;  . INGUINAL HERNIA REPAIR Right 07/13/2020   Procedure:  RIGHT INGUINAL HERNIA REPAIR;  Surgeon: Ovidio Kin, MD;  Location: [facility];  Service: General;  Laterality: Right;  . INSERTION OF MESH Bilateral 10/18/2014   Procedure: INSERTION OF MESH;  Surgeon: Claud Kelp, MD;  Location: [facility];  Service: General;  Laterality: Bilateral;  . INSERTION OF MESH Right 07/13/2020   Procedure: INSERTION OF MESH;  Surgeon: Ovidio Kin, MD;  Location: [facility];  Service: General;  Laterality: Right;  . KNEE ARTHROSCOPY    . TOTAL KNEE ARTHROPLASTY  11/08   medial compartmental     Family History  Problem Relation Age of Onset  . Diabetes Other   . Other Other        CVA age 41  . Other Other        sleep problems  . Myasthenia gravis Brother 32       deceased  . Hyperlipidemia Other   . Alcohol abuse Other        Uncle  . Other Mother   . Stroke Father   . Diabetes Father   . Throat cancer Sister   . Breast cancer Sister   . Healthy Son   . Neuropathy Neg Hx     Social History   Tobacco Use  . Smoking status: Never Smoker  . Smokeless tobacco: Never Used  Vaping Use  . Vaping Use: Never used  Substance  Use Topics  . Alcohol use: No    Alcohol/week: 0.0 standard drinks    Comment: Hx of alcohol abuse - none since 2010  . Drug use: No      Current Outpatient Medications:  .  clonazePAM (KLONOPIN) 0.5 MG tablet, , Disp: , Rfl: 0 .  cyanocobalamin 1000 MCG tablet, Take 1,000 mcg by mouth daily., Disp: , Rfl:  .  HYDROcodone-acetaminophen (NORCO/VICODIN) 5-325 MG tablet, Take 1 tablet by mouth every 6 (six) hours as needed for moderate pain., Disp: 15 tablet, Rfl: 0 .  lidocaine (XYLOCAINE) 2 % solution, Use as directed 2 mLs in the mouth or throat as needed for mouth pain., Disp: 15 mL, Rfl: 0 .  molnupiravir EUA 200 mg CAPS, Take 4 capsules (800 mg total) by mouth 2 (two) times daily for 5 days., Disp: 40 capsule, Rfl: 0 .  NON FORMULARY, "flomax" per patient, Disp: , Rfl:  .  sertraline  (ZOLOFT) 100 MG tablet, Take 100 mg by mouth daily., Disp: , Rfl:  .  simvastatin (ZOCOR) 40 MG tablet, Take 1 tablet (40 mg total) by mouth daily at 6 PM., Disp: 90 tablet, Rfl: 3 .  tamsulosin (FLOMAX) 0.4 MG CAPS capsule, Take 1 capsule (0.4 mg total) by mouth daily after breakfast., Disp: 30 capsule, Rfl: 3 .  vitamin B-12 (CYANOCOBALAMIN) 1000 MCG tablet, Take 1,000 mcg by mouth daily., Disp: , Rfl:   Current Facility-Administered Medications:  .  cyanocobalamin ((VITAMIN B-12)) injection 1,000 mcg, 1,000 mcg, Intramuscular, Once, Nita Sickle K, DO  EXAM: BP Readings from Last 3 Encounters:  10/06/20 134/75  07/13/20 112/72  07/04/20 130/70    VITALS per patient if applicable:  GENERAL: alert, oriented, appears well and in no acute distress  HEENT: atraumatic, conjunttiva clear, no obvious abnormalities on inspection of external nose and ears  NECK: normal movements of the head and neck  LUNGS: on inspection no signs of respiratory distress, breathing rate appears normal, no obvious gross SOB, gasping or wheezing  CV: no obvious cyanosis  MS: moves all visible extremities without noticeable abnormality  PSYCH/NEURO: pleasant and cooperative, no obvious depression or anxiety, speech and thought processing grossly intact Lab Results  Component Value Date   WBC 7.3 07/04/2020   HGB 13.7 07/04/2020   HCT 40.6 07/04/2020   PLT 275 07/04/2020   GLUCOSE 98 07/04/2020   CHOL 164 07/04/2020   TRIG 57 07/04/2020   HDL 55 07/04/2020   LDLDIRECT 147.2 11/29/2009   LDLCALC 95 07/04/2020   ALT 17 07/04/2020   AST 25 07/04/2020   NA 138 07/04/2020   K 4.2 07/04/2020   CL 101 07/04/2020   CREATININE 0.77 07/04/2020   BUN 25 07/04/2020   CO2 31 07/04/2020   TSH 1.93 07/04/2020   PSA 0.72 07/04/2020   HGBA1C 6.0 06/22/2019    ASSESSMENT AND PLAN:  Discussed the following assessment and plan:    ICD-10-CM   1. COVID-19  U07.1    Much better no fever  Minimal sore  throat     No obv se of medication antivirals . This has been fully explained to the patient, who indicates understanding. 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   Expectant management and discussion of plan and treatment with opportunity to ask questions and all were answered. The patient agreed with the  plan and demonstrated an understanding of the instructions.  record review ed visit etc  Visit and plan  30 minutes  Advised to call back or seek an in-person evaluation if worsening  or having  further concerns . Return if symptoms worsen or fail to improve as expected.  Berniece Andreas, MD

## 2020-11-03 ENCOUNTER — Other Ambulatory Visit: Payer: Self-pay | Admitting: Internal Medicine

## 2021-03-06 ENCOUNTER — Other Ambulatory Visit: Payer: Self-pay

## 2021-03-09 ENCOUNTER — Other Ambulatory Visit: Payer: Self-pay

## 2021-03-11 NOTE — Progress Notes (Signed)
Chief Complaint  Patient presents with   Pre-op Exam     HPI: Patient  Randall Torres  70 y.o. comes in today for pre operative eavaluation for left knee total replacement with Dr. Turner Daniels.  He has had a partial knee replacement years ago but it is progressing with pain and is dysfunction.  Will to walk but begins limping at the end of the day.  He has had no chest pain shortness of breath cardiovascular symptoms No excess bleeding neurologic symptoms. He is under care for overall health issues and stable no change in medicine. He is on simvastatin for hyperlipidemia stable. He has been on B12 for possible neuropathy in the past. Has been treated for BPH.  Use Flomax He had a history of COVID-19 infection January 2022 and took antiviral after requiring ED visit for high fever and illness. He remains alcohol free on sertraline.   Health Maintenance  Topic Date Due   INFLUENZA VACCINE  04/16/2021   COVID-19 Vaccine (4 - Booster for Pfizer series) 05/16/2021   TETANUS/TDAP  12/05/2024   COLONOSCOPY (Pts 45-76yrs Insurance coverage will need to be confirmed)  08/03/2028   Hepatitis C Screening  Completed   PNA vac Low Risk Adult  Completed   Zoster Vaccines- Shingrix  Completed   HPV VACCINES  Aged Out   Health Maintenance Review LIFESTYLE:  Exercise: Yes walks miles Tobacco/ETS: No Alcohol: No Retired.   ROS:  GEN/ HEENT: No fever, significant weight changes sweats headaches vision problems hearing changes, CV/ PULM; No chest pain shortness of breath cough, syncope,edema  change in exercise tolerance. GI /GU: No adominal pain, vomiting, change in bowel habits. No blood in the stool. No significant GU symptoms. SKIN/HEME: ,no acute skin rashes suspicious lesions or bleeding. No lymphadenopathy, nodules, masses.  NEURO/ PSYCH:  No neurologic signs such as weakness numbness.  IMM/ Allergy: No unusual infections.  Allergy .   REST of 12 system review negative except as per  HPI   Past Medical History:  Diagnosis Date   ADD (attention deficit disorder)    ADJUSTMENT DISORDER 09/29/2007   Qualifier: Diagnosis of  By: Fabian Sharp MD, Randall Torres    BPH (benign prostatic hyperplasia)    has seen urologist   Hyperlipidemia    Inguinal hernia    bilateral   Personal history of colonic adenoma 06/30/2008   Personal history of COVID-04 October 2020   Rhinitis 12/31/2010    Past Surgical History:  Procedure Laterality Date   CYSTOSCOPY     INGUINAL HERNIA REPAIR Bilateral 10/18/2014   Procedure: LAPAROSCOPIC BILATERAL INGUINAL HERNIA REPAIR  ;  Surgeon: Claud Kelp, MD;  Location: [facility];  Service: General;  Laterality: Bilateral;   INGUINAL HERNIA REPAIR Right 07/13/2020   Procedure: RIGHT INGUINAL HERNIA REPAIR;  Surgeon: Ovidio Kin, MD;  Location: [facility];  Service: General;  Laterality: Right;   INSERTION OF MESH Bilateral 10/18/2014   Procedure: INSERTION OF MESH;  Surgeon: Claud Kelp, MD;  Location: [facility];  Service: General;  Laterality: Bilateral;   INSERTION OF MESH Right 07/13/2020   Procedure: INSERTION OF MESH;  Surgeon: Ovidio Kin, MD;  Location: [facility];  Service: General;  Laterality: Right;   KNEE ARTHROSCOPY     TOTAL KNEE ARTHROPLASTY  11/08   medial compartmental     Family History  Problem Relation Age of Onset   Diabetes Other    Other Other  CVA age 72   Other Other        sleep problems   Myasthenia gravis Brother 32       deceased   Hyperlipidemia Other    Alcohol abuse Other        Uncle   Other Mother    Stroke Father    Diabetes Father    Throat cancer Sister    Breast cancer Sister    Healthy Son    Neuropathy Neg Hx     Social History   Socioeconomic History   Marital status: Married    Spouse name: Not on file   Number of children: Not on file   Years of education: Not on file   Highest education level: Not on file  Occupational History   Not on file   Tobacco Use   Smoking status: Never   Smokeless tobacco: Never  Vaping Use   Vaping Use: Never used  Substance and Sexual Activity   Alcohol use: No    Alcohol/week: 0.0 standard drinks    Comment: Hx of alcohol abuse - none since 2010   Drug use: No   Sexual activity: Not on file  Other Topics Concern   Not on file  Social History Narrative   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    Goes to AA  5 day per week or so.      HH of 2 dog     Wife x for breast cancer    2 children.   Lives with wife in a 2 story home.    Social Determinants of Health   Financial Resource Strain: Not on file  Food Insecurity: Not on file  Transportation Needs: Not on file  Physical Activity: Not on file  Stress: Not on file  Social Connections: Not on file    Outpatient Medications Prior to Visit  Medication Sig Dispense Refill   clonazePAM (KLONOPIN) 0.5 MG tablet   0   cyanocobalamin 1000 MCG tablet Take 1,000 mcg by mouth daily.     DULoxetine (CYMBALTA) 60 MG capsule Take 60 mg by mouth daily.     lidocaine (XYLOCAINE) 2 % solution Use as directed 2 mLs in the mouth or throat as needed for mouth pain. 15 mL 0   NON FORMULARY "flomax" per patient     sertraline (ZOLOFT) 100 MG tablet Take 100 mg by mouth daily.     simvastatin (ZOCOR) 40 MG tablet Take 1 tablet (40 mg total) by mouth daily at 6 PM. 90 tablet 3   tamsulosin (FLOMAX) 0.4 MG CAPS capsule TAKE 1 CAPSULE(0.4 MG) BY MOUTH DAILY AFTER BREAKFAST 30 capsule 3   vitamin B-12 (CYANOCOBALAMIN) 1000 MCG tablet Take 1,000 mcg by mouth daily.     HYDROcodone-acetaminophen (NORCO/VICODIN) 5-325 MG tablet Take 1 tablet by mouth every 6 (six) hours as needed for moderate pain. 15 tablet 0   Molnupiravir 200 MG CAPS TAKE 4 CAPSULES (800 MG TOTAL) BY MOUTH 2 (TWO) TIMES DAILY FOR 5 DAYS. 40 capsule 0   Facility-Administered Medications  Prior to Visit  Medication Dose Route Frequency Provider Last Rate Last Admin   cyanocobalamin ((VITAMIN B-12)) injection 1,000 mcg  1,000 mcg Intramuscular Once Patel, Donika K, DO         EXAM:  BP 126/70 (BP Location: Left Arm, Patient Position: Sitting, Cuff Size: Normal)  Pulse 72   Temp 98.1 F (36.7 C) (Oral)   Ht 5\' 9"  (1.753 m)   Wt 172 lb 3.2 oz (78.1 kg)   SpO2 95%   BMI 25.43 kg/m   Body mass index is 25.43 kg/m. Wt Readings from Last 3 Encounters:  03/12/21 172 lb 3.2 oz (78.1 kg)  10/12/20 171 lb (77.6 kg)  10/06/20 173 lb (78.5 kg)    Physical Exam: Vital signs reviewed 10/08/20 is a well-developed well-nourished alert cooperative    who appearsr stated age in no acute distress.  HEENT: normocephalic atraumatic , Eyes: PERRL EOM's full, conjunctiva clear, Nares: paten,t no deformity discharge or tenderness., Ears: no deformity hearing assistance mouth: masked  NECK: supple without masses, thyromegaly or bruits. CHEST/PULM:  Clear to auscultation and percussion breath sounds equal no wheeze , rales or rhonchi. No chest wall deformities or tenderness.  CV: PMI is nondisplaced, S1 S2 no gallops, murmurs, rubs. Peripheral pulses are full without delay.No JVD .  ABDOMEN: Bowel sounds normal nontender  No guard or rebound, no hepato splenomegal no CVA tenderness.   Extremtities:  No clubbing cyanosis or edema, no acute joint swelling or redness no focal atrophy left knee previous scar arthritic changes no acute effusions normal gait NEURO:  Oriented x3, cranial nerves 3-12 appear to be intact, no obvious focal weakness,gait within normal limits no abnormal reflexes or asymmetrical SKIN: No acute rashes normal turgor, color, no bruising or petechiae. PSYCH: Oriented, good eye contact, no obvious depression anxiety, cognition and judgment appear normal. LN: no cervical axillary inguinal adenopathy  Lab Results  Component Value Date   WBC 7.4 03/12/2021   HGB 14.3  03/12/2021   HCT 42.2 03/12/2021   PLT 290.0 03/12/2021   GLUCOSE 102 (H) 03/12/2021   CHOL 164 07/04/2020   TRIG 57 07/04/2020   HDL 55 07/04/2020   LDLDIRECT 147.2 11/29/2009   LDLCALC 95 07/04/2020   ALT 17 07/04/2020   AST 25 07/04/2020   NA 138 03/12/2021   K 5.7 No hemolysis seen (H) 03/12/2021   CL 100 03/12/2021   CREATININE 0.85 03/12/2021   BUN 21 03/12/2021   CO2 31 03/12/2021   TSH 1.93 07/04/2020   PSA 0.72 07/04/2020   INR 1.0 03/12/2021   HGBA1C 6.0 03/12/2021    BP Readings from Last 3 Encounters:  03/12/21 126/70  10/06/20 134/75  07/13/20 112/72    Lab results reviewed with patient   ASSESSMENT AND PLAN:  Discussed the following assessment and plan:    ICD-10-CM   1. Arthritis of left knee  M17.12 CBC with Differential/Platelet    Basic metabolic panel    Hemoglobin A1c    Protime-INR    Protime-INR    Hemoglobin A1c    Basic metabolic panel    CBC with Differential/Platelet    2. Pre-op evaluation  Z01.818 CBC with Differential/Platelet    Basic metabolic panel    Hemoglobin A1c    Protime-INR    Protime-INR    Hemoglobin A1c    Basic metabolic panel    CBC with Differential/Platelet    3. B12 deficiency  E53.8     4. Hyperlipidemia, unspecified hyperlipidemia type  E78.5     5. Neuropathy  G62.9     6. Decreased hearing, unspecified laterality  H91.90     Acceptable risk to proceed with TKA no unstable active disease cardiovascular or neurologic or hematologic and respiratory. Laboratory studies today as required by surgery team. Will send report to  Dr. Wadie Lessen office. Expectant management. Return for as indicated, when planned.  Patient Care Team: Audra Bellard, Randall Mends, MD as PCP - General Clarene Duke (Psychiatry) Glendale Chard, DO as Consulting Physician (Neurology) Patient Instructions  Exam is good  Will forward info to Dr Turner Daniels as well as lab. Peri Jefferson luck with surgery and rehab.   Randall Torres. Jaxie Racanelli M.D.

## 2021-03-12 ENCOUNTER — Other Ambulatory Visit: Payer: Self-pay

## 2021-03-12 ENCOUNTER — Ambulatory Visit: Payer: Federal, State, Local not specified - PPO | Admitting: Internal Medicine

## 2021-03-12 ENCOUNTER — Encounter: Payer: Self-pay | Admitting: Internal Medicine

## 2021-03-12 VITALS — BP 126/70 | HR 72 | Temp 98.1°F | Ht 69.0 in | Wt 172.2 lb

## 2021-03-12 DIAGNOSIS — E785 Hyperlipidemia, unspecified: Secondary | ICD-10-CM

## 2021-03-12 DIAGNOSIS — H9190 Unspecified hearing loss, unspecified ear: Secondary | ICD-10-CM

## 2021-03-12 DIAGNOSIS — M1712 Unilateral primary osteoarthritis, left knee: Secondary | ICD-10-CM

## 2021-03-12 DIAGNOSIS — E538 Deficiency of other specified B group vitamins: Secondary | ICD-10-CM

## 2021-03-12 DIAGNOSIS — Z01818 Encounter for other preprocedural examination: Secondary | ICD-10-CM

## 2021-03-12 DIAGNOSIS — Z79899 Other long term (current) drug therapy: Secondary | ICD-10-CM

## 2021-03-12 DIAGNOSIS — G629 Polyneuropathy, unspecified: Secondary | ICD-10-CM

## 2021-03-12 LAB — BASIC METABOLIC PANEL
BUN: 21 mg/dL (ref 6–23)
CO2: 31 mEq/L (ref 19–32)
Calcium: 10 mg/dL (ref 8.4–10.5)
Chloride: 100 mEq/L (ref 96–112)
Creatinine, Ser: 0.85 mg/dL (ref 0.40–1.50)
GFR: 87.36 mL/min (ref >60.00)
Glucose, Bld: 102 mg/dL — ABNORMAL HIGH (ref 70–99)
Potassium: 5.7 mEq/L — ABNORMAL HIGH (ref 3.5–5.1)
Sodium: 138 mEq/L (ref 135–145)

## 2021-03-12 LAB — CBC WITH DIFFERENTIAL/PLATELET
Basophils Absolute: 0.1 10*3/uL (ref 0.0–0.1)
Basophils Relative: 0.8 % (ref 0.0–3.0)
Eosinophils Absolute: 0.5 10*3/uL (ref 0.0–0.7)
Eosinophils Relative: 6.1 % — ABNORMAL HIGH (ref 0.0–5.0)
HCT: 42.2 % (ref 39.0–52.0)
Hemoglobin: 14.3 g/dL (ref 13.0–17.0)
Lymphocytes Relative: 29.3 % (ref 12.0–46.0)
Lymphs Abs: 2.2 10*3/uL (ref 0.7–4.0)
MCHC: 33.8 g/dL (ref 30.0–36.0)
MCV: 89.6 fl (ref 78.0–100.0)
Monocytes Absolute: 0.5 10*3/uL (ref 0.1–1.0)
Monocytes Relative: 6.5 % (ref 3.0–12.0)
Neutro Abs: 4.2 10*3/uL (ref 1.4–7.7)
Neutrophils Relative %: 57.3 % (ref 43.0–77.0)
Platelets: 290 10*3/uL (ref 150.0–400.0)
RBC: 4.71 Mil/uL (ref 4.22–5.81)
RDW: 13.2 % (ref 11.5–15.5)
WBC: 7.4 10*3/uL (ref 4.0–10.5)

## 2021-03-12 LAB — PROTIME-INR
INR: 1 ratio (ref 0.8–1.0)
Prothrombin Time: 11 s (ref 9.6–13.1)

## 2021-03-12 LAB — HEMOGLOBIN A1C: Hgb A1c MFr Bld: 6 % (ref 4.6–6.5)

## 2021-03-12 NOTE — Patient Instructions (Signed)
Exam is good  Will forward info to Dr Turner Daniels as well as lab. Peri Jefferson luck with surgery and rehab.

## 2021-03-13 NOTE — Progress Notes (Signed)
So blood work is fine except potassium level is reading high at 5.7.  Can be a lab draw affect .  It should be repeated Repeat BMP well-hydrated  Make a copy and send with faxed form to Dr. Turner Daniels

## 2021-03-14 NOTE — Addendum Note (Signed)
Addended by: Christy Sartorius on: 03/14/2021 09:41 AM   Modules accepted: Orders

## 2021-03-15 NOTE — Telephone Encounter (Unsigned)
Do not worry   no evidence of leukemia  blood count is fine  as well as your exam.   Potassium level is probably a lab draw artifact ( blood  cells get squeezed though the needle and spill potassium  out into the lab specimen) We see this  without reason sometimes .  Not a disease.   Make sure hydrated when blood is drawn   to avoid  the problem   Do not think about this !.  Need to have a good holiday weekend.Marland KitchenMarland Kitchen

## 2021-03-26 ENCOUNTER — Other Ambulatory Visit: Payer: Self-pay | Admitting: Internal Medicine

## 2021-03-26 ENCOUNTER — Other Ambulatory Visit: Payer: Self-pay

## 2021-03-26 ENCOUNTER — Other Ambulatory Visit (INDEPENDENT_AMBULATORY_CARE_PROVIDER_SITE_OTHER): Payer: Federal, State, Local not specified - PPO

## 2021-03-26 DIAGNOSIS — Z79899 Other long term (current) drug therapy: Secondary | ICD-10-CM

## 2021-03-26 DIAGNOSIS — E785 Hyperlipidemia, unspecified: Secondary | ICD-10-CM | POA: Diagnosis not present

## 2021-03-26 LAB — BASIC METABOLIC PANEL
BUN: 17 mg/dL (ref 6–23)
CO2: 28 mEq/L (ref 19–32)
Calcium: 9.5 mg/dL (ref 8.4–10.5)
Chloride: 99 mEq/L (ref 96–112)
Creatinine, Ser: 0.84 mg/dL (ref 0.40–1.50)
GFR: 87.65 mL/min (ref >60.00)
Glucose, Bld: 103 mg/dL — ABNORMAL HIGH (ref 70–99)
Potassium: 3.7 mEq/L (ref 3.5–5.1)
Sodium: 134 mEq/L — ABNORMAL LOW (ref 135–145)

## 2021-03-27 NOTE — Progress Notes (Signed)
Repeat lab show now potassium is normal  will send results to Dr Turner Daniels

## 2021-07-10 ENCOUNTER — Encounter: Payer: Federal, State, Local not specified - PPO | Admitting: Internal Medicine

## 2021-07-25 ENCOUNTER — Encounter: Payer: Federal, State, Local not specified - PPO | Admitting: Internal Medicine

## 2021-08-02 ENCOUNTER — Other Ambulatory Visit: Payer: Self-pay | Admitting: Internal Medicine

## 2021-08-06 NOTE — Progress Notes (Signed)
Chief Complaint  Patient presents with   Annual Exam     HPI: Patient  Randall Torres  70 y.o. comes in today for Preventive Health Care visit and medication disease management Now fully retired does volunteer family possibilities No urologist   Taking b12 tablets   for the neuropathy symptoms better No longer sees urologist Sees psychiatrist same medicines stable. HLD simvastatin.  No concerns    Health Maintenance  Topic Date Due   COVID-19 Vaccine (4 - Booster for Pfizer series) 04/10/2021   TETANUS/TDAP  12/05/2024   COLONOSCOPY (Pts 45-44yrs Insurance coverage will need to be confirmed)  08/03/2028   Pneumonia Vaccine 67+ Years old  Completed   INFLUENZA VACCINE  Completed   Hepatitis C Screening  Completed   Zoster Vaccines- Shingrix  Completed   HPV VACCINES  Aged Out   Health Maintenance Review LIFESTYLE:  Exercise:    walking  2-3 miles per day  Tobacco/ETS:n Alcohol: n Sugar beverages:   some  Sleep: a lot.  Drug use: no HH of 2   [facility]unteer hospital  family   mondays       ROS:  REST of 12 system review negative except as per HPI   Past Medical History:  Diagnosis Date   ADD (attention deficit disorder)    ADJUSTMENT DISORDER 09/29/2007   Qualifier: Diagnosis of  By: Fabian Sharp MD, Neta Mends    BPH (benign prostatic hyperplasia)    has seen urologist   Hyperlipidemia    Inguinal hernia    bilateral   Personal history of colonic adenoma 06/30/2008   Personal history of COVID-04 October 2020   Rhinitis 12/31/2010    Past Surgical History:  Procedure Laterality Date   CYSTOSCOPY     INGUINAL HERNIA REPAIR Bilateral 10/18/2014   Procedure: LAPAROSCOPIC BILATERAL INGUINAL HERNIA REPAIR  ;  Surgeon: Claud Kelp, MD;  Location: [facility];  Service: General;  Laterality: Bilateral;   INGUINAL HERNIA REPAIR Right 07/13/2020   Procedure: RIGHT INGUINAL HERNIA REPAIR;  Surgeon: Ovidio Kin, MD;  Location: [facility];  Service:  General;  Laterality: Right;   INSERTION OF MESH Bilateral 10/18/2014   Procedure: INSERTION OF MESH;  Surgeon: Claud Kelp, MD;  Location: [facility];  Service: General;  Laterality: Bilateral;   INSERTION OF MESH Right 07/13/2020   Procedure: INSERTION OF MESH;  Surgeon: Ovidio Kin, MD;  Location: [facility];  Service: General;  Laterality: Right;   KNEE ARTHROSCOPY     TOTAL KNEE ARTHROPLASTY  11/08   medial compartmental     Family History  Problem Relation Age of Onset   Diabetes Other    Other Other        CVA age 55   Other Other        sleep problems   Myasthenia gravis Brother 32       deceased   Hyperlipidemia Other    Alcohol abuse Other        Uncle   Other Mother    Stroke Father    Diabetes Father    Throat cancer Sister    Breast cancer Sister    Healthy Son    Neuropathy Neg Hx     Social History   Socioeconomic History   Marital status: Married    Spouse name: Not on file   Number of children: Not on file   Years of education: Not on file   Highest education level: Not  on file  Occupational History   Not on file  Tobacco Use   Smoking status: Never   Smokeless tobacco: Never  Vaping Use   Vaping Use: Never used  Substance and Sexual Activity   Alcohol use: No    Alcohol/week: 0.0 standard drinks    Comment: Hx of alcohol abuse - none since 2010   Drug use: No   Sexual activity: Not on file  Other Topics Concern   Not on file  Social History Narrative   Occupation: Publishing rights manager. retired 2021   Highest level of education: law school   Married   Exercise: Swims daily  5-6 days per week.    Goes to AA  5 day per week or so.      HH of 2 dog     Wife x for breast cancer    2 children.   Lives with wife in a 2 story home.    Social Determinants of Health   Financial Resource Strain: Not on file  Food Insecurity: Not on file  Transportation Needs: Not on file  Physical Activity: Not on file  Stress: Not on file   Social Connections: Not on file    Outpatient Medications Prior to Visit  Medication Sig Dispense Refill   clonazePAM (KLONOPIN) 0.5 MG tablet   0   DULoxetine (CYMBALTA) 60 MG capsule Take 60 mg by mouth daily.     NON FORMULARY "flomax" per patient     simvastatin (ZOCOR) 40 MG tablet Take 1 tablet (40 mg total) by mouth daily at 6 PM. 90 tablet 3   tamsulosin (FLOMAX) 0.4 MG CAPS capsule TAKE 1 CAPSULE(0.4 MG) BY MOUTH DAILY AFTER AND BREAKFAST 30 capsule 3   vitamin B-12 (CYANOCOBALAMIN) 1000 MCG tablet Take 1,000 mcg by mouth daily.     cyanocobalamin 1000 MCG tablet Take 1,000 mcg by mouth daily.     lidocaine (XYLOCAINE) 2 % solution Use as directed 2 mLs in the mouth or throat as needed for mouth pain. 15 mL 0   sertraline (ZOLOFT) 100 MG tablet Take 100 mg by mouth daily.     Facility-Administered Medications Prior to Visit  Medication Dose Route Frequency Provider Last Rate Last Admin   cyanocobalamin ((VITAMIN B-12)) injection 1,000 mcg  1,000 mcg Intramuscular Once Patel, Donika K, DO         EXAM:  BP 134/70 (BP Location: Left Arm, Patient Position: Sitting, Cuff Size: Normal)   Pulse (!) 57   Temp 98.5 F (36.9 C) (Oral)   Ht  (1.753 m)   Wt 175 lb (79.4 kg)   SpO2 98%   BMI 25.84 kg/m   Body mass index is 25.84 kg/m. Wt Readings from Last 3 Encounters:  08/07/21 175 lb (79.4 kg)  03/12/21 172 lb 3.2 oz (78.1 kg)  10/12/20 171 lb (77.6 kg)    Physical Exam: Vital signs reviewed RUE:AVWU is a well-developed well-nourished alert cooperative    who appearsr stated age in no acute distress.  HEENT: normocephalic atraumatic , Eyes: PERRL EOM's full, conjunctiva clear, Nares: paten,t no deformity discharge or tenderness., Ears: hearing aids . Mouth:masked  NECK: supple without masses, thyromegaly or bruits. CHEST/PULM:  Clear to auscultation and percussion breath sounds equal no wheeze , rales or rhonchi. No chest wall deformities or tenderness. CV: PMI  is nondisplaced, S1 S2 no gallops, murmurs, rubs. Peripheral pulses are full without delay.No JVD .  ABDOMEN: Bowel sounds normal nontender  No guard or  rebound, no hepato splenomegal no CVA tenderness.   Extremtities:  No clubbing cyanosis or edema, no acute joint swelling or redness no focal atrophy healed knee surgical scar   NEURO:  Oriented x3, cranial nerves 3-12 appear to be intact, no obvious focal weakness,gait within normal limits no abnormal reflexes or asymmetrical SKIN: No acute rashes normal turgor, color, no bruising or petechiae. PSYCH: Oriented, good eye contact, no obvious depression anxiety, cognition and judgment appear normal. LN: no cervical axillary inguinal adenopathy  Lab Results  Component Value Date   WBC 7.4 03/12/2021   HGB 14.3 03/12/2021   HCT 42.2 03/12/2021   PLT 290.0 03/12/2021   GLUCOSE 103 (H) 03/26/2021   CHOL 164 07/04/2020   TRIG 57 07/04/2020   HDL 55 07/04/2020   LDLDIRECT 147.2 11/29/2009   LDLCALC 95 07/04/2020   ALT 17 07/04/2020   AST 25 07/04/2020   NA 134 (L) 03/26/2021   K 3.7 03/26/2021   CL 99 03/26/2021   CREATININE 0.84 03/26/2021   BUN 17 03/26/2021   CO2 28 03/26/2021   TSH 1.93 07/04/2020   PSA 0.72 07/04/2020   INR 1.0 03/12/2021   HGBA1C 6.0 03/12/2021    BP Readings from Last 3 Encounters:  08/07/21 134/70  03/12/21 126/70  10/06/20 134/75    Lab results reviewed with patient   ASSESSMENT AND PLAN:  Discussed the following assessment and plan:    ICD-10-CM   1. Visit for preventive health examination  Z00.00     2. Hyperlipidemia, unspecified hyperlipidemia type  E78.5 Lipid panel    Basic metabolic panel    CBC with Differential/Platelet    Hepatic function panel    TSH    PSA    Hemoglobin A1c    Hemoglobin A1c    PSA    TSH    Hepatic function panel    CBC with Differential/Platelet    Basic metabolic panel    Lipid panel    3. Decreased hearing, unspecified laterality  H91.90 Lipid panel     Basic metabolic panel    CBC with Differential/Platelet    Hepatic function panel    TSH    PSA    Hemoglobin A1c    Hemoglobin A1c    PSA    TSH    Hepatic function panel    CBC with Differential/Platelet    Basic metabolic panel    Lipid panel    4. Neuropathy  G62.9 Lipid panel    Basic metabolic panel    CBC with Differential/Platelet    Hepatic function panel    TSH    PSA    Hemoglobin A1c    Vitamin B12    Vitamin B12    Hemoglobin A1c    PSA    TSH    Hepatic function panel    CBC with Differential/Platelet    Basic metabolic panel    Lipid panel    5. B12 deficiency  E53.8 Lipid panel    Basic metabolic panel    CBC with Differential/Platelet    Hepatic function panel    TSH    PSA    Hemoglobin A1c    Vitamin B12    Vitamin B12    Hemoglobin A1c    PSA    TSH    Hepatic function panel    CBC with Differential/Platelet    Basic metabolic panel    Lipid panel    6. Personal history of colonic adenoma  Z86.010  Lipid panel    Basic metabolic panel    CBC with Differential/Platelet    Hepatic function panel    TSH    PSA    Hemoglobin A1c    Hemoglobin A1c    PSA    TSH    Hepatic function panel    CBC with Differential/Platelet    Basic metabolic panel    Lipid panel    7. Medication management  Z79.899 Lipid panel    Basic metabolic panel    CBC with Differential/Platelet    Hepatic function panel    TSH    PSA    Hemoglobin A1c    Vitamin B12    Vitamin B12    Hemoglobin A1c    PSA    TSH    Hepatic function panel    CBC with Differential/Platelet    Basic metabolic panel    Lipid panel    8. Benign prostatic hyperplasia with lower urinary tract symptoms, symptom details unspecified  N40.1 Lipid panel    Basic metabolic panel    CBC with Differential/Platelet    Hepatic function panel    TSH    PSA    Hemoglobin A1c    Hemoglobin A1c    PSA    TSH    Hepatic function panel    CBC with Differential/Platelet    Basic  metabolic panel    Lipid panel    Updated labs  monitoring  Return in about 1 year (around 08/07/2022) for depending on results, cpx with labs.  Patient Care Team: Kenyada Hy, Neta Mends, MD as PCP - General Clarene Duke (Psychiatry) Glendale Chard, DO as Consulting Physician (Neurology) Patient Instructions  Good to see you today  . Will notify you  of labs when available.   Continue lifestyle intervention healthy eating and exercise .  If all ok then yearly check    Health Maintenance, Male Adopting a healthy lifestyle and getting preventive care are important in promoting health and wellness. Ask your health care provider about: The right schedule for you to have regular tests and exams. Things you can do on your own to prevent diseases and keep yourself healthy. What should I know about diet, weight, and exercise? Eat a healthy diet  Eat a diet that includes plenty of vegetables, fruits, low-fat dairy products, and lean protein. Do not eat a lot of foods that are high in solid fats, added sugars, or sodium. Maintain a healthy weight Body mass index (BMI) is a measurement that can be used to identify possible weight problems. It estimates body fat based on height and weight. Your health care provider can help determine your BMI and help you achieve or maintain a healthy weight. Get regular exercise Get regular exercise. This is one of the most important things you can do for your health. Most adults should: Exercise for at least 150 minutes each week. The exercise should increase your heart rate and make you sweat (moderate-intensity exercise). Do strengthening exercises at least twice a week. This is in addition to the moderate-intensity exercise. Spend less time sitting. Even light physical activity can be beneficial. Watch cholesterol and blood lipids Have your blood tested for lipids and cholesterol at 72 years of age, then have this test every 5 years. You may need to  have your cholesterol levels checked more often if: Your lipid or cholesterol levels are high. You are older than 72 years of age. You are at high risk for heart disease.  What should I know about cancer screening? Many types of cancers can be detected early and may often be prevented. Depending on your health history and family history, you may need to have cancer screening at various ages. This may include screening for: Colorectal cancer. Prostate cancer. Skin cancer. Lung cancer. What should I know about heart disease, diabetes, and high blood pressure? Blood pressure and heart disease High blood pressure causes heart disease and increases the risk of stroke. This is more likely to develop in people who have high blood pressure readings or are overweight. Talk with your health care provider about your target blood pressure readings. Have your blood pressure checked: Every 3-5 years if you are 46-72 years of age. Every year if you are 46 years old or older. If you are between the ages of 41 and 19 and are a current or former smoker, ask your health care provider if you should have a one-time screening for abdominal aortic aneurysm (AAA). Diabetes Have regular diabetes screenings. This checks your fasting blood sugar level. Have the screening done: Once every three years after age 58 if you are at a normal weight and have a low risk for diabetes. More often and at a younger age if you are overweight or have a high risk for diabetes. What should I know about preventing infection? Hepatitis B If you have a higher risk for hepatitis B, you should be screened for this virus. Talk with your health care provider to find out if you are at risk for hepatitis B infection. Hepatitis C Blood testing is recommended for: Everyone born from [birth year] through 1967. Anyone with known risk factors for hepatitis C. Sexually transmitted infections (STIs) You should be screened each year for STIs, including  gonorrhea and chlamydia, if: You are sexually active and are younger than 72 years of age. You are older than 72 years of age and your health care provider tells you that you are at risk for this type of infection. Your sexual activity has changed since you were last screened, and you are at increased risk for chlamydia or gonorrhea. Ask your health care provider if you are at risk. Ask your health care provider about whether you are at high risk for HIV. Your health care provider may recommend a prescription medicine to help prevent HIV infection. If you choose to take medicine to prevent HIV, you should first get tested for HIV. You should then be tested every 3 months for as long as you are taking the medicine. Follow these instructions at home: Alcohol use Do not drink alcohol if your health care provider tells you not to drink. If you drink alcohol: Limit how much you have to 0-2 drinks a day. Know how much alcohol is in your drink. In the [place] one drink equals one 12 oz bottle of beer (355 mL), one 5 oz glass of wine (148 mL), or one 1 oz glass of hard liquor (44 mL). Lifestyle Do not use any products that contain nicotine or tobacco. These products include cigarettes, chewing tobacco, and vaping devices, such as e-cigarettes. If you need help quitting, ask your health care provider. Do not use street drugs. Do not share needles. Ask your health care provider for help if you need support or information about quitting drugs. General instructions Schedule regular health, dental, and eye exams. Stay current with your vaccines. Tell your health care provider if: You often feel depressed. You have ever been abused or do not feel safe  at home. Summary Adopting a healthy lifestyle and getting preventive care are important in promoting health and wellness. Follow your health care provider's instructions about healthy diet, exercising, and getting tested or screened for diseases. Follow your  health care provider's instructions on monitoring your cholesterol and blood pressure. This information is not intended to replace advice given to you by your health care provider. Make sure you discuss any questions you have with your health care provider. Document Revised: 01/22/2021 Document Reviewed: 01/22/2021 Elsevier Patient Education  2022 [employer].  [place]. Jedrick Hutcherson M.D.

## 2021-08-07 ENCOUNTER — Encounter: Payer: Self-pay | Admitting: Internal Medicine

## 2021-08-07 ENCOUNTER — Ambulatory Visit (INDEPENDENT_AMBULATORY_CARE_PROVIDER_SITE_OTHER): Payer: Federal, State, Local not specified - PPO | Admitting: Internal Medicine

## 2021-08-07 VITALS — BP 134/70 | HR 57 | Temp 98.5°F | Ht 69.0 in | Wt 175.0 lb

## 2021-08-07 DIAGNOSIS — H9190 Unspecified hearing loss, unspecified ear: Secondary | ICD-10-CM

## 2021-08-07 DIAGNOSIS — E538 Deficiency of other specified B group vitamins: Secondary | ICD-10-CM

## 2021-08-07 DIAGNOSIS — Z86010 Personal history of colonic polyps: Secondary | ICD-10-CM | POA: Diagnosis not present

## 2021-08-07 DIAGNOSIS — G629 Polyneuropathy, unspecified: Secondary | ICD-10-CM

## 2021-08-07 DIAGNOSIS — Z79899 Other long term (current) drug therapy: Secondary | ICD-10-CM

## 2021-08-07 DIAGNOSIS — E785 Hyperlipidemia, unspecified: Secondary | ICD-10-CM

## 2021-08-07 DIAGNOSIS — Z0000 Encounter for general adult medical examination without abnormal findings: Secondary | ICD-10-CM | POA: Diagnosis not present

## 2021-08-07 DIAGNOSIS — N401 Enlarged prostate with lower urinary tract symptoms: Secondary | ICD-10-CM

## 2021-08-07 NOTE — Patient Instructions (Addendum)
Good to see you today  . Will notify you  of labs when available.   Continue lifestyle intervention healthy eating and exercise .  If all ok then yearly check    Health Maintenance, Male Adopting a healthy lifestyle and getting preventive care are important in promoting health and wellness. Ask your health care provider about: The right schedule for you to have regular tests and exams. Things you can do on your own to prevent diseases and keep yourself healthy. What should I know about diet, weight, and exercise? Eat a healthy diet  Eat a diet that includes plenty of vegetables, fruits, low-fat dairy products, and lean protein. Do not eat a lot of foods that are high in solid fats, added sugars, or sodium. Maintain a healthy weight Body mass index (BMI) is a measurement that can be used to identify possible weight problems. It estimates body fat based on height and weight. Your health care provider can help determine your BMI and help you achieve or maintain a healthy weight. Get regular exercise Get regular exercise. This is one of the most important things you can do for your health. Most adults should: Exercise for at least 150 minutes each week. The exercise should increase your heart rate and make you sweat (moderate-intensity exercise). Do strengthening exercises at least twice a week. This is in addition to the moderate-intensity exercise. Spend less time sitting. Even light physical activity can be beneficial. Watch cholesterol and blood lipids Have your blood tested for lipids and cholesterol at 72 years of age, then have this test every 5 years. You may need to have your cholesterol levels checked more often if: Your lipid or cholesterol levels are high. You are older than 72 years of age. You are at high risk for heart disease. What should I know about cancer screening? Many types of cancers can be detected early and may often be prevented. Depending on your health history  and family history, you may need to have cancer screening at various ages. This may include screening for: Colorectal cancer. Prostate cancer. Skin cancer. Lung cancer. What should I know about heart disease, diabetes, and high blood pressure? Blood pressure and heart disease High blood pressure causes heart disease and increases the risk of stroke. This is more likely to develop in people who have high blood pressure readings or are overweight. Talk with your health care provider about your target blood pressure readings. Have your blood pressure checked: Every 3-5 years if you are 24-26 years of age. Every year if you are 12 years old or older. If you are between the ages of 72 and 57 and are a current or former smoker, ask your health care provider if you should have a one-time screening for abdominal aortic aneurysm (AAA). Diabetes Have regular diabetes screenings. This checks your fasting blood sugar level. Have the screening done: Once every three years after age 55 if you are at a normal weight and have a low risk for diabetes. More often and at a younger age if you are overweight or have a high risk for diabetes. What should I know about preventing infection? Hepatitis B If you have a higher risk for hepatitis B, you should be screened for this virus. Talk with your health care provider to find out if you are at risk for hepatitis B infection. Hepatitis C Blood testing is recommended for: Everyone born from [birth year] through 1967. Anyone with known risk factors for hepatitis C. Sexually transmitted infections (STIs)  You should be screened each year for STIs, including gonorrhea and chlamydia, if: You are sexually active and are younger than 72 years of age. You are older than 72 years of age and your health care provider tells you that you are at risk for this type of infection. Your sexual activity has changed since you were last screened, and you are at increased risk for chlamydia  or gonorrhea. Ask your health care provider if you are at risk. Ask your health care provider about whether you are at high risk for HIV. Your health care provider may recommend a prescription medicine to help prevent HIV infection. If you choose to take medicine to prevent HIV, you should first get tested for HIV. You should then be tested every 3 months for as long as you are taking the medicine. Follow these instructions at home: Alcohol use Do not drink alcohol if your health care provider tells you not to drink. If you drink alcohol: Limit how much you have to 0-2 drinks a day. Know how much alcohol is in your drink. In the [place] one drink equals one 12 oz bottle of beer (355 mL), one 5 oz glass of wine (148 mL), or one 1 oz glass of hard liquor (44 mL). Lifestyle Do not use any products that contain nicotine or tobacco. These products include cigarettes, chewing tobacco, and vaping devices, such as e-cigarettes. If you need help quitting, ask your health care provider. Do not use street drugs. Do not share needles. Ask your health care provider for help if you need support or information about quitting drugs. General instructions Schedule regular health, dental, and eye exams. Stay current with your vaccines. Tell your health care provider if: You often feel depressed. You have ever been abused or do not feel safe at home. Summary Adopting a healthy lifestyle and getting preventive care are important in promoting health and wellness. Follow your health care provider's instructions about healthy diet, exercising, and getting tested or screened for diseases. Follow your health care provider's instructions on monitoring your cholesterol and blood pressure. This information is not intended to replace advice given to you by your health care provider. Make sure you discuss any questions you have with your health care provider. Document Revised: 01/22/2021 Document Reviewed:  01/22/2021 Elsevier Patient Education  2022 [employer].

## 2021-08-08 LAB — LIPID PANEL
Cholesterol: 169 mg/dL (ref 0–200)
HDL: 62.5 mg/dL (ref >39.00)
LDL Cholesterol: 93 mg/dL (ref 0–99)
NonHDL: 106.36
Total CHOL/HDL Ratio: 3
Triglycerides: 67 mg/dL (ref 0.0–149.0)
VLDL: 13.4 mg/dL (ref 0.0–40.0)

## 2021-08-08 LAB — BASIC METABOLIC PANEL
BUN: 17 mg/dL (ref 6–23)
CO2: 32 mEq/L (ref 19–32)
Calcium: 9.6 mg/dL (ref 8.4–10.5)
Chloride: 100 mEq/L (ref 96–112)
Creatinine, Ser: 0.81 mg/dL (ref 0.40–1.50)
GFR: 88.38 mL/min (ref >60.00)
Glucose, Bld: 90 mg/dL (ref 70–99)
Potassium: 4.2 mEq/L (ref 3.5–5.1)
Sodium: 138 mEq/L (ref 135–145)

## 2021-08-08 LAB — CBC WITH DIFFERENTIAL/PLATELET
Basophils Absolute: 0.1 10*3/uL (ref 0.0–0.1)
Basophils Relative: 0.9 % (ref 0.0–3.0)
Eosinophils Absolute: 0.2 10*3/uL (ref 0.0–0.7)
Eosinophils Relative: 2.6 % (ref 0.0–5.0)
HCT: 39.9 % (ref 39.0–52.0)
Hemoglobin: 13.2 g/dL (ref 13.0–17.0)
Lymphocytes Relative: 30 % (ref 12.0–46.0)
Lymphs Abs: 2.2 10*3/uL (ref 0.7–4.0)
MCHC: 32.9 g/dL (ref 30.0–36.0)
MCV: 86.8 fl (ref 78.0–100.0)
Monocytes Absolute: 0.4 10*3/uL (ref 0.1–1.0)
Monocytes Relative: 4.9 % (ref 3.0–12.0)
Neutro Abs: 4.6 10*3/uL (ref 1.4–7.7)
Neutrophils Relative %: 61.6 % (ref 43.0–77.0)
Platelets: 302 10*3/uL (ref 150.0–400.0)
RBC: 4.6 Mil/uL (ref 4.22–5.81)
RDW: 14.7 % (ref 11.5–15.5)
WBC: 7.5 10*3/uL (ref 4.0–10.5)

## 2021-08-08 LAB — HEPATIC FUNCTION PANEL
ALT: 21 U/L (ref 0–53)
AST: 32 U/L (ref 0–37)
Albumin: 4.3 g/dL (ref 3.5–5.2)
Alkaline Phosphatase: 72 U/L (ref 39–117)
Bilirubin, Direct: 0.1 mg/dL (ref 0.0–0.3)
Total Bilirubin: 0.6 mg/dL (ref 0.2–1.2)
Total Protein: 7.6 g/dL (ref 6.0–8.3)

## 2021-08-08 LAB — VITAMIN B12: Vitamin B-12: 1336 pg/mL — ABNORMAL HIGH (ref 211–911)

## 2021-08-08 LAB — HEMOGLOBIN A1C: Hgb A1c MFr Bld: 6.1 % (ref 4.6–6.5)

## 2021-08-08 LAB — PSA: PSA: 0.91 ng/mL (ref 0.10–4.00)

## 2021-08-08 LAB — TSH: TSH: 1.37 u[IU]/mL (ref 0.35–5.50)

## 2021-08-13 NOTE — Progress Notes (Signed)
Results are stable or at goal vitamin B12 is still in the high range can decrease the amount taken by 1 to 2 days a week. Continue yearly visits and lab monitoring

## 2021-08-29 ENCOUNTER — Other Ambulatory Visit: Payer: Self-pay | Admitting: Internal Medicine

## 2021-12-06 ENCOUNTER — Encounter: Payer: Self-pay | Admitting: Internal Medicine

## 2021-12-21 ENCOUNTER — Other Ambulatory Visit: Payer: Self-pay | Admitting: Internal Medicine

## 2021-12-24 LAB — HM DIABETES EYE EXAM

## 2022-04-03 ENCOUNTER — Telehealth: Payer: Self-pay | Admitting: Internal Medicine

## 2022-04-03 NOTE — Telephone Encounter (Signed)
Pt is calling and he is getting covid boosters at [facility] and would like to know if he should get prevnar 20 or pneumovax 23 . Please advise

## 2022-04-03 NOTE — Telephone Encounter (Signed)
Pt made aware that he was UTD on the immunizations he was inquiring on .

## 2022-04-03 NOTE — Telephone Encounter (Signed)
Noted  

## 2022-04-03 NOTE — Telephone Encounter (Signed)
Last Ov 08/07/21 ?Please advise   ?

## 2022-04-03 NOTE — Telephone Encounter (Signed)
Not needed he  os UTD  on prevnar 13 and pneumovax 23

## 2022-05-06 ENCOUNTER — Ambulatory Visit (INDEPENDENT_AMBULATORY_CARE_PROVIDER_SITE_OTHER): Payer: Federal, State, Local not specified - PPO | Admitting: Psychology

## 2022-05-06 DIAGNOSIS — F331 Major depressive disorder, recurrent, moderate: Secondary | ICD-10-CM
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
## 2022-05-13 ENCOUNTER — Ambulatory Visit (INDEPENDENT_AMBULATORY_CARE_PROVIDER_SITE_OTHER): Payer: Federal, State, Local not specified - PPO | Admitting: Psychology

## 2022-05-13 DIAGNOSIS — F331 Major depressive disorder, recurrent, moderate: Secondary | ICD-10-CM | POA: Diagnosis not present

## 2022-05-13 NOTE — Progress Notes (Addendum)
[place] Counselor Initial Adult Exam  Name: Randall Torres Date: 05/13/2022 MRN: [id number] DOB: Feb 20, 1951 PCP: Burnis Medin, MD     Randall Torres participated from home, via video, and consented to treatment. Therapist participated from home office. We met online due to [place] pandemic.   Guardian/Payee:  N/A    Paperwork requested: No   Reason for Visit /Presenting Problem: Seeking to reduce symptoms of anxiety and depression  Mental Status Exam: Appearance:   Casual     Behavior:  Appropriate  Motor:  Normal  Speech/Language:   Normal Rate  Affect:  Appropriate  Mood:  normal  Thought process:  normal  Thought content:    WNL  Sensory/Perceptual disturbances:    WNL  Orientation:  oriented to person, place, and situation  Attention:  Good  Concentration:  Good  Memory:  WNL  Fund of knowledge:   Good  Insight:    Good  Judgment:   Good  Impulse Control:  Good    Reported Symptoms:  sadness, depleted self esteem, lack of motivation, agitation/worry (anxiety),  Risk Assessment: Danger to Self:  No Self-injurious Behavior: No Danger to Others: No Duty to Warn:no Physical Aggression / Violence:No  Access to Firearms a concern: No  Gang Involvement:No  Patient / guardian was educated about steps to take if suicide or homicide risk level increases between visits: yes While future psychiatric events cannot be accurately predicted, the patient does not currently require acute inpatient psychiatric care and does not currently meet [facility] involuntary commitment criteria.  Substance Abuse History: Current substance abuse: Yes     Past Psychiatric History:   Previous psychological history is significant for depression and alcohol abuse Outpatient Providers:unknown History of Psych Hospitalization: No  Psychological Testing:  unknown    Abuse History:  Victim of: No.,  N/A    Report needed: No. Victim of  Neglect:No. Perpetrator of  N/A   Witness / Exposure to Domestic Violence: No   Protective Services Involvement: No  Witness to [employer] Violence:  No   Family History:  Family History  Problem Relation Age of Onset   Diabetes Other    Other Other        CVA age 42   Other Other        sleep problems   Myasthenia gravis Brother 61       deceased   Hyperlipidemia Other    Alcohol abuse Other        Uncle   Other Mother    Stroke Father    Diabetes Father    Throat cancer Sister    Breast cancer Sister    Healthy Son    Neuropathy Neg Hx     Living situation: the patient lives with their spouse  Sexual Orientation: Straight  Relationship Status: married  Name of spouse / other:unknown If a parent, number of children / ages:2 boys, 51 and 36  Support Systems: spouse  Museum/gallery curator Stress:  No   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 History of legal issue /  charges:  N/A  Medical History/Surgical History: reviewed Past Medical History:  Diagnosis Date   ADD (attention deficit disorder)    ADJUSTMENT DISORDER 09/29/2007   Qualifier: Diagnosis of  By: Regis Bill MD, Standley Brooking    BPH (benign prostatic hyperplasia)    has seen urologist   Hyperlipidemia    Inguinal hernia    bilateral   Personal history of colonic adenoma 06/30/2008   Personal history of COVID-04 October 2020   Rhinitis 12/31/2010    Past Surgical History:  Procedure Laterality Date   CYSTOSCOPY      INGUINAL HERNIA REPAIR Bilateral 10/18/2014   Procedure: LAPAROSCOPIC BILATERAL INGUINAL HERNIA REPAIR  ;  Surgeon: Fanny Skates, MD;  [facility];  Service: General;  Laterality: Bilateral;   INGUINAL HERNIA REPAIR Right 07/13/2020   Procedure: RIGHT INGUINAL HERNIA REPAIR;  Surgeon: Alphonsa Overall, MD;  Location: [place];  Service: General;  Laterality: Right;   INSERTION OF MESH Bilateral 10/18/2014   Procedure: INSERTION OF MESH;  Surgeon: Fanny Skates, MD;  [facility];  Service: General;  Laterality: Bilateral;   INSERTION OF MESH Right 07/13/2020   Procedure: INSERTION OF MESH;  Surgeon: Alphonsa Overall, MD;  Location: [place];  Service: General;  Laterality: Right;   KNEE ARTHROSCOPY     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     NON FORMULARY "flomax" per patient     simvastatin (ZOCOR) 40 MG tablet TAKE 1 TABLET(40 MG) BY MOUTH DAILY AT 6 PM 90 tablet 3   tamsulosin (FLOMAX) 0.4 MG CAPS capsule Take 1 capsule (0.4 mg total) by mouth daily. 30 capsule 5   vitamin B-12 (CYANOCOBALAMIN) 1000 MCG tablet Take 1,000 mcg by mouth daily.     Current Facility-Administered Medications  Medication Dose Route Frequency Provider Last Rate Last Admin   cyanocobalamin ((VITAMIN B-12)) injection 1,000 mcg  1,000 mcg Intramuscular Once Patel, Donika K, DO        No Known Allergies        Goals: Engage in outpatient psychotherapy to manage anxiety and reduce symptoms of depression (sadness, depleted esteem, low motivation). Will continue evaluation  and reduce reported symptoms (goal date 12-23). Diagnoses:  Depression, Gen Anxiety  Plan of Care: Outpatient Psychotherapy Session Notes: He had stopped all psychotropics on Aug. 15th. He spoke with  doctor (psychiatrist) and was told that he will see him in October. Randall Torres has "missed" his meds and has had some challenges sleeping. His interpretation is that the doctor feels it is time to get a "clean slate".  He reports that his sleep is "hit and miss". Some nights cannot get to sleep until midnight and other nights can get to bed at 9:00. Has not been able to identify any triggers to having poor sleep. Is continuing to exercise daily. When he wakes up, his depression/anxiety is a 9 or 10 and then it comes down after a walk to about a 3. Stays that way until afternoon, when it goes to a 6 or 7. Goes back down to a 2 or 1 at end of day. "Might" spike at night if he can't sleep. Cannot distinguish between the anxiety and the depression. Continually says he "feels like a baby" for complaining  and coming to therapy for help is a weakness. Discussed his tendency to self-deprecate. When his anx/dep goes up second time in a day, he will either go for a second walk, talk to wife or stay busy to bring it back down. He will sometimes say the rosery and that will relieve his stress. His wife tell him he focuses on himself too much. He says that it is difficult for his wife to accept all of his morning rituals (exercise, church, etc.). He says that his wife and boys struggled to accept that he was an alcoholic because he did a good job of hiding it from them. She has been supportive, but does not really understand all of it. He has assured her that he would never harm himself under any circumstance "no matter what". He says "I may not be happy, but I am confident I will resolve my issues and would never do anything to hurt myself". He says that both he and his wife grew up in very dysfunctional families. He forgot to share at last session that his brother dropped out of college due to depression. Never had a very good relationship with that brother. None of his sibs are invested in having a relationship.  He is able to  have the perspective that he did well for himself in his life. He does, however, say he did not plan well for his retirement and need to find a fulfilling path. He does say he remains optimistic that his future will be positive. We discussed the differences of anxiety and depression. Told him to try and separate the two when he rates them in his journal.            Marcelina Morel, PhD Time Spent: 4:81E-5:63J 49 minutes

## 2022-05-21 ENCOUNTER — Ambulatory Visit: Payer: Federal, State, Local not specified - PPO | Admitting: Psychology

## 2022-05-21 DIAGNOSIS — F331 Major depressive disorder, recurrent, moderate: Secondary | ICD-10-CM | POA: Diagnosis not present

## 2022-05-21 NOTE — Progress Notes (Signed)
[place] Counselor Initial Adult Exam  Name: Randall Torres Date: 05/21/2022 MRN: [id number] DOB: 07-Feb-1951 PCP: Burnis Medin, MD     Randall Torres participated from home, via video, and consented to treatment. Therapist participated from home office. We met online due to [place] pandemic.   Guardian/Payee:  N/A    Paperwork requested: No   Reason for Visit /Presenting Problem: Seeking to reduce symptoms of anxiety and depression  Mental Status Exam: Appearance:   Casual     Behavior:  Appropriate  Motor:  Normal  Speech/Language:   Normal Rate  Affect:  Appropriate  Mood:  normal  Thought process:  normal  Thought content:    WNL  Sensory/Perceptual disturbances:    WNL  Orientation:  oriented to person, place, and situation  Attention:  Good  Concentration:  Good  Memory:  WNL  Fund of knowledge:   Good  Insight:    Good  Judgment:   Good  Impulse Control:  Good    Reported Symptoms:  sadness, depleted self esteem, lack of motivation, agitation/worry (anxiety),  Risk Assessment: Danger to Self:  No Self-injurious Behavior: No Danger to Others: No Duty to Warn:no Physical Aggression / Violence:No  Access to Firearms a concern: No  Gang Involvement:No  Patient / guardian was educated about steps to take if suicide or homicide risk level increases between visits: yes While future psychiatric events cannot be accurately predicted, the patient does not currently require acute inpatient psychiatric care and does not currently meet [facility] involuntary commitment criteria.  Substance Abuse History: Current substance abuse: Yes     Past Psychiatric History:   Previous psychological history is significant for depression and alcohol abuse Outpatient Providers:unknown History of Psych Hospitalization: No  Psychological Testing:  unknown    Abuse History:  Victim of: No.,  N/A    Report needed:  No. Victim of Neglect:No. Perpetrator of  N/A   Witness / Exposure to Domestic Violence: No   Protective Services Involvement: No  Witness to [employer] Violence:  No   Family History:  Family History  Problem Relation Age of Onset   Diabetes Other    Other Other        CVA age 26   Other Other        sleep problems   Myasthenia gravis Brother 63       deceased   Hyperlipidemia Other    Alcohol abuse Other        Uncle   Other Mother    Stroke Father    Diabetes Father    Throat cancer Sister    Breast cancer Sister    Healthy Son    Neuropathy Neg Hx     Living situation: the patient lives with their spouse  Sexual Orientation: Straight  Relationship Status: married  Name of spouse / other:unknown If a parent, number of children / ages:2 boys, 12 and 31  Support Systems: spouse  Museum/gallery curator Stress:  No   Income/Employment/Disability: Actor:  unknown  Educational History: Education: post Forensic psychologist work or degree  Religion/Sprituality/World View: Catholic  Any cultural differences that may affect / interfere with treatment:  N/A  Recreation/Hobbies: exercise walking  Stressors: Other: retirement    Strengths: Supportive Relationships, Family, and Church  Barriers:  unknown   Legal History: Pending legal issue /  charges: The patient has no significant history of legal issues. History of legal issue / charges:  N/A  Medical History/Surgical History: reviewed Past Medical History:  Diagnosis Date   ADD (attention deficit disorder)    ADJUSTMENT DISORDER 09/29/2007   Qualifier: Diagnosis of  By: Regis Bill MD, Standley Brooking    BPH (benign prostatic hyperplasia)    has seen urologist   Hyperlipidemia    Inguinal hernia    bilateral   Personal history of colonic adenoma 06/30/2008   Personal history of COVID-04 October 2020   Rhinitis 12/31/2010    Past Surgical History:  Procedure Laterality Date    CYSTOSCOPY     INGUINAL HERNIA REPAIR Bilateral 10/18/2014   Procedure: LAPAROSCOPIC BILATERAL INGUINAL HERNIA REPAIR  ;  Surgeon: Fanny Skates, MD;  [facility];  Service: General;  Laterality: Bilateral;   INGUINAL HERNIA REPAIR Right 07/13/2020   Procedure: RIGHT INGUINAL HERNIA REPAIR;  Surgeon: Alphonsa Overall, MD;  Location: [place];  Service: General;  Laterality: Right;   INSERTION OF MESH Bilateral 10/18/2014   Procedure: INSERTION OF MESH;  Surgeon: Fanny Skates, MD;  [facility];  Service: General;  Laterality: Bilateral;   INSERTION OF MESH Right 07/13/2020   Procedure: INSERTION OF MESH;  Surgeon: Alphonsa Overall, MD;  Location: [place];  Service: General;  Laterality: Right;   KNEE ARTHROSCOPY     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     NON FORMULARY "flomax" per patient     simvastatin (ZOCOR) 40 MG tablet TAKE 1 TABLET(40 MG) BY MOUTH DAILY AT 6 PM 90 tablet 3   tamsulosin (FLOMAX) 0.4 MG CAPS capsule Take 1 capsule (0.4 mg total) by mouth daily. 30 capsule 5   vitamin B-12 (CYANOCOBALAMIN) 1000 MCG tablet Take 1,000 mcg by mouth daily.     Current Facility-Administered Medications  Medication Dose Route Frequency Provider Last Rate Last Admin   cyanocobalamin ((VITAMIN B-12)) injection 1,000 mcg  1,000 mcg Intramuscular Once Patel, Donika K, DO        No Known Allergies        Goals: Engage in outpatient psychotherapy to manage anxiety and reduce symptoms of depression (sadness, depleted esteem, low motivation). Will continue evaluation (goal date 9-31) and reduce reported symptoms (goal date 12-23). Diagnoses:  Depression, Gen Anxiety  Plan of Care: Outpatient Psychotherapy Session Notes: He says that his weekend was  good. Today is three weeks without meds. He does not plan to go back on meds. We discussed that it may be useful to not define a timeline at this point. Need to see how he feels going down the road. As he thinks of anxiety versus depression, as discussed in last session, he is clear he mostly feels depression. He says every day is about the same. He feels bad when he gets up in morning, but knows that he feels better as the day goes on and he takes his walk.  He and wife will be going to [place] to see their son that is getting a divorce. They will be there for three days and anticipates a good visit. He does say it will be difficult in some ways, but knows he will get through it.  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 He is wanting to meet for sessions on a weekly basis so I can "get to know him". He talked about the calming experience of church. We addressed the "ritual" of church and why it provides comfort. He is comforted by the familiarity of those rituals. Essa says that he is taking the approach that there are things he needs to do and accept. He says that "life doesn't always go my way, but I need to accept it". Even though the marriage has been difficult at times, divorce is not an option due to his faith/beliefs.  Will document any days that are outliers with regard to moods.                  Marcelina Morel, PhD Time Spent: 11:35a-12:30p 55 minutes

## 2022-05-29 ENCOUNTER — Ambulatory Visit: Payer: Federal, State, Local not specified - PPO | Admitting: Psychology

## 2022-05-29 DIAGNOSIS — F331 Major depressive disorder, recurrent, moderate: Secondary | ICD-10-CM | POA: Diagnosis not present

## 2022-05-29 NOTE — Progress Notes (Signed)
[place] Counselor Initial Adult Exam  Name: Randall Torres Date: 05/29/2022 MRN: [id number] DOB: 05/28/1951 PCP: Burnis Medin, MD     Hendricks Limes participated from home, via video, and consented to treatment. Therapist participated from home office. We met online due to [place] pandemic.   Guardian/Payee:  N/A    Paperwork requested: No   Reason for Visit /Presenting Problem: Seeking to reduce symptoms of anxiety and depression  Mental Status Exam: Appearance:   Casual     Behavior:  Appropriate  Motor:  Normal  Speech/Language:   Normal Rate  Affect:  Appropriate  Mood:  normal  Thought process:  normal  Thought content:    WNL  Sensory/Perceptual disturbances:    WNL  Orientation:  oriented to person, place, and situation  Attention:  Good  Concentration:  Good  Memory:  WNL  Fund of knowledge:   Good  Insight:    Good  Judgment:   Good  Impulse Control:  Good    Reported Symptoms:  sadness, depleted self esteem, lack of motivation, agitation/worry (anxiety),  Risk Assessment: Danger to Self:  No Self-injurious Behavior: No Danger to Others: No Duty to Warn:no Physical Aggression / Violence:No  Access to Firearms a concern: No  Gang Involvement:No  Patient / guardian was educated about steps to take if suicide or homicide risk level increases between visits: yes While future psychiatric events cannot be accurately predicted, the patient does not currently require acute inpatient psychiatric care and does not currently meet [facility] involuntary commitment criteria.  Substance Abuse History: Current substance abuse: Yes     Past Psychiatric History:   Previous psychological history is significant for depression and alcohol abuse Outpatient Providers:unknown History of Psych Hospitalization: No  Psychological Testing:  unknown    Abuse History:  Victim of: No.,  N/A    Report needed: No. Victim of Neglect:No. Perpetrator  of  N/A   Witness / Exposure to Domestic Violence: No   Protective Services Involvement: No  Witness to [employer] Violence:  No   Family History:  Family History  Problem Relation Age of Onset   Diabetes Other    Other Other        CVA age 33   Other Other        sleep problems   Myasthenia gravis Brother 33       deceased   Hyperlipidemia Other    Alcohol abuse Other        Uncle   Other Mother    Stroke Father    Diabetes Father    Throat cancer Sister    Breast cancer Sister    Healthy Son    Neuropathy Neg Hx     Living situation: the patient lives with their spouse  Sexual Orientation: Straight  Relationship Status: married  Name of spouse / other:unknown If a parent, number of children / ages:2 boys, 65 and 44  Support Systems: spouse  Museum/gallery curator Stress:  No   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 History of legal issue / charges:  N/A  Medical History/Surgical History: reviewed Past Medical History:  Diagnosis Date   ADD (attention deficit disorder)    ADJUSTMENT DISORDER 09/29/2007   Qualifier: Diagnosis of  By: Regis Bill MD, Standley Brooking    BPH (benign prostatic hyperplasia)    has seen urologist   Hyperlipidemia    Inguinal hernia    bilateral   Personal history of colonic adenoma 06/30/2008   Personal history of COVID-04 October 2020   Rhinitis 12/31/2010    Past Surgical History:  Procedure Laterality Date   CYSTOSCOPY     INGUINAL HERNIA REPAIR  Bilateral 10/18/2014   Procedure: LAPAROSCOPIC BILATERAL INGUINAL HERNIA REPAIR  ;  Surgeon: Fanny Skates, MD;  Location: [facility];  Service: General;  Laterality: Bilateral;   INGUINAL HERNIA REPAIR Right 07/13/2020   Procedure: RIGHT INGUINAL HERNIA REPAIR;  Surgeon: Alphonsa Overall, MD;  Location: [place];  Service: General;  Laterality: Right;   INSERTION OF MESH Bilateral 10/18/2014   Procedure: INSERTION OF MESH;  Surgeon: Fanny Skates, MD;  [facility];  Service: General;  Laterality: Bilateral;   INSERTION OF MESH Right 07/13/2020   Procedure: INSERTION OF MESH;  Surgeon: Alphonsa Overall, MD;  Location: [place];  Service: General;  Laterality: Right;   KNEE ARTHROSCOPY     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     NON FORMULARY "flomax" per patient     simvastatin (ZOCOR) 40 MG tablet TAKE 1 TABLET(40 MG) BY MOUTH DAILY AT 6 PM 90 tablet 3   tamsulosin (FLOMAX) 0.4 MG CAPS capsule Take 1 capsule (0.4 mg total) by mouth daily. 30 capsule 5   vitamin B-12 (CYANOCOBALAMIN) 1000 MCG tablet Take 1,000 mcg by mouth daily.     Current Facility-Administered Medications  Medication Dose Route Frequency Provider Last Rate Last Admin   cyanocobalamin ((VITAMIN B-12)) injection 1,000 mcg  1,000 mcg Intramuscular Once Patel, Donika K, DO        No Known Allergies        Goals: Engage in outpatient psychotherapy to manage anxiety and reduce symptoms of depression (sadness, depleted esteem, low motivation). Will continue evaluation (goal date 9-31) and reduce reported symptoms (goal date 12-23). Diagnoses:  Depression, Gen Anxiety  Plan of Care: Outpatient Psychotherapy Session Notes: States that he is doing well and that he is feeling that there has been  some positive advances in his marriage. He and his wife went to [place] to see his son. It was difficult because his son is sad about his divorce. They also saw their  grandchildren (boy and girl). Has a good relationship with all, but it is difficult.  He will be traveling to see wife's family. They are big drinkers and that is sometimes difficult.  He says that Saturday and Sunday are "harder" for him. There was one of those days he was "struggling". He got through it by going for a walk. Says that every once in a while he has a difficult day. There does not seen to be a "rhyme or reason" when this occurs. He thinks it is the lack of structure. Talked about creating more structure on those days and determine if that helps. Roosevelt says that he and his wife are on very different pages with  regard to downsizing. She does not want to entertain the idea of a retirement community but he sees circumstances that would make it a good idea. Suggested a way to have that difficult discussion.                   Marcelina Morel, PhD Time Spent: 4:10p-5:00p 50 minutes

## 2022-06-25 ENCOUNTER — Ambulatory Visit: Payer: Federal, State, Local not specified - PPO | Admitting: Psychology

## 2022-06-26 ENCOUNTER — Other Ambulatory Visit: Payer: Self-pay | Admitting: Internal Medicine

## 2022-06-30 NOTE — Telephone Encounter (Signed)
RSV vaccine seems to  be safe and reasonable efficacy to decrease risk of RSV pneumonia for those above age 73 so I advise in people with lung heart diseases but  is  also favor  to get this at  your age .    Covid booster depending on when you had last one and if /when you had the infection  ( I would wait  at least 6- 9 mos since last shot ; longer since last infection.  (  We dont have clinical studies on  the boosters just antibody studies  can reduce risk of infection for 1-2 months  may be a good idea, before travel )     So  yes probably to RSV  and  up to you for  covid booster

## 2022-07-03 ENCOUNTER — Ambulatory Visit: Payer: Federal, State, Local not specified - PPO | Admitting: Psychology

## 2022-07-03 DIAGNOSIS — F331 Major depressive disorder, recurrent, moderate: Secondary | ICD-10-CM | POA: Diagnosis not present

## 2022-07-03 NOTE — Progress Notes (Signed)
[place] Counselor Initial Adult Exam  Name: Randall Torres Date: 07/03/2022 MRN: [id number] DOB: Oct 27, 1950 PCP: Burnis Medin, MD     Hendricks Limes participated from home, via video, and consented to treatment. Therapist participated from home office. We met online due to [place] pandemic.   Guardian/Payee:  N/A    Paperwork requested: No   Reason for Visit /Presenting Problem: Seeking to reduce symptoms of anxiety and depression  Mental Status Exam: Appearance:   Casual     Behavior:  Appropriate  Motor:  Normal  Speech/Language:   Normal Rate  Affect:  Appropriate  Mood:  normal  Thought process:  normal  Thought content:    WNL  Sensory/Perceptual disturbances:    WNL  Orientation:  oriented to person, place, and situation  Attention:  Good  Concentration:  Good  Memory:  WNL  Fund of knowledge:   Good  Insight:    Good  Judgment:   Good  Impulse Control:  Good    Reported Symptoms:  sadness, depleted self esteem, lack of motivation, agitation/worry (anxiety),  Risk Assessment: Danger to Self:  No Self-injurious Behavior: No Danger to Others: No Duty to Warn:no Physical Aggression / Violence:No  Access to Firearms a concern: No  Gang Involvement:No  Patient / guardian was educated about steps to take if suicide or homicide risk level increases between visits: yes While future psychiatric events cannot be accurately predicted, the patient does not currently require acute inpatient psychiatric care and does not currently meet [facility] involuntary commitment criteria.  Substance Abuse History: Current substance abuse: Yes     Past Psychiatric History:   Previous psychological history is significant for depression and alcohol abuse Outpatient Providers:unknown History of Psych Hospitalization: No  Psychological Testing:  unknown    Abuse History:  Victim of: No.,  N/A    Report needed:  No. Victim of Neglect:No. Perpetrator of  N/A   Witness / Exposure to Domestic Violence: No   Protective Services Involvement: No  Witness to [employer] Violence:  No   Family History:  Family History  Problem Relation Age of Onset   Diabetes Other    Other Other        CVA age 59   Other Other        sleep problems   Myasthenia gravis Brother 36       deceased   Hyperlipidemia Other    Alcohol abuse Other        Uncle   Other Mother    Stroke Father    Diabetes Father    Throat cancer Sister    Breast cancer Sister    Healthy Son    Neuropathy Neg Hx     Living situation: the patient lives with their spouse  Sexual Orientation: Straight  Relationship Status: married  Name of spouse / other:unknown If a parent, number of children / ages:2 boys, 39 and 80  Support Systems: spouse  Museum/gallery curator Stress:  No   Income/Employment/Disability: Actor:  unknown  Educational History: Education: post Forensic psychologist work or degree  Religion/Sprituality/World View: Catholic  Any cultural differences that may affect / interfere with treatment:  N/A  Recreation/Hobbies: exercise walking  Stressors: Other: retirement    Strengths: Supportive Relationships, Family, and Church  Barriers:  unknown   Legal History: Pending legal issue / charges: The patient has  no significant history of legal issues. History of legal issue / charges:  N/A  Medical History/Surgical History: reviewed Past Medical History:  Diagnosis Date   ADD (attention deficit disorder)    ADJUSTMENT DISORDER 09/29/2007   Qualifier: Diagnosis of  By: Regis Bill MD, Standley Brooking    BPH (benign prostatic hyperplasia)    has seen urologist   Hyperlipidemia    Inguinal hernia    bilateral   Personal history of colonic adenoma 06/30/2008   Personal history of COVID-04 October 2020   Rhinitis 12/31/2010    Past Surgical History:  Procedure Laterality Date    CYSTOSCOPY     INGUINAL HERNIA REPAIR Bilateral 10/18/2014   Procedure: LAPAROSCOPIC BILATERAL INGUINAL HERNIA REPAIR  ;  Surgeon: Fanny Skates, MD;  [facility];  Service: General;  Laterality: Bilateral;   INGUINAL HERNIA REPAIR Right 07/13/2020   Procedure: RIGHT INGUINAL HERNIA REPAIR;  Surgeon: Alphonsa Overall, MD;  Location: [place];  Service: General;  Laterality: Right;   INSERTION OF MESH Bilateral 10/18/2014   Procedure: INSERTION OF MESH;  Surgeon: Fanny Skates, MD;  [facility];  Service: General;  Laterality: Bilateral;   INSERTION OF MESH Right 07/13/2020   Procedure: INSERTION OF MESH;  Surgeon: Alphonsa Overall, MD;  Location: [place];  Service: General;  Laterality: Right;   KNEE ARTHROSCOPY     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     NON FORMULARY "flomax" per patient     simvastatin (ZOCOR) 40 MG tablet TAKE 1 TABLET(40 MG) BY MOUTH DAILY AT 6 PM 90 tablet 3   tamsulosin (FLOMAX) 0.4 MG CAPS capsule TAKE 1 CAPSULE(0.4 MG) BY MOUTH DAILY 30 capsule 5   vitamin B-12 (CYANOCOBALAMIN) 1000 MCG tablet Take 1,000 mcg by mouth daily.     Current Facility-Administered Medications  Medication Dose Route Frequency Provider Last Rate Last Admin   cyanocobalamin ((VITAMIN B-12)) injection 1,000 mcg  1,000 mcg Intramuscular Once Patel, Donika K, DO        No Known Allergies        Goals: Engage in outpatient psychotherapy to manage anxiety and reduce symptoms of depression (sadness, depleted esteem, low motivation). Will continue evaluation (goal date 9-31) and reduce reported symptoms (goal date 12-23). Diagnoses:  Depression, Gen Anxiety  Plan of Care: Outpatient Psychotherapy Session Notes: Since the last session, he had a week  with wife's family and then they went to [place] area to help take care of grandchildren. He met with psychiatrist for the first time since August. He was given the okay to take supplements. He has continued to avoid all psychiatric meds. He feels that he is more aware of the positives in his life. He tends to get very nervous or worried, but is realizing some improvements. States he hopes to be more grateful and have a more positive view of life. His wife is pleased he is off all of the medicine. He does periodically feel some anxiety and desires the Clonazepam.  Discussed his need and desire for ritual and routine, which helps keep him balanced. Suggested that he continue to structure his day and adhere to routine.  Marcelina Morel, PhD Time Spent: 4:10p-5:00p 50 minutes

## 2022-07-10 ENCOUNTER — Ambulatory Visit: Payer: Federal, State, Local not specified - PPO | Admitting: Psychology

## 2022-07-16 ENCOUNTER — Ambulatory Visit: Payer: Federal, State, Local not specified - PPO | Admitting: Psychology

## 2022-07-16 DIAGNOSIS — F331 Major depressive disorder, recurrent, moderate: Secondary | ICD-10-CM | POA: Diagnosis not present

## 2022-07-16 NOTE — Progress Notes (Signed)
[place] Counselor Initial Adult Exam  Name: Randall Torres Date: 07/16/2022 MRN: [id number] DOB: Mar 25, 1951 PCP: Burnis Medin, MD     Hendricks Limes participated from home, via video, and consented to treatment. Therapist participated from home office. We met online due to [place] pandemic.   Guardian/Payee:  N/A    Paperwork requested: No   Reason for Visit /Presenting Problem: Seeking to reduce symptoms of anxiety and depression  Mental Status Exam: Appearance:   Casual     Behavior:  Appropriate  Motor:  Normal  Speech/Language:   Normal Rate  Affect:  Appropriate  Mood:  normal  Thought process:  normal  Thought content:    WNL  Sensory/Perceptual disturbances:    WNL  Orientation:  oriented to person, place, and situation  Attention:  Good  Concentration:  Good  Memory:  WNL  Fund of knowledge:   Good  Insight:    Good  Judgment:   Good  Impulse Control:  Good    Reported Symptoms:  sadness, depleted self esteem, lack of motivation, agitation/worry (anxiety),  Risk Assessment: Danger to Self:  No Self-injurious Behavior: No Danger to Others: No Duty to Warn:no Physical Aggression / Violence:No  Access to Firearms a concern: No  Gang Involvement:No  Patient / guardian was educated about steps to take if suicide or homicide risk level increases between visits: yes While future psychiatric events cannot be accurately predicted, the patient does not currently require acute inpatient psychiatric care and does not currently meet [facility] involuntary commitment criteria.  Substance Abuse History: Current substance abuse: Yes     Past Psychiatric History:   Previous psychological history is significant for depression and alcohol abuse Outpatient Providers:unknown History of Psych Hospitalization: No  Psychological Testing:  unknown    Abuse History:  Victim of: No.,  N/A    Report needed: No. Victim of  Neglect:No. Perpetrator of  N/A   Witness / Exposure to Domestic Violence: No   Protective Services Involvement: No  Witness to [employer] Violence:  No   Family History:  Family History  Problem Relation Age of Onset   Diabetes Other    Other Other        CVA age 71   Other Other        sleep problems   Myasthenia gravis Brother 25       deceased   Hyperlipidemia Other    Alcohol abuse Other        Uncle   Other Mother    Stroke Father    Diabetes Father    Throat cancer Sister    Breast cancer Sister    Healthy Son    Neuropathy Neg Hx     Living situation: the patient lives with their spouse  Sexual Orientation: Straight  Relationship Status: married  Name of spouse / other:unknown If a parent, number of children / ages:2 boys, 64 and 17  Support Systems: spouse  Museum/gallery curator Stress:  No   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 History of legal issue / charges:  N/A  Medical History/Surgical History: reviewed Past Medical History:  Diagnosis Date   ADD (attention deficit disorder)    ADJUSTMENT DISORDER 09/29/2007   Qualifier: Diagnosis of  By: Regis Bill MD, Standley Brooking    BPH (benign prostatic hyperplasia)    has seen urologist   Hyperlipidemia    Inguinal hernia    bilateral   Personal history of colonic adenoma 06/30/2008   Personal history of COVID-04 October 2020   Rhinitis 12/31/2010    Past Surgical History:  Procedure Laterality Date   CYSTOSCOPY      INGUINAL HERNIA REPAIR Bilateral 10/18/2014   Procedure: LAPAROSCOPIC BILATERAL INGUINAL HERNIA REPAIR  ;  Surgeon: Fanny Skates, MD;  [facility];  Service: General;  Laterality: Bilateral;   INGUINAL HERNIA REPAIR Right 07/13/2020   Procedure: RIGHT INGUINAL HERNIA REPAIR;  Surgeon: Alphonsa Overall, MD;  Location: [place];  Service: General;  Laterality: Right;   INSERTION OF MESH Bilateral 10/18/2014   Procedure: INSERTION OF MESH;  Surgeon: Fanny Skates, MD;  [facility];  Service: General;  Laterality: Bilateral;   INSERTION OF MESH Right 07/13/2020   Procedure: INSERTION OF MESH;  Surgeon: Alphonsa Overall, MD;  Location: [place];  Service: General;  Laterality: Right;   KNEE ARTHROSCOPY     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     NON FORMULARY "flomax" per patient     simvastatin (ZOCOR) 40 MG tablet TAKE 1 TABLET(40 MG) BY MOUTH DAILY AT 6 PM 90 tablet 3   tamsulosin (FLOMAX) 0.4 MG CAPS capsule TAKE 1 CAPSULE(0.4 MG) BY MOUTH DAILY 30 capsule 5   vitamin B-12 (CYANOCOBALAMIN) 1000 MCG tablet Take 1,000 mcg by mouth daily.     Current Facility-Administered Medications  Medication Dose Route Frequency Provider Last Rate Last Admin   cyanocobalamin ((VITAMIN B-12)) injection 1,000 mcg  1,000 mcg Intramuscular Once Patel, Donika K, DO        No Known Allergies        Goals: Engage in outpatient psychotherapy to manage anxiety and reduce symptoms of depression (sadness, depleted esteem, low motivation). Will continue evaluation (goal date 9-31) and reduce reported symptoms (goal date 12-23). Diagnoses:  Depression, Gen Anxiety  Plan of Care: Outpatient Psychotherapy Session Notes: He states that he anticipates one more therapy session  with me and one more with the psychiatrist. He says that he was thinking "maybe it is time to make it on my own". Feels that at age 71, he needs to start making his own way. I told him an option is to stretch out the time between session to see how he does and he thought that is a great option for him.  He told a good friend, who is a male about some of his current struggles. He does not have a romantic relationship with her, but his wife got very upset with him for contacting her. We talked about how to manage that conversation with his wife. He has few friends and it is a loss to not be able to talk to this family friend. I coached him in a way to talk with wife, but he does not feel it is something she would receive well. Her reaction to him was so explosive that  he feels it would be an intolerable interaction. He is not willing to take that risk. He feels there are a number of topics that he cannot bring up with his wife.    Ronalee Belts is also struggling with his relationship with his psychiatrist, whom he likes very much. He is sensitive to being rejected when his psychiatrist talked about ending sessions because he is not on medicine any longer. We talked about normalizing his psychiatrist's response and being satisfied with the fact that he will remain available to [place] if needed.                     Marcelina Morel, PhD Time Spent: 8:40a-9:30a 50 minutes

## 2022-07-29 ENCOUNTER — Ambulatory Visit: Payer: Federal, State, Local not specified - PPO | Admitting: Psychology

## 2022-07-31 ENCOUNTER — Ambulatory Visit: Payer: Federal, State, Local not specified - PPO | Admitting: Psychology

## 2022-07-31 DIAGNOSIS — F331 Major depressive disorder, recurrent, moderate: Secondary | ICD-10-CM | POA: Diagnosis not present
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
## 2022-08-01 ENCOUNTER — Ambulatory Visit: Payer: Federal, State, Local not specified - PPO | Admitting: Psychology

## 2022-08-13 ENCOUNTER — Encounter: Payer: Self-pay | Admitting: Internal Medicine

## 2022-08-13 ENCOUNTER — Ambulatory Visit (INDEPENDENT_AMBULATORY_CARE_PROVIDER_SITE_OTHER): Payer: Federal, State, Local not specified - PPO | Admitting: Internal Medicine

## 2022-08-13 VITALS — BP 124/80 | HR 60 | Temp 97.7°F | Ht 68.0 in | Wt 174.4 lb

## 2022-08-13 DIAGNOSIS — Z79899 Other long term (current) drug therapy: Secondary | ICD-10-CM | POA: Diagnosis not present

## 2022-08-13 DIAGNOSIS — E785 Hyperlipidemia, unspecified: Secondary | ICD-10-CM

## 2022-08-13 DIAGNOSIS — Z0000 Encounter for general adult medical examination without abnormal findings: Secondary | ICD-10-CM

## 2022-08-13 DIAGNOSIS — G629 Polyneuropathy, unspecified: Secondary | ICD-10-CM | POA: Diagnosis not present

## 2022-08-13 DIAGNOSIS — N401 Enlarged prostate with lower urinary tract symptoms: Secondary | ICD-10-CM

## 2022-08-13 LAB — BASIC METABOLIC PANEL
BUN: 19 mg/dL (ref 6–23)
CO2: 29 mEq/L (ref 19–32)
Calcium: 9.3 mg/dL (ref 8.4–10.5)
Chloride: 102 mEq/L (ref 96–112)
Creatinine, Ser: 0.77 mg/dL (ref 0.40–1.50)
GFR: 89.11 mL/min (ref >60.00)
Glucose, Bld: 96 mg/dL (ref 70–99)
Potassium: 4.5 mEq/L (ref 3.5–5.1)
Sodium: 140 mEq/L (ref 135–145)

## 2022-08-13 LAB — CBC WITH DIFFERENTIAL/PLATELET
Basophils Absolute: 0.1 10*3/uL (ref 0.0–0.1)
Basophils Relative: 0.9 % (ref 0.0–3.0)
Eosinophils Absolute: 0.2 10*3/uL (ref 0.0–0.7)
Eosinophils Relative: 3.2 % (ref 0.0–5.0)
HCT: 41.6 % (ref 39.0–52.0)
Hemoglobin: 14 g/dL (ref 13.0–17.0)
Lymphocytes Relative: 30.3 % (ref 12.0–46.0)
Lymphs Abs: 2 10*3/uL (ref 0.7–4.0)
MCHC: 33.8 g/dL (ref 30.0–36.0)
MCV: 88.9 fl (ref 78.0–100.0)
Monocytes Absolute: 0.5 10*3/uL (ref 0.1–1.0)
Monocytes Relative: 7.5 % (ref 3.0–12.0)
Neutro Abs: 3.8 10*3/uL (ref 1.4–7.7)
Neutrophils Relative %: 58.1 % (ref 43.0–77.0)
Platelets: 300 10*3/uL (ref 150.0–400.0)
RBC: 4.68 Mil/uL (ref 4.22–5.81)
RDW: 13.7 % (ref 11.5–15.5)
WBC: 6.5 10*3/uL (ref 4.0–10.5)

## 2022-08-13 LAB — LIPID PANEL
Cholesterol: 173 mg/dL (ref 0–200)
HDL: 59.1 mg/dL (ref >39.00)
LDL Cholesterol: 103 mg/dL — ABNORMAL HIGH (ref 0–99)
NonHDL: 113.62
Total CHOL/HDL Ratio: 3
Triglycerides: 52 mg/dL (ref 0.0–149.0)
VLDL: 10.4 mg/dL (ref 0.0–40.0)

## 2022-08-13 LAB — HEPATIC FUNCTION PANEL
ALT: 25 U/L (ref 0–53)
AST: 33 U/L (ref 0–37)
Albumin: 4.3 g/dL (ref 3.5–5.2)
Alkaline Phosphatase: 55 U/L (ref 39–117)
Bilirubin, Direct: 0.1 mg/dL (ref 0.0–0.3)
Total Bilirubin: 0.7 mg/dL (ref 0.2–1.2)
Total Protein: 7.5 g/dL (ref 6.0–8.3)

## 2022-08-13 LAB — PSA: PSA: 0.72 ng/mL (ref 0.10–4.00)

## 2022-08-13 NOTE — Progress Notes (Signed)
Chief Complaint  Patient presents with   Annual Exam    HPI: Patient  Randall Torres  71 y.o. comes in today for Preventive Health Care visit  and med check   Aug 15 off psych  meds .      Family felt he wasn't as joking . Since then doin ok and family pleased  still followed  by Psych . xercising more  helpful  HLD: on simvastattin Urology : flomax fir nocturi  No new neuro sx foot drop etc  off b12 suppl  and doing ok  Health Maintenance  Topic Date Due   COVID-19 Vaccine (7 - 2023-24 season) 08/12/2022   COLONOSCOPY (Pts 45-26yrs Insurance coverage will need to be confirmed)  08/03/2028   Pneumonia Vaccine 52+ Years old  Completed   INFLUENZA VACCINE  Completed   Hepatitis C Screening  Completed   Zoster Vaccines- Shingrix  Completed   HPV VACCINES  Aged Out  Had rsv covid booster and flu vaccine updated this year Health Maintenance Review LIFESTYLE:  Exercise:  walka a lot   at least  15 k per day plus   Tobacco/ETS: Alcohol:  Sugar beverages: Sleep:  about 7 hours   nocturia  Drug use: no HH of  2    ROS:  REST of 12 system review negative except as per HPI n pulm sx    Past Medical History:  Diagnosis Date   ADD (attention deficit disorder)    ADJUSTMENT DISORDER 09/29/2007   Qualifier: Diagnosis of  By: Fabian Sharp MD, Neta Mends    BPH (benign prostatic hyperplasia)    has seen urologist   Hyperlipidemia    Inguinal hernia    bilateral   Personal history of colonic adenoma 06/30/2008   Personal history of COVID-04 October 2020   Rhinitis 12/31/2010    Past Surgical History:  Procedure Laterality Date   CYSTOSCOPY     INGUINAL HERNIA REPAIR Bilateral 10/18/2014   Procedure: LAPAROSCOPIC BILATERAL INGUINAL HERNIA REPAIR  ;  Surgeon: Claud Kelp, MD;  [facility];  Service: General;  Laterality: Bilateral;   INGUINAL HERNIA REPAIR Right 07/13/2020   Procedure: RIGHT INGUINAL HERNIA REPAIR;  Surgeon: Ovidio Kin, MD;  Location: [facility];  Service: General;  Laterality: Right;   INSERTION OF MESH Bilateral 10/18/2014   Procedure: INSERTION OF MESH;  Surgeon: Claud Kelp, MD;  [facility];  Service: General;  Laterality: Bilateral;   INSERTION OF MESH Right 07/13/2020   Procedure: INSERTION OF MESH;  Surgeon: Ovidio Kin, MD;  Location: [facility];  Service: General;  Laterality: Right;   KNEE ARTHROSCOPY     TOTAL KNEE ARTHROPLASTY  11/08   medial compartmental     Family History  Problem Relation Age of Onset   Diabetes Other    Other Other        CVA age 66   Other Other        sleep problems   Myasthenia gravis Brother 32       deceased   Hyperlipidemia Other    Alcohol abuse Other        Uncle   Other Mother    Stroke Father    Diabetes Father    Throat cancer Sister    Breast cancer Sister    Healthy Son    Neuropathy Neg Hx     Social History   Socioeconomic History   Marital status: Married  Spouse name: Not on file   Number of children: Not on file   Years of education: Not on file   Highest education level: Not on file  Occupational History   Not on file  Tobacco Use   Smoking status: Never   Smokeless tobacco: Never  Vaping Use   Vaping Use: Never used  Substance and Sexual Activity   Alcohol use: No    Alcohol/week: 0.0 standard drinks of alcohol    Comment: Hx of alcohol abuse - none since 2010   Drug use: No   Sexual activity: Not on file  Other Topics Concern   Not on file  Social History Narrative   Occupation: Publishing rights manager. retired 2021   Highest level of education: law school   Married   Exercise: Swims daily  5-6 days per week.    Goes to AA  5 day per week or so.      HH of 2 dog     Wife x for breast cancer    2 children.   Lives with wife in a 2 story home.    Social Determinants of Health   Financial Resource Strain: Not on file  Food Insecurity: Not on file  Transportation Needs: Not on file  Physical Activity:  Not on file  Stress: Not on file  Social Connections: Not on file    Outpatient Medications Prior to Visit  Medication Sig Dispense Refill   NON FORMULARY "flomax" per patient     simvastatin (ZOCOR) 40 MG tablet TAKE 1 TABLET(40 MG) BY MOUTH DAILY AT 6 PM 90 tablet 3   tamsulosin (FLOMAX) 0.4 MG CAPS capsule TAKE 1 CAPSULE(0.4 MG) BY MOUTH DAILY 30 capsule 5   clonazePAM (KLONOPIN) 0.5 MG tablet   0   DULoxetine (CYMBALTA) 60 MG capsule Take 60 mg by mouth daily.     vitamin B-12 (CYANOCOBALAMIN) 1000 MCG tablet Take 1,000 mcg by mouth daily.     cyanocobalamin ((VITAMIN B-12)) injection 1,000 mcg      No facility-administered medications prior to visit.     EXAM:  BP 124/80 (BP Location: Right Arm, Patient Position: Sitting, Cuff Size: Normal)   Pulse 60   Temp 97.7 F (36.5 C) (Oral)   Ht 5\' 8"  (1.727 m)   Wt 174 lb 6.4 oz (79.1 kg)   SpO2 98%   BMI 26.52 kg/m   Body mass index is 26.52 kg/m. Wt Readings from Last 3 Encounters:  08/13/22 174 lb 6.4 oz (79.1 kg)  08/07/21 175 lb (79.4 kg)  03/12/21 172 lb 3.2 oz (78.1 kg)    Physical Exam: Vital signs reviewed 03/14/21 is a well-developed well-nourished alert cooperative    who appearsr stated age in no acute distress.  HEENT: normocephalic atraumatic , Eyes: PERRL EOM's full, conjunctiva clear, Nares: paten,t no deformity discharge or tenderness., Ears: no deformity EAC's clear TMs with normal landmarks. Mouth: clear OP, no lesions, edema.  Moist mucous membranes. Dentition in adequate repair. NECK: supple without masses, thyromegaly or bruits. CHEST/PULM:  Clear to auscultation and percussion breath sounds equal no wheeze , rales or rhonchi. No chest wall deformities or tenderness. CV: PMI is nondisplaced, S1 S2 no gallops, murmurs, rubs. Peripheral pulses are full without delay.No JVD .  ABDOMEN: Bowel sounds normal nontender  No guard or rebound, no hepato splenomegal no CVA tenderness.  . Extremtities:  No  clubbing cyanosis or edema, no acute joint swelling or redness no focal atrophy thckened toenails  NEURO:  Oriented x3, cranial nerves 3-12 appear to be intact, no obvious focal weakness,gait within normal limits no abnormal reflexes SKIN: No acute rashes normal turgor, color, no bruising or petechiae. PSYCH: Oriented, good eye contact, no obvious depression anxiety, cognition and judgment appear normal. LN: no cervical axillary adenopathy  Lab Results  Component Value Date   WBC 7.5 08/07/2021   HGB 13.2 08/07/2021   HCT 39.9 08/07/2021   PLT 302.0 08/07/2021   GLUCOSE 90 08/07/2021   CHOL 169 08/07/2021   TRIG 67.0 08/07/2021   HDL 62.50 08/07/2021   LDLDIRECT 147.2 11/29/2009   LDLCALC 93 08/07/2021   ALT 21 08/07/2021   AST 32 08/07/2021   NA 138 08/07/2021   K 4.2 08/07/2021   CL 100 08/07/2021   CREATININE 0.81 08/07/2021   BUN 17 08/07/2021   CO2 32 08/07/2021   TSH 1.37 08/07/2021   PSA 0.91 08/07/2021   INR 1.0 03/12/2021   HGBA1C 6.1 08/07/2021   Lab Results  Component Value Date   VITAMINB12 1,336 (H) 08/07/2021    BP Readings from Last 3 Encounters:  08/13/22 124/80  08/07/21 134/70  03/12/21 126/70    Lab plan fasting  reviewed with patient   ASSESSMENT AND PLAN:  Discussed the following assessment and plan:    ICD-10-CM   1. Visit for preventive health examination  Z00.00     2. Hyperlipidemia, unspecified hyperlipidemia type  E78.5 Basic metabolic panel    CBC with Differential/Platelet    Hepatic function panel    Lipid panel    PSA    PSA    Lipid panel    Hepatic function panel    CBC with Differential/Platelet    Basic metabolic panel    3. Medication management  Z79.899 Basic metabolic panel    CBC with Differential/Platelet    Hepatic function panel    Lipid panel    PSA    PSA    Lipid panel    Hepatic function panel    CBC with Differential/Platelet    Basic metabolic panel    4. Benign prostatic hyperplasia with lower  urinary tract symptoms, symptom details unspecified  N40.1 Basic metabolic panel    CBC with Differential/Platelet    Hepatic function panel    Lipid panel    PSA    PSA    Lipid panel    Hepatic function panel    CBC with Differential/Platelet    Basic metabolic panel    5. Neuropathy symptoms better  G62.9 Basic metabolic panel    CBC with Differential/Platelet    Hepatic function panel    Lipid panel    PSA    PSA    Lipid panel    Hepatic function panel    CBC with Differential/Platelet    Basic metabolic panel    Continue lifestyle intervention healthy eating and exercise .  Update lab today Yearly check  Return in about 1 year (around 08/14/2023) for depending on results.  Patient Care Team: Chrisma Hurlock, Neta Mends, MD as PCP - General Clarene Duke (Psychiatry) Glendale Chard, DO as Consulting Physician (Neurology) Patient Instructions  Good to see you today .Marland KitchenGlad you are doing well. Lab today  Yearly check  Continue lifestyle intervention healthy eating and exercise .    Neta Mends. Ivonne [place]burg M.D.

## 2022-08-13 NOTE — Patient Instructions (Addendum)
Good to see you today .Marland KitchenGlad you are doing well. Lab today  Yearly check  Continue lifestyle intervention healthy eating and exercise .

## 2022-08-14 NOTE — Progress Notes (Signed)
Results normal or stable

## 2022-09-04 ENCOUNTER — Ambulatory Visit: Payer: Federal, State, Local not specified - PPO | Admitting: Psychology

## 2022-09-04 DIAGNOSIS — F331 Major depressive disorder, recurrent, moderate: Secondary | ICD-10-CM

## 2022-09-04 NOTE — Progress Notes (Signed)
[facility] Counselor Initial Adult Exam  Name: Randall Torres Date: 09/04/2022 MRN: [id number] DOB: 1951/02/12 PCP: Burnis Medin, MD     Hendricks Limes participated from home, via video, and consented to treatment. Therapist participated from home office. We met online due to [place] pandemic.   Guardian/Payee:  N/A    Paperwork requested: No   Reason for Visit /Presenting Problem: Seeking to reduce symptoms of anxiety and depression  Mental Status Exam: Appearance:   Casual     Behavior:  Appropriate  Motor:  Normal  Speech/Language:   Normal Rate  Affect:  Appropriate  Mood:  normal  Thought process:  normal  Thought content:    WNL  Sensory/Perceptual disturbances:    WNL  Orientation:  oriented to person, place, and situation  Attention:  Good  Concentration:  Good  Memory:  WNL  Fund of knowledge:   Good  Insight:    Good  Judgment:   Good  Impulse Control:  Good    Reported Symptoms:  sadness, depleted self esteem, lack of motivation, agitation/worry (anxiety),  Risk Assessment: Danger to Self:  No Self-injurious Behavior: No Danger to Others: No Duty to Warn:no Physical Aggression / Violence:No  Access to Firearms a concern: No  Gang Involvement:No  Patient / guardian was educated about steps to take if suicide or homicide risk level increases between visits: yes While future psychiatric events cannot be accurately predicted, the patient does not currently require acute inpatient psychiatric care and does not currently meet [facility] involuntary commitment criteria.  Substance Abuse History: Current substance abuse: Yes     Past Psychiatric History:   Previous psychological history is significant for depression and alcohol abuse Outpatient Providers:unknown History of Psych Hospitalization: No  Psychological Testing:  unknown    Abuse History:  Victim of: No.,  N/A    Report needed:  No. Victim of Neglect:No. Perpetrator of  N/A   Witness / Exposure to Domestic Violence: No   Protective Services Involvement: No  Witness to [employer] Violence:  No   Family History:  Family History  Problem Relation Age of Onset   Diabetes Other    Other Other        CVA age 90   Other Other        sleep problems   Myasthenia gravis Brother 51       deceased   Hyperlipidemia Other    Alcohol abuse Other        Uncle   Other Mother    Stroke Father    Diabetes Father    Throat cancer Sister    Breast cancer Sister    Healthy Son    Neuropathy Neg Hx     Living situation: the patient lives with their spouse  Sexual Orientation: Straight  Relationship Status: married  Name of spouse / other:unknown If a parent, number of children / ages:2 boys, 72 and 57  Support Systems: spouse  Museum/gallery curator Stress:  No   Income/Employment/Disability: Actor:  unknown  Educational History: Education: post Forensic psychologist work or degree  Religion/Sprituality/World View: Catholic  Any cultural differences that may affect / interfere with treatment:  N/A  Recreation/Hobbies: exercise walking  Stressors: Other: retirement    Strengths: Supportive Relationships, Family, and Church  Barriers:  unknown   Legal History: Pending legal issue / charges: The patient has  no significant history of legal issues. History of legal issue / charges:  N/A  Medical History/Surgical History: reviewed Past Medical History:  Diagnosis Date   ADD (attention deficit disorder)    ADJUSTMENT DISORDER 09/29/2007   Qualifier: Diagnosis of  By: Regis Bill MD, Standley Brooking    BPH (benign prostatic hyperplasia)    has seen urologist   Hyperlipidemia    Inguinal hernia    bilateral   Personal history of colonic adenoma 06/30/2008   Personal history of COVID-04 October 2020   Rhinitis 12/31/2010    Past Surgical History:  Procedure Laterality Date    CYSTOSCOPY     INGUINAL HERNIA REPAIR Bilateral 10/18/2014   Procedure: LAPAROSCOPIC BILATERAL INGUINAL HERNIA REPAIR  ;  Surgeon: Fanny Skates, MD;  [facility];  Service: General;  Laterality: Bilateral;   INGUINAL HERNIA REPAIR Right 07/13/2020   Procedure: RIGHT INGUINAL HERNIA REPAIR;  Surgeon: Alphonsa Overall, MD;  Location: [place];  Service: General;  Laterality: Right;   INSERTION OF MESH Bilateral 10/18/2014   Procedure: INSERTION OF MESH;  Surgeon: Fanny Skates, MD;  [facility];  Service: General;  Laterality: Bilateral;   INSERTION OF MESH Right 07/13/2020   Procedure: INSERTION OF MESH;  Surgeon: Alphonsa Overall, MD;  Location: [place];  Service: General;  Laterality: Right;   KNEE ARTHROSCOPY     TOTAL KNEE ARTHROPLASTY  11/08   medial compartmental     Medications: Current Outpatient Medications  Medication Sig Dispense Refill   NON FORMULARY "flomax" per patient     simvastatin (ZOCOR) 40 MG tablet TAKE 1 TABLET(40 MG) BY MOUTH DAILY AT 6 PM 90 tablet 3   tamsulosin (FLOMAX) 0.4 MG CAPS capsule TAKE 1 CAPSULE(0.4 MG) BY MOUTH DAILY 30 capsule 5   No current facility-administered medications for this visit.    No Known Allergies        Goals: Engage in outpatient psychotherapy to manage anxiety and reduce symptoms of depression (sadness, depleted esteem, low motivation). Will continue evaluation (goal date 9-31) and reduce reported symptoms (goal date 6-24). Diagnoses:  Depression, Gen Anxiety  Plan of Care: Outpatient Psychotherapy Session Notes: He says he has been preparing for the holiday. He relayed some experiences he had that triggered some old wounds. He was upset that he let himself get distressed over perceived criticism. He is over-sensitive and tends to "blow things out of proportion. We talked about his anxieties and how he can manage in order to have a good holiday. We talked about have realistic expectations and  how to be more "present" with family and friends. Will meet again after the holiday.                              Marcelina Morel, PhD Time Spent: 11:35a-12:30p 55 minutes

## 2022-09-06 ENCOUNTER — Telehealth: Payer: Self-pay

## 2022-09-06 NOTE — Telephone Encounter (Signed)
Received faxed document from [facility].  Put in red folder for provider to review.  Last OV 08/13/22

## 2022-09-06 NOTE — Telephone Encounter (Signed)
Forms faxed to [facility] at [phone]; confirmation received.

## 2022-09-30 ENCOUNTER — Other Ambulatory Visit: Payer: Self-pay | Admitting: Orthopedic Surgery

## 2022-10-02 ENCOUNTER — Ambulatory Visit (INDEPENDENT_AMBULATORY_CARE_PROVIDER_SITE_OTHER): Payer: Federal, State, Local not specified - PPO | Admitting: Psychology

## 2022-10-02 ENCOUNTER — Ambulatory Visit: Payer: Federal, State, Local not specified - PPO | Admitting: Psychology

## 2022-10-02 DIAGNOSIS — F331 Major depressive disorder, recurrent, moderate: Secondary | ICD-10-CM | POA: Diagnosis not present

## 2022-10-02 NOTE — Progress Notes (Signed)
Comfort Counselor Initial Adult Exam  Name: Randall Torres Date: 10/02/2022 MRN: [id number] DOB: [birth year] PCP: Burnis Medin, MD      Guardian/Payee:  N/A    Paperwork requested: No   Reason for Visit /Presenting Problem: Seeking to reduce symptoms of anxiety and depression  Mental Status Exam: Appearance:   Casual     Behavior:  Appropriate  Motor:  Normal  Speech/Language:   Normal Rate  Affect:  Appropriate  Mood:  normal  Thought process:  normal  Thought content:    WNL  Sensory/Perceptual disturbances:    WNL  Orientation:  oriented to person, place, and situation  Attention:  Good  Concentration:  Good  Memory:  WNL  Fund of knowledge:   Good  Insight:    Good  Judgment:   Good  Impulse Control:  Good    Reported Symptoms:  sadness, depleted self esteem, lack of motivation, agitation/worry (anxiety),  Risk Assessment: Danger to Self:  No Self-injurious Behavior: No Danger to Others: No Duty to Warn:no Physical Aggression / Violence:No  Access to Firearms a concern: No  Gang Involvement:No  Patient / guardian was educated about steps to take if suicide or homicide risk level increases between visits: yes While future psychiatric events cannot be accurately predicted, the patient does not currently require acute inpatient psychiatric care and does not currently meet [facility] involuntary commitment criteria.  Substance Abuse History: Current substance abuse: Yes     Past Psychiatric History:   Previous psychological history is significant for depression and alcohol abuse Outpatient Providers:unknown History of Psych Hospitalization: No  Psychological Testing:  unknown    Abuse History:  Victim of: No.,  N/A    Report needed: No. Victim of Neglect:No. Perpetrator of  N/A   Witness / Exposure to Domestic Violence: No   Protective Services Involvement: No  Witness to [employer] Violence:  No   Family History:   Family History  Problem Relation Age of Onset   Diabetes Other    Other Other        CVA age 74   Other Other        sleep problems   Myasthenia gravis Brother 74       deceased   Hyperlipidemia Other    Alcohol abuse Other        Uncle   Other Mother    Stroke Father    Diabetes Father    Throat cancer Sister    Breast cancer Sister    Healthy Son    Neuropathy Neg Hx     Living situation: the patient lives with their spouse  Sexual Orientation: Straight  Relationship Status: married  Name of spouse / other:unknown If a parent, number of children / ages:2 boys, 51 and 74  Support Systems: spouse  Museum/gallery curator Stress:  No   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 History of legal issue / charges:  N/A  Medical History/Surgical History: reviewed Past Medical History:  Diagnosis Date   ADD (attention deficit disorder)    ADJUSTMENT DISORDER 09/29/2007  Qualifier: Diagnosis of  By: Regis Bill MD, Standley Brooking    BPH (benign prostatic hyperplasia)    has seen urologist   Hyperlipidemia    Inguinal hernia    bilateral   Personal history of colonic adenoma 06/30/2008   Personal history of COVID-04 October 2020   Rhinitis 12/31/2010    Past Surgical History:  Procedure Laterality Date   CYSTOSCOPY     INGUINAL HERNIA REPAIR Bilateral 10/18/2014   Procedure: LAPAROSCOPIC BILATERAL INGUINAL HERNIA REPAIR  ;  Surgeon: Fanny Skates, MD;  Location: [facility];  Service: General;   Laterality: Bilateral;   INGUINAL HERNIA REPAIR Right 07/13/2020   Procedure: RIGHT INGUINAL HERNIA REPAIR;  Surgeon: Alphonsa Overall, MD;  Location: [place];  Service: General;  Laterality: Right;   INSERTION OF MESH Bilateral 10/18/2014   Procedure: INSERTION OF MESH;  Surgeon: Fanny Skates, MD;  [facility];  Service: General;  Laterality: Bilateral;   INSERTION OF MESH Right 07/13/2020   Procedure: INSERTION OF MESH;  Surgeon: Alphonsa Overall, MD;  Location: [place];  Service: General;  Laterality: Right;   KNEE ARTHROSCOPY     TOTAL KNEE ARTHROPLASTY  11/08   medial compartmental     Medications: Current Outpatient Medications  Medication Sig Dispense Refill   NON FORMULARY "flomax" per patient     simvastatin (ZOCOR) 40 MG tablet TAKE 1 TABLET(40 MG) BY MOUTH DAILY AT 6 PM 90 tablet 3   tamsulosin (FLOMAX) 0.4 MG CAPS capsule TAKE 1 CAPSULE(0.4 MG) BY MOUTH DAILY 30 capsule 5   No current facility-administered medications for this visit.    No Known Allergies        Goals: Engage in outpatient psychotherapy to manage anxiety and reduce symptoms of depression (sadness, depleted esteem, low motivation). Will continue evaluation (goal date 9-31) and reduce reported symptoms (goal date 6-24). Diagnoses:  Depression, Gen Anxiety  Plan of Care: Outpatient Psychotherapy Patient was seen in the provider's office for session. Session Notes: He talked about the fact that he is "thin skinned" and how his feelings get hurt. He was upset by the comments of a woman at church, but, as we predicted, it worked itself out and he feels better. He talked about the ongoing benefits of going to [employer]. He goes about 2 times/week.  He says the psychiatrist has "turned him loose" and said it has been 6 months, so he no longer needs to be concerned about being off of his medication. He and his wife are going on a river cruise to [place].  Discussed his need to  be willing to accept all of life's positive and negative experiences. Was at a funeral this week and it brought up feelings about his own mortality. He finds this overwhelming at times, as he comes to terms with his own aging.                                  Marcelina Morel, PhD Time Spent: 3:15p-4:00p 45 minutes

## 2022-10-14 NOTE — Progress Notes (Signed)
COVID Vaccine Completed: yes  Date of COVID positive in last 45 days:no  PCP - Shanon Ace, MD Cardiologist - n/a  Chest x-ray - 10/15/22 Epic EKG - 10/15/22 Epic/chart Stress Test - long time ago per pt ECHO - n/a Cardiac Cath - n/a Pacemaker/ICD device last checked: n/a Spinal Cord Stimulator: n/a  Bowel Prep - no  Sleep Study - n/a CPAP -   Fasting Blood Sugar - n/a Checks Blood Sugar _____ times a day  Last dose of GLP1 agonist-  N/A GLP1 instructions:  N/A   Last dose of SGLT-2 inhibitors-  N/A SGLT-2 instructions: N/A   Blood Thinner Instructions: n/a Aspirin Instructions: Last Dose:  Activity level: Can go up a flight of stairs and perform activities of daily living without stopping and without symptoms of chest pain or shortness of breath.   Anesthesia review:   Patient denies shortness of breath, fever, cough and chest pain at PAT appointment  Patient verbalized understanding of instructions that were given to them at the PAT appointment. Patient was also instructed that they will need to review over the PAT instructions again at home before surgery.

## 2022-10-14 NOTE — Patient Instructions (Addendum)
SURGICAL WAITING ROOM VISITATION  Patients having surgery or a procedure may have no more than 2 support people in the waiting area - these visitors may rotate.    Children under the age of 56 must have an adult with them who is not the patient.  Due to an increase in RSV and influenza rates and associated hospitalizations, children ages 83 and under may not visit patients in [place].  If the patient needs to stay at the hospital during part of their recovery, the visitor guidelines for inpatient rooms apply. Pre-op nurse will coordinate an appropriate time for 1 support person to accompany patient in pre-op.  This support person may not rotate.    Please refer to the [facility] website for the visitor guidelines for Inpatients (after your surgery is over and you are in a regular room).    Your procedure is scheduled on: 10/28/22   Report to [facility] Main Entrance    Report to admitting at 5:15 AM   Call this number if you have problems the morning of surgery [phone]   Do not eat food :After Midnight.   After Midnight you may have the following liquids until 4:15 AM DAY OF SURGERY  Water Non-Citrus Juices (without pulp, NO RED-Apple, White grape, White cranberry) Black Coffee (NO MILK/CREAM OR CREAMERS, sugar ok)  Clear Tea (NO MILK/CREAM OR CREAMERS, sugar ok) regular and decaf                             Plain Jell-O (NO RED)                                           Fruit ices (not with fruit pulp, NO RED)                                     Popsicles (NO RED)                                                               Sports drinks like Gatorade (NO RED)               The day of surgery:  Drink ONE (1) Pre-Surgery Clear Ensure at 4:15 AM the morning of surgery. Drink in one sitting. Do not sip.  This drink was given to you during your hospital  pre-op appointment visit. Nothing else to drink after completing the  Pre-Surgery Clear Ensure.           If you have questions, please contact your surgeon's office.   FOLLOW BOWEL PREP AND ANY ADDITIONAL PRE OP INSTRUCTIONS YOU RECEIVED FROM YOUR SURGEON'S OFFICE!!!     Oral Hygiene is also important to reduce your risk of infection.                                    Remember - BRUSH YOUR TEETH THE MORNING OF SURGERY WITH YOUR REGULAR TOOTHPASTE  DENTURES WILL BE REMOVED  PRIOR TO SURGERY PLEASE DO NOT APPLY "Poly grip" OR ADHESIVES!!!   Take these medicines the morning of surgery with A SIP OF WATER: Simvastatin, Tamsulosin                               You may not have any metal on your body including jewelry, and body piercing             Do not wear lotions, powders, cologne, or deodorant  Do not shave  48 hours prior to surgery.               Men may shave face and neck.   Do not bring valuables to the hospital. [place].   Contacts, glasses, dentures or bridgework may not be worn into surgery.  DO NOT [place]. PHARMACY WILL DISPENSE MEDICATIONS LISTED ON YOUR MEDICATION LIST TO YOU DURING YOUR ADMISSION [place]!    Patients discharged on the day of surgery will not be allowed to drive home.  Someone NEEDS to stay with you for the first 24 hours after anesthesia.   Special Instructions: Bring a copy of your healthcare power of attorney and living will documents the day of surgery if you haven't scanned them before.              Please read over the following fact sheets you were given: IF Randall Torres [name]    If you received a COVID test during your pre-op visit  it is requested that you wear a mask when out in public, stay away from anyone that may not be feeling well and notify your surgeon if you develop symptoms. If you test positive for Covid or have been in contact with anyone that has tested positive in  the last 10 days please notify you surgeon.    Randall Torres - Preparing for Surgery Before surgery, you can play an important role.  Because skin is not sterile, your skin needs to be as free of germs as possible.  You can reduce the number of germs on your skin by washing with CHG (chlorahexidine gluconate) soap before surgery.  CHG is an antiseptic cleaner which kills germs and bonds with the skin to continue killing germs even after washing. Please DO NOT use if you have an allergy to CHG or antibacterial soaps.  If your skin becomes reddened/irritated stop using the CHG and inform your nurse when you arrive at Short Stay. Do not shave (including legs and underarms) for at least 48 hours prior to the first CHG shower.  You may shave your face/neck.  Please follow these instructions carefully:  1.  Shower with CHG Soap the night before surgery and the  morning of surgery.  2.  If you choose to wash your hair, wash your hair first as usual with your normal  shampoo.  3.  After you shampoo, rinse your hair and body thoroughly to remove the shampoo.                             4.  Use CHG as you would any other liquid soap.  You can apply chg directly to the skin and wash.  Gently  with a scrungie or clean washcloth.  5.  Apply the CHG Soap to your body ONLY FROM THE NECK DOWN.   Do   not use on face/ open                           Wound or open sores. Avoid contact with eyes, ears mouth and   genitals (private parts).                       Wash face,  Genitals (private parts) with your normal soap.             6.  Wash thoroughly, paying special attention to the area where your    surgery  will be performed.  7.  Thoroughly rinse your body with warm water from the neck down.  8.  DO NOT shower/wash with your normal soap after using and rinsing off the CHG Soap.                9.  Pat yourself dry with a clean towel.            10.  Wear clean pajamas.            11.  Place clean sheets on your bed  the night of your first shower and do not  sleep with pets. Day of Surgery : Do not apply any lotions/deodorants the morning of surgery.  Please wear clean clothes to the hospital/surgery center.  FAILURE TO FOLLOW THESE INSTRUCTIONS MAY RESULT IN THE CANCELLATION OF YOUR SURGERY  PATIENT SIGNATURE_________________________________  NURSE SIGNATURE__________________________________  ________________________________________________________________________  Randall Torres  An incentive spirometer is a tool that can help keep your lungs clear and active. This tool measures how well you are filling your lungs with each breath. Taking long deep breaths may help reverse or decrease the chance of developing breathing (pulmonary) problems (especially infection) following: A long period of time when you are unable to move or be active. BEFORE THE PROCEDURE  If the spirometer includes an indicator to show your best effort, your nurse or respiratory therapist will set it to a desired goal. If possible, sit up straight or lean slightly forward. Try not to slouch. Hold the incentive spirometer in an upright position. INSTRUCTIONS FOR USE  Sit on the edge of your bed if possible, or sit up as far as you can in bed or on a chair. Hold the incentive spirometer in an upright position. Breathe out normally. Place the mouthpiece in your mouth and seal your lips tightly around it. Breathe in slowly and as deeply as possible, raising the piston or the ball toward the top of the column. Hold your breath for 3-5 seconds or for as long as possible. Allow the piston or ball to fall to the bottom of the column. Remove the mouthpiece from your mouth and breathe out normally. Rest for a few seconds and repeat Steps 1 through 7 at least 10 times every 1-2 hours when you are awake. Take your time and take a few normal breaths between deep breaths. The spirometer may include an indicator to show your best effort.  Use the indicator as a goal to work toward during each repetition. After each set of 10 deep breaths, practice coughing to be sure your lungs are clear. If you have an incision (the cut made at the time of surgery), support your incision when coughing by placing a  pillow or rolled up towels firmly against it. Once you are able to get out of bed, walk around indoors and cough well. You may stop using the incentive spirometer when instructed by your caregiver.  RISKS AND COMPLICATIONS Take your time so you do not get dizzy or light-headed. If you are in pain, you may need to take or ask for pain medication before doing incentive spirometry. It is harder to take a deep breath if you are having pain. AFTER USE Rest and breathe slowly and easily. It can be helpful to keep track of a log of your progress. Your caregiver can provide you with a simple table to help with this. If you are using the spirometer at home, follow these instructions: [place] IF:  You are having difficultly using the spirometer. You have trouble using the spirometer as often as instructed. Your pain medication is not giving enough relief while using the spirometer. You develop fever of 100.5 F (38.1 C) or higher. SEEK IMMEDIATE MEDICAL CARE IF:  You cough up bloody sputum that had not been present before. You develop fever of 102 F (38.9 C) or greater. You develop worsening pain at or near the incision site. MAKE SURE YOU:  Understand these instructions. Will watch your condition. Will get help right away if you are not doing well or get worse. Document Released: 01/13/2007 Document Revised: 11/25/2011 Document Reviewed: 03/16/2007 [facility] Patient Information 2014 [place], [place].   ________________________________________________________________________

## 2022-10-15 ENCOUNTER — Encounter (HOSPITAL_COMMUNITY): Payer: Self-pay

## 2022-10-15 ENCOUNTER — Ambulatory Visit (HOSPITAL_COMMUNITY)
Admission: RE | Admit: 2022-10-15 | Discharge: 2022-10-15 | Disposition: A | Discharge status: Home / Self Care | Payer: Federal, State, Local not specified - PPO | Source: Ambulatory Visit | Attending: Orthopedic Surgery | Admitting: Orthopedic Surgery

## 2022-10-15 ENCOUNTER — Encounter (HOSPITAL_COMMUNITY)
Admission: RE | Admit: 2022-10-15 | Discharge: 2022-10-15 | Disposition: A | Discharge status: Home / Self Care | Payer: Federal, State, Local not specified - PPO | Source: Ambulatory Visit | Attending: Orthopedic Surgery | Admitting: Orthopedic Surgery

## 2022-10-15 ENCOUNTER — Ambulatory Visit (INDEPENDENT_AMBULATORY_CARE_PROVIDER_SITE_OTHER): Payer: Federal, State, Local not specified - PPO | Admitting: Psychology

## 2022-10-15 VITALS — BP 141/96 | HR 57 | Temp 98.4°F | Resp 14 | Ht 68.0 in | Wt 178.0 lb

## 2022-10-15 DIAGNOSIS — F331 Major depressive disorder, recurrent, moderate: Secondary | ICD-10-CM | POA: Diagnosis not present

## 2022-10-15 DIAGNOSIS — Z01818 Encounter for other preprocedural examination: Secondary | ICD-10-CM | POA: Diagnosis present

## 2022-10-15 LAB — CBC
HCT: 43.2 % (ref 39.0–52.0)
Hemoglobin: 14.3 g/dL (ref 13.0–17.0)
MCH: 30 pg (ref 26.0–34.0)
MCHC: 33.1 g/dL (ref 30.0–36.0)
MCV: 90.6 fL (ref 80.0–100.0)
Platelets: 261 10*3/uL (ref 150–400)
RBC: 4.77 MIL/uL (ref 4.22–5.81)
RDW: 12.6 % (ref 11.5–15.5)
WBC: 7.8 10*3/uL (ref 4.0–10.5)
nRBC: 0 % (ref 0.0–0.2)

## 2022-10-15 LAB — SURGICAL PCR SCREEN
MRSA, PCR: NEGATIVE
Staphylococcus aureus: NEGATIVE
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
## 2022-10-16 ENCOUNTER — Ambulatory Visit: Payer: Federal, State, Local not specified - PPO | Admitting: Psychology

## 2022-10-24 ENCOUNTER — Other Ambulatory Visit: Payer: Self-pay | Admitting: Internal Medicine

## 2022-10-24 DIAGNOSIS — M1711 Unilateral primary osteoarthritis, right knee: Secondary | ICD-10-CM | POA: Insufficient documentation

## 2022-10-24 NOTE — H&P (Signed)
TOTAL KNEE ADMISSION H&P  Patient is being admitted for right total knee arthroplasty.  Subjective:  Chief Complaint:right knee pain.  HPI: Randall Torres, 72 y.o. male, has a history of pain and functional disability in the right knee due to arthritis and has failed non-surgical conservative treatments for greater than 12 weeks to includeNSAID's and/or analgesics, flexibility and strengthening excercises, weight reduction as appropriate, and activity modification.  Onset of symptoms was gradual, starting 2 years ago with gradually worsening course since that time. The patient noted no past surgery on the right knee(s).  Patient currently rates pain in the right knee(s) at 10 out of 10 with activity. Patient has night pain, worsening of pain with activity and weight bearing, pain that interferes with activities of daily living, pain with passive range of motion, crepitus, and joint swelling.  Patient has evidence of subchondral sclerosis, joint subluxation, and joint space narrowing by imaging studies.  There is no active infection.  Patient Active Problem List   Diagnosis Date Noted   Osteoarthritis of right knee 10/24/2022   Decreased hearing 05/25/2018   B12 deficiency 09/03/2017   Foraminal stenosis of lumbosacral region 05/16/2016   Neuropathy 05/03/2016   BPH (benign prostatic hyperplasia)    Visit for preventive health examination 12/31/2010   ALCOHOL ABUSE, IN REMISSION, HX OF 12/15/2009   Personal history of colonic adenoma 06/30/2008   HYPERLIPIDEMIA 09/29/2007   ERECTILE DYSFUNCTION 09/29/2007   ADD 09/29/2007   DEGENERATIVE JOINT DISEASE, LEFT KNEE 09/29/2007   Past Medical History:  Diagnosis Date   ADD (attention deficit disorder)    ADJUSTMENT DISORDER 09/29/2007   Qualifier: Diagnosis of  By: Regis Bill MD, Standley Brooking    BPH (benign prostatic hyperplasia)    has seen urologist   Hyperlipidemia    Inguinal hernia    bilateral   Personal history of colonic adenoma  06/30/2008   Personal history of COVID-04 October 2020   Rhinitis 12/31/2010    Past Surgical History:  Procedure Laterality Date   CYSTOSCOPY     INGUINAL HERNIA REPAIR Bilateral 10/18/2014   Procedure: LAPAROSCOPIC BILATERAL INGUINAL HERNIA REPAIR  ;  Surgeon: Fanny Skates, MD;  Location: [facility];  Service: General;  Laterality: Bilateral;   INGUINAL HERNIA REPAIR Right 07/13/2020   Procedure: RIGHT INGUINAL HERNIA REPAIR;  Surgeon: Alphonsa Overall, MD;  Location: [facility];  Service: General;  Laterality: Right;   INSERTION OF MESH Bilateral 10/18/2014   Procedure: INSERTION OF MESH;  Surgeon: Fanny Skates, MD;  Location: [facility];  Service: General;  Laterality: Bilateral;   INSERTION OF MESH Right 07/13/2020   Procedure: INSERTION OF MESH;  Surgeon: Alphonsa Overall, MD;  Location: [facility];  Service: General;  Laterality: Right;   KNEE ARTHROSCOPY     TOTAL KNEE ARTHROPLASTY  11/08   medial compartmental     No current facility-administered medications for this encounter.   Current Outpatient Medications  Medication Sig Dispense Refill Last Dose   MELATONIN PO Take 1 tablet by mouth at bedtime as needed (sleep).      simvastatin (ZOCOR) 40 MG tablet TAKE 1 TABLET(40 MG) BY MOUTH DAILY AT 6 PM (Patient taking differently: Take 40 mg by mouth daily.) 90 tablet 3    solifenacin (VESICARE) 10 MG tablet Take 10 mg by mouth daily.      tamsulosin (FLOMAX) 0.4 MG CAPS capsule TAKE 1 CAPSULE(0.4 MG) BY MOUTH DAILY 90 capsule 0    No Known Allergies  Social History   Tobacco Use   Smoking status: Never   Smokeless tobacco: Never  Substance Use Topics   Alcohol use: No    Alcohol/week: 0.0 standard drinks of alcohol    Comment: Hx of alcohol abuse - none since 2010    Family History  Problem Relation Age of Onset   Diabetes Other    Other Other        CVA age 58   Other Other        sleep problems   Myasthenia gravis Brother 72       deceased    Hyperlipidemia Other    Alcohol abuse Other        Uncle   Other Mother    Stroke Father    Diabetes Father    Throat cancer Sister    Breast cancer Sister    Healthy Son    Neuropathy Neg Hx      Review of Systems  Constitutional: Negative.   HENT:  Positive for hearing loss.   Eyes: Negative.   Respiratory: Negative.    Cardiovascular: Negative.   Gastrointestinal: Negative.   Endocrine: Negative.   Genitourinary:  Positive for frequency.       ED  Musculoskeletal:  Positive for arthralgias.  Allergic/Immunologic: Negative.   Neurological: Negative.   Psychiatric/Behavioral:  The patient is nervous/anxious.     Objective:  Physical Exam Constitutional:      Appearance: Normal appearance. He is normal weight.  HENT:     Head: Normocephalic and atraumatic.     Nose: Nose normal.     Mouth/Throat:     Pharynx: Oropharynx is clear.  Eyes:     Pupils: Pupils are equal, round, and reactive to light.  Cardiovascular:     Pulses: Normal pulses.  Pulmonary:     Effort: Pulmonary effort is normal.  Musculoskeletal:        General: Tenderness present.     Cervical back: Normal range of motion and neck supple.     Comments: the patient's left knee has good strength and has a range from 0-135.  No instability.  Patient's right knee has pain over the medial joint line.  Moderate crepitance with range of motion.  Mild fullness.  He has a range from near 0-125 on the right.  Calves are soft and nontender.  Skin:    General: Skin is warm and dry.  Neurological:     General: No focal deficit present.     Mental Status: He is alert and oriented to person, place, and time. Mental status is at baseline.  Psychiatric:        Mood and Affect: Mood normal.        Behavior: Behavior normal.        Thought Content: Thought content normal.        Judgment: Judgment normal.     Vital signs in last 24 hours:    Labs:   Estimated body mass index is 27.06 kg/m as calculated  from the following:   Height as of 10/15/22: 5' 8"$  (1.727 m).   Weight as of 10/15/22: 80.7 kg.   Imaging Review Plain radiographs demonstrate  end-stage bone-on-bone arthritis medial compartment right knee.  Slight lateral shift of the tibia beneath the femur.   Assessment/Plan:  End stage arthritis, right knee   The patient history, physical examination, clinical judgment of the provider and imaging studies are consistent with end stage degenerative joint disease of the right knee(s)  and total knee arthroplasty is deemed medically necessary. The treatment options including medical management, injection therapy arthroscopy and arthroplasty were discussed at length. The risks and benefits of total knee arthroplasty were presented and reviewed. The risks due to aseptic loosening, infection, stiffness, patella tracking problems, thromboembolic complications and other imponderables were discussed. The patient acknowledged the explanation, agreed to proceed with the plan and consent was signed. Patient is being admitted for inpatient treatment for surgery, pain control, PT, OT, prophylactic antibiotics, VTE prophylaxis, progressive ambulation and ADL's and discharge planning. The patient is planning to be discharged home with home health services     Patient's anticipated LOS is less than 2 midnights, meeting these requirements: - Younger than 17 - Lives within 1 hour of care - Has a competent adult at home to recover with post-op recover - NO history of  - Chronic pain requiring opiods  - Diabetes  - Coronary Artery Disease  - Heart failure  - Heart attack  - Stroke  - DVT/VTE  - Cardiac arrhythmia  - Respiratory Failure/COPD  - Renal failure  - Anemia  - Advanced Liver disease

## 2022-10-27 MED ORDER — TRANEXAMIC ACID 1000 MG/10ML IV SOLN
2000.0000 mg | INTRAVENOUS | Status: DC
  Filled 2022-10-27: qty 20

## 2022-10-27 NOTE — Anesthesia Preprocedure Evaluation (Signed)
Anesthesia Evaluation  Patient identified by MRN, date of birth, ID band Patient awake    Reviewed: Allergy & Precautions, NPO status , Patient's Chart, lab work & pertinent test results  History of Anesthesia Complications Negative for: history of anesthetic complications  Airway Mallampati: II  TM Distance: >3 FB Neck ROM: Full    Dental  (+) Dental Advisory Given, Teeth Intact   Pulmonary neg pulmonary ROS   Pulmonary exam normal        Cardiovascular negative cardio ROS  Rhythm:Regular Rate:Bradycardia     Neuro/Psych  Neuromuscular disease  negative psych ROS   GI/Hepatic negative GI ROS, Neg liver ROS,,,  Endo/Other  negative endocrine ROS    Renal/GU negative Renal ROS     Musculoskeletal  (+) Arthritis ,    Abdominal   Peds  (+) ATTENTION DEFICIT DISORDER WITHOUT HYPERACTIVITY Hematology negative hematology ROS (+)   Anesthesia Other Findings   Reproductive/Obstetrics                             Anesthesia Physical Anesthesia Plan  ASA: 2  Anesthesia Plan: Spinal   Post-op Pain Management: Regional block* and Tylenol PO (pre-op)*   Induction:   PONV Risk Score and Plan: 1 and Treatment may vary due to age or medical condition and Propofol infusion  Airway Management Planned: Natural Airway and Simple Face Mask  Additional Equipment: None  Intra-op Plan:   Post-operative Plan:   Informed Consent: I have reviewed the patients History and Physical, chart, labs and discussed the procedure including the risks, benefits and alternatives for the proposed anesthesia with the patient or authorized representative who has indicated his/her understanding and acceptance.       Plan Discussed with: CRNA and Anesthesiologist  Anesthesia Plan Comments: (Labs reviewed, platelets acceptable. Discussed risks and benefits of spinal, including spinal/epidural hematoma, infection,  failed block, and PDPH. Patient expressed understanding and wished to proceed. )        Anesthesia Quick Evaluation

## 2022-10-28 ENCOUNTER — Encounter (HOSPITAL_COMMUNITY)
Admission: RE | Disposition: A | Discharge status: Home / Self Care | Payer: Self-pay | Source: Ambulatory Visit | Attending: Orthopedic Surgery

## 2022-10-28 ENCOUNTER — Ambulatory Visit (HOSPITAL_COMMUNITY): Payer: Federal, State, Local not specified - PPO | Admitting: Anesthesiology

## 2022-10-28 ENCOUNTER — Other Ambulatory Visit: Payer: Self-pay

## 2022-10-28 ENCOUNTER — Encounter (HOSPITAL_COMMUNITY): Payer: Self-pay | Admitting: Orthopedic Surgery

## 2022-10-28 ENCOUNTER — Ambulatory Visit (HOSPITAL_COMMUNITY)
Admission: RE | Admit: 2022-10-28 | Discharge: 2022-10-28 | Disposition: A | Discharge status: Home / Self Care | Payer: Federal, State, Local not specified - PPO | Source: Ambulatory Visit | Attending: Orthopedic Surgery | Admitting: Orthopedic Surgery

## 2022-10-28 DIAGNOSIS — G629 Polyneuropathy, unspecified: Secondary | ICD-10-CM | POA: Insufficient documentation

## 2022-10-28 DIAGNOSIS — M1711 Unilateral primary osteoarthritis, right knee: Secondary | ICD-10-CM | POA: Diagnosis present

## 2022-10-28 DIAGNOSIS — M25761 Osteophyte, right knee: Secondary | ICD-10-CM | POA: Insufficient documentation

## 2022-10-28 HISTORY — PX: TOTAL KNEE ARTHROPLASTY: SHX125

## 2022-10-28 SURGERY — ARTHROPLASTY, KNEE, TOTAL
Anesthesia: Spinal | Site: Knee | Laterality: Right

## 2022-10-28 MED ORDER — DEXAMETHASONE SODIUM PHOSPHATE 10 MG/ML IJ SOLN
INTRAMUSCULAR | Status: AC
  Filled 2022-10-28: qty 1

## 2022-10-28 MED ORDER — 0.9 % SODIUM CHLORIDE (POUR BTL) OPTIME
TOPICAL | Status: DC | PRN
  Administered 2022-10-28: 1000 mL

## 2022-10-28 MED ORDER — ONDANSETRON HCL 4 MG/2ML IJ SOLN
INTRAMUSCULAR | Status: DC | PRN
  Administered 2022-10-28: 4 mg via INTRAVENOUS

## 2022-10-28 MED ORDER — LACTATED RINGERS IV SOLN: INTRAVENOUS | Status: DC

## 2022-10-28 MED ORDER — ONDANSETRON HCL 4 MG/2ML IJ SOLN: 4.0000 mg | Freq: Once | INTRAMUSCULAR | Status: DC | PRN

## 2022-10-28 MED ORDER — LIDOCAINE HCL (PF) 2 % IJ SOLN
INTRAMUSCULAR | Status: AC
  Filled 2022-10-28: qty 5

## 2022-10-28 MED ORDER — ACETAMINOPHEN 500 MG PO TABS
1000.0000 mg | ORAL_TABLET | Freq: Once | ORAL | Status: AC
  Administered 2022-10-28: 1000 mg via ORAL
  Filled 2022-10-28: qty 2

## 2022-10-28 MED ORDER — ORAL CARE MOUTH RINSE: 15.0000 mL | Freq: Once | OROMUCOSAL | Status: AC

## 2022-10-28 MED ORDER — FENTANYL CITRATE (PF) 100 MCG/2ML IJ SOLN
INTRAMUSCULAR | Status: AC
  Filled 2022-10-28: qty 2

## 2022-10-28 MED ORDER — ROPIVACAINE HCL 7.5 MG/ML IJ SOLN
INTRAMUSCULAR | Status: DC | PRN
  Administered 2022-10-28: 20 mL via PERINEURAL

## 2022-10-28 MED ORDER — EPHEDRINE SULFATE-NACL 50-0.9 MG/10ML-% IV SOSY
PREFILLED_SYRINGE | INTRAVENOUS | Status: DC | PRN
  Administered 2022-10-28 (×2): 5 mg via INTRAVENOUS

## 2022-10-28 MED ORDER — BUPIVACAINE LIPOSOME 1.3 % IJ SUSP
INTRAMUSCULAR | Status: AC
  Filled 2022-10-28: qty 20

## 2022-10-28 MED ORDER — EPHEDRINE 5 MG/ML INJ
INTRAVENOUS | Status: AC
  Filled 2022-10-28: qty 5

## 2022-10-28 MED ORDER — PROPOFOL 10 MG/ML IV BOLUS
INTRAVENOUS | Status: AC
  Filled 2022-10-28: qty 20

## 2022-10-28 MED ORDER — PROPOFOL 10 MG/ML IV BOLUS
INTRAVENOUS | Status: DC | PRN
  Administered 2022-10-28 (×4): 20 mg via INTRAVENOUS

## 2022-10-28 MED ORDER — TIZANIDINE HCL 2 MG PO CAPS: 4.0000 mg | ORAL_CAPSULE | Freq: Three times a day (TID) | ORAL | 0 refills | Status: DC | PRN

## 2022-10-28 MED ORDER — MIDAZOLAM HCL 5 MG/5ML IJ SOLN
INTRAMUSCULAR | Status: DC | PRN
  Administered 2022-10-28 (×2): 1 mg via INTRAVENOUS

## 2022-10-28 MED ORDER — TRANEXAMIC ACID-NACL 1000-0.7 MG/100ML-% IV SOLN: 1000.0000 mg | Freq: Once | INTRAVENOUS | Status: DC

## 2022-10-28 MED ORDER — ONDANSETRON HCL 4 MG/2ML IJ SOLN
INTRAMUSCULAR | Status: AC
  Filled 2022-10-28: qty 2

## 2022-10-28 MED ORDER — CHLORHEXIDINE GLUCONATE 0.12 % MT SOLN
15.0000 mL | Freq: Once | OROMUCOSAL | Status: AC
  Administered 2022-10-28: 15 mL via OROMUCOSAL

## 2022-10-28 MED ORDER — BUPIVACAINE IN DEXTROSE 0.75-8.25 % IT SOLN
INTRATHECAL | Status: DC | PRN
  Administered 2022-10-28: 1.6 mL via INTRATHECAL

## 2022-10-28 MED ORDER — TRANEXAMIC ACID-NACL 1000-0.7 MG/100ML-% IV SOLN
1000.0000 mg | INTRAVENOUS | Status: AC
  Administered 2022-10-28: 1000 mg via INTRAVENOUS
  Filled 2022-10-28: qty 100

## 2022-10-28 MED ORDER — FENTANYL CITRATE PF 50 MCG/ML IJ SOSY: 25.0000 ug | PREFILLED_SYRINGE | INTRAMUSCULAR | Status: DC | PRN

## 2022-10-28 MED ORDER — FENTANYL CITRATE (PF) 100 MCG/2ML IJ SOLN
INTRAMUSCULAR | Status: DC | PRN
  Administered 2022-10-28 (×2): 50 ug via INTRAVENOUS

## 2022-10-28 MED ORDER — BUPIVACAINE HCL 0.25 % IJ SOLN
INTRAMUSCULAR | Status: AC
  Filled 2022-10-28: qty 1

## 2022-10-28 MED ORDER — PROPOFOL 500 MG/50ML IV EMUL
INTRAVENOUS | Status: DC | PRN
  Administered 2022-10-28: 75 ug/kg/min via INTRAVENOUS

## 2022-10-28 MED ORDER — POVIDONE-IODINE 10 % EX SWAB: 2.0000 | Freq: Once | CUTANEOUS | Status: DC

## 2022-10-28 MED ORDER — PHENYLEPHRINE HCL-NACL 20-0.9 MG/250ML-% IV SOLN
INTRAVENOUS | Status: DC | PRN
  Administered 2022-10-28: 30 ug/min via INTRAVENOUS

## 2022-10-28 MED ORDER — PROPOFOL 1000 MG/100ML IV EMUL
INTRAVENOUS | Status: AC
  Filled 2022-10-28: qty 100

## 2022-10-28 MED ORDER — SODIUM CHLORIDE (PF) 0.9 % IJ SOLN
INTRAMUSCULAR | Status: DC | PRN
  Administered 2022-10-28: 120 mL

## 2022-10-28 MED ORDER — SODIUM CHLORIDE (PF) 0.9 % IJ SOLN
INTRAMUSCULAR | Status: AC
  Filled 2022-10-28: qty 50

## 2022-10-28 MED ORDER — LACTATED RINGERS IV BOLUS: 250.0000 mL | Freq: Once | INTRAVENOUS | Status: DC

## 2022-10-28 MED ORDER — LACTATED RINGERS IV BOLUS
500.0000 mL | Freq: Once | INTRAVENOUS | Status: AC
  Administered 2022-10-28: 500 mL via INTRAVENOUS

## 2022-10-28 MED ORDER — LACTATED RINGERS IV BOLUS
250.0000 mL | Freq: Once | INTRAVENOUS | Status: AC
  Administered 2022-10-28: 250 mL via INTRAVENOUS

## 2022-10-28 MED ORDER — DEXAMETHASONE SODIUM PHOSPHATE 10 MG/ML IJ SOLN
INTRAMUSCULAR | Status: DC | PRN
  Administered 2022-10-28: 10 mg via INTRAVENOUS

## 2022-10-28 MED ORDER — CEFAZOLIN SODIUM-DEXTROSE 2-4 GM/100ML-% IV SOLN
2.0000 g | INTRAVENOUS | Status: AC
  Administered 2022-10-28: 2 g via INTRAVENOUS
  Filled 2022-10-28: qty 100

## 2022-10-28 MED ORDER — WATER FOR IRRIGATION, STERILE IR SOLN
Status: DC | PRN
  Administered 2022-10-28: 2000 mL

## 2022-10-28 MED ORDER — OXYCODONE HCL 5 MG PO TABS: 5.0000 mg | ORAL_TABLET | Freq: Once | ORAL | Status: DC | PRN

## 2022-10-28 MED ORDER — OXYCODONE HCL 5 MG PO TABS: 5.0000 mg | ORAL_TABLET | ORAL | 0 refills | Status: AC | PRN

## 2022-10-28 MED ORDER — OXYCODONE HCL 5 MG/5ML PO SOLN: 5.0000 mg | Freq: Once | ORAL | Status: DC | PRN

## 2022-10-28 MED ORDER — ASPIRIN 81 MG PO TBEC: 81.0000 mg | DELAYED_RELEASE_TABLET | Freq: Every day | ORAL | 2 refills | Status: DC

## 2022-10-28 MED ORDER — BUPIVACAINE LIPOSOME 1.3 % IJ SUSP: 20.0000 mL | Freq: Once | INTRAMUSCULAR | Status: DC

## 2022-10-28 MED ORDER — SODIUM CHLORIDE (PF) 0.9 % IJ SOLN
INTRAMUSCULAR | Status: AC
  Filled 2022-10-28: qty 20

## 2022-10-28 MED ORDER — MIDAZOLAM HCL 2 MG/2ML IJ SOLN
INTRAMUSCULAR | Status: AC
  Filled 2022-10-28: qty 2

## 2022-10-28 SURGICAL SUPPLY — 51 items
ATTUNE MED DOME PAT 41 KNEE (Knees) IMPLANT
ATTUNE PS FEM RT SZ 8 CEM KNEE (Femur) IMPLANT
ATTUNE PSRP INSR SZ8 5 KNEE (Insert) IMPLANT
BAG COUNTER SPONGE SURGICOUNT (BAG) IMPLANT
BAG DECANTER FOR FLEXI CONT (MISCELLANEOUS) ×1 IMPLANT
BAG SPEC THK2 15X12 ZIP CLS (MISCELLANEOUS) ×1
BAG SPNG CNTER NS LX DISP (BAG)
BAG ZIPLOCK 12X15 (MISCELLANEOUS) ×1 IMPLANT
BASE TIBIAL ROT PLAT SZ 7 KNEE (Knees) IMPLANT
BLADE SAG 18X100X1.27 (BLADE) ×1 IMPLANT
BLADE SAW SGTL 11.0X1.19X90.0M (BLADE) ×1 IMPLANT
BLADE SURG SZ10 CARB STEEL (BLADE) ×2 IMPLANT
BNDG CMPR MED 10X6 ELC LF (GAUZE/BANDAGES/DRESSINGS) ×1
BNDG ELASTIC 6X10 VLCR STRL LF (GAUZE/BANDAGES/DRESSINGS) ×1 IMPLANT
BOWL SMART MIX CTS (DISPOSABLE) ×1 IMPLANT
BSPLAT TIB 7 CMNT ROT PLAT STR (Knees) ×1 IMPLANT
CEMENT HV SMART SET (Cement) ×2 IMPLANT
COVER SURGICAL LIGHT HANDLE (MISCELLANEOUS) ×1 IMPLANT
DRAPE INCISE IOBAN 66X45 STRL (DRAPES) IMPLANT
DRAPE U-SHAPE 47X51 STRL (DRAPES) ×1 IMPLANT
DRSG AQUACEL AG ADV 3.5X10 (GAUZE/BANDAGES/DRESSINGS) ×1 IMPLANT
DURAPREP 26ML APPLICATOR (WOUND CARE) ×1 IMPLANT
ELECT REM PT RETURN 15FT ADLT (MISCELLANEOUS) ×1 IMPLANT
GLOVE BIO SURGEON STRL SZ7.5 (GLOVE) ×1 IMPLANT
GLOVE BIO SURGEON STRL SZ8.5 (GLOVE) ×1 IMPLANT
GLOVE BIOGEL PI IND STRL 8 (GLOVE) ×1 IMPLANT
GLOVE BIOGEL PI IND STRL 9 (GLOVE) ×1 IMPLANT
GOWN STRL REUS W/ TWL XL LVL3 (GOWN DISPOSABLE) ×2 IMPLANT
GOWN STRL REUS W/TWL XL LVL3 (GOWN DISPOSABLE) ×2
HANDPIECE INTERPULSE COAX TIP (DISPOSABLE) ×1
HOOD PEEL AWAY T7 (MISCELLANEOUS) ×3 IMPLANT
KIT TURNOVER KIT A (KITS) IMPLANT
NDL HYPO 21X1.5 SAFETY (NEEDLE) ×2 IMPLANT
NEEDLE HYPO 21X1.5 SAFETY (NEEDLE) ×2 IMPLANT
NS IRRIG 1000ML POUR BTL (IV SOLUTION) ×1 IMPLANT
PACK TOTAL KNEE CUSTOM (KITS) ×1 IMPLANT
PIN STEINMAN FIXATION KNEE (PIN) IMPLANT
PROTECTOR NERVE ULNAR (MISCELLANEOUS) ×1 IMPLANT
SET HNDPC FAN SPRY TIP SCT (DISPOSABLE) ×1 IMPLANT
SPIKE FLUID TRANSFER (MISCELLANEOUS) ×3 IMPLANT
SUT VIC AB 1 CTX 36 (SUTURE) ×1
SUT VIC AB 1 CTX36XBRD ANBCTR (SUTURE) ×1 IMPLANT
SUT VIC AB 3-0 CT1 27 (SUTURE) ×3
SUT VIC AB 3-0 CT1 TAPERPNT 27 (SUTURE) ×3 IMPLANT
SYR CONTROL 10ML LL (SYRINGE) ×2 IMPLANT
TIBIAL BASE ROT PLAT SZ 7 KNEE (Knees) ×1 IMPLANT
TRAY CATH INTERMITTENT SS 16FR (CATHETERS) IMPLANT
TRAY FOLEY MTR SLVR 16FR STAT (SET/KITS/TRAYS/PACK) ×1 IMPLANT
TUBE SUCTION HIGH CAP CLEAR NV (SUCTIONS) ×1 IMPLANT
WATER STERILE IRR 1000ML POUR (IV SOLUTION) ×2 IMPLANT
WRAP KNEE MAXI GEL POST OP (GAUZE/BANDAGES/DRESSINGS) ×1 IMPLANT

## 2022-10-28 NOTE — Progress Notes (Signed)
Orthopedic Tech Progress Note Patient Details:  Randall Torres 05-Jan-1951 [id number]  Ortho Devices Type of Ortho Device: Bone foam zero knee Ortho Device/Splint Interventions: Ordered   Post Interventions Instructions Provided: Care of device Bone foam dropped off at bedside with PACU RN. Randall Torres 10/28/2022, 9:20 AM

## 2022-10-28 NOTE — Anesthesia Postprocedure Evaluation (Signed)
Anesthesia Post Note  Patient: Randall Torres  Procedure(s) Performed: RIGHT TOTAL KNEE ARTHROPLASTY (Right: Knee)     Patient location during evaluation: PACU Anesthesia Type: Spinal Level of consciousness: awake and alert Pain management: pain level controlled Vital Signs Assessment: post-procedure vital signs reviewed and stable Respiratory status: spontaneous breathing and respiratory function stable Cardiovascular status: blood pressure returned to baseline and stable Postop Assessment: spinal receding and no apparent nausea or vomiting Anesthetic complications: no   No notable events documented.  Last Vitals:  Vitals:   10/28/22 1040 10/28/22 1100  BP: (!) 158/92 (!) 138/95  Pulse: (!) 52 71  Resp: 16 16  Temp: 36.7 C   SpO2: 99% 96%    Last Pain:  Vitals:   10/28/22 1100  TempSrc:   PainSc: 0-No pain                 Audry Pili

## 2022-10-28 NOTE — Anesthesia Procedure Notes (Signed)
Date/Time: 10/28/2022 7:16 AM  Performed by: Sharlette Dense, CRNAOxygen Delivery Method: Simple face mask

## 2022-10-28 NOTE — Anesthesia Procedure Notes (Signed)
Anesthesia Regional Block: Adductor canal block   Pre-Anesthetic Checklist: , timeout performed,  Correct Patient, Correct Site, Correct Laterality,  Correct Procedure, Correct Position, site marked,  Risks and benefits discussed,  Surgical consent,  Pre-op evaluation,  At surgeon's request and post-op pain management  Laterality: Right  Prep: chloraprep       Needles:  Injection technique: Single-shot  Needle Type: Echogenic Needle     Needle Length: 10cm  Needle Gauge: 21     Additional Needles:   Narrative:  Start time: 10/28/2022 6:54 AM End time: 10/28/2022 6:57 AM Injection made incrementally with aspirations every 5 mL.  Performed by: Personally  Anesthesiologist: Audry Pili, MD  Additional Notes: No pain on injection. No increased resistance to injection. Injection made in 5cc increments. Good needle visualization. Patient tolerated the procedure well.

## 2022-10-28 NOTE — Transfer of Care (Signed)
Immediate Anesthesia Transfer of Care Note  Patient: Randall Torres  Procedure(s) Performed: RIGHT TOTAL KNEE ARTHROPLASTY (Right: Knee)  Patient Location: PACU  Anesthesia Type:Spinal  Level of Consciousness: awake and alert   Airway & Oxygen Therapy: Patient Spontanous Breathing and Patient connected to face mask oxygen  Post-op Assessment: Report given to RN and Post -op Vital signs reviewed and stable  Post vital signs: Reviewed and stable  Last Vitals:  Vitals Value Taken Time  BP 121/77 10/28/22 0915  Temp    Pulse 69 10/28/22 0916  Resp 19 10/28/22 0916  SpO2 100 % 10/28/22 0916  Vitals shown include unvalidated device data.  Last Pain:  Vitals:   10/28/22 0622  TempSrc: Oral  PainSc:       Patients Stated Pain Goal: 2 ([id number])  Complications: No notable events documented.

## 2022-10-28 NOTE — Discharge Instructions (Signed)

## 2022-10-28 NOTE — Anesthesia Procedure Notes (Signed)
Spinal  Patient location during procedure: OR Start time: 10/28/2022 7:18 AM End time: 10/28/2022 7:21 AM Reason for block: surgical anesthesia Staffing Performed: anesthesiologist  Anesthesiologist: Audry Pili, MD Performed by: Audry Pili, MD Authorized by: Audry Pili, MD   Preanesthetic Checklist Completed: patient identified, IV checked, risks and benefits discussed, surgical consent, monitors and equipment checked, pre-op evaluation and timeout performed Spinal Block Patient position: sitting Prep: DuraPrep Patient monitoring: heart rate, cardiac monitor, continuous pulse ox and blood pressure Approach: midline Location: L3-4 Injection technique: single-shot Needle Needle type: Pencan  Needle gauge: 24 G Additional Notes Consent was obtained prior to the procedure with all questions answered and concerns addressed. Risks including, but not limited to, bleeding, infection, nerve damage, paralysis, failed block, inadequate analgesia, allergic reaction, high spinal, itching, and headache were discussed and the patient wished to proceed. Functioning IV was confirmed and monitors were applied. Sterile prep and drape, including hand hygiene, mask, and sterile gloves were used. The patient was positioned and the spine was prepped. The skin was anesthetized with lidocaine. Free flow of clear CSF was obtained prior to injecting local anesthetic into the CSF. The spinal needle aspirated freely following injection. The needle was carefully withdrawn. The patient tolerated the procedure well.   Renold Don, MD

## 2022-10-28 NOTE — Interval H&P Note (Signed)
History and Physical Interval Note:  10/28/2022 6:57 AM  Randall Torres  has presented today for surgery, with the diagnosis of [place].  The various methods of treatment have been discussed with the patient and family. After consideration of risks, benefits and other options for treatment, the patient has consented to  Procedure(s): RIGHT TOTAL KNEE ARTHROPLASTY (Right) as a surgical intervention.  The patient's history has been reviewed, patient examined, no change in status, stable for surgery.  I have reviewed the patient's chart and labs.  Questions were answered to the patient's satisfaction.     Kerin Salen

## 2022-10-28 NOTE — Op Note (Signed)
PATIENT ID:      Randall Torres  MRN:     [id number] DOB/AGE:    04/12/1951 / 72 y.o.       OPERATIVE REPORT   DATE OF PROCEDURE:  10/28/2022      PREOPERATIVE DIAGNOSIS:   RIGHT KNEE OSETOARTHRITIS      Estimated body mass index is 27.06 kg/m as calculated from the following:   Height as of this encounter: 5' 8"$  (1.727 m).   Weight as of this encounter: 80.7 kg.                                                       POSTOPERATIVE DIAGNOSIS:   Same                                                                  PROCEDURE:  Procedure(s): RIGHT TOTAL KNEE ARTHROPLASTY Using DepuyAttune RP implants #8R Femur, #7Tibia, 5 mm Attune RP bearing, 41 Patella    SURGEON: Kerin Salen  ASSISTANT:   Kerry Hough. [employer]   (Present and scrubbed throughout the case, critical for assistance with exposure, retraction, instrumentation, and closure.)        ANESTHESIA: Spinal, 20cc Exparel, 50cc 0.25% Marcaine EBL: 300 cc FLUID REPLACEMENT: 1500 cc crystaloid TOURNIQUET: DRAINS: None TRANEXAMIC ACID: 1gm IV, 2gm topical COMPLICATIONS:  None         INDICATIONS FOR PROCEDURE: The patient has  RIGHT KNEE OSETOARTHRITIS, Var deformities, XR shows bone on bone arthritis, lateral subluxation of tibia. Patient has failed all conservative measures including anti-inflammatory medicines, narcotics, attempts at exercise and weight loss, cortisone injections and viscosupplementation.  Risks and benefits of surgery have been discussed, questions answered.   DESCRIPTION OF PROCEDURE: The patient identified by armband, received  IV antibiotics, in the holding area at [facility]. Patient taken to the operating room, appropriate anesthetic monitors were attached, and Spinal anesthesia was  induced. IV Tranexamic acid was given. Lateral post and 2 surefoot positioners applied to the table, the lower extremity was then prepped and draped in usual sterile fashion from the toes to the high thigh. Time-out  procedure was performed. Kerry Hough. [facility] PAC, was present and scrubbed throughout the case, critical for assistance with, positioning, exposure, retraction, instrumentation, and closure.The skin and subcutaneous tissue along the incision was injected with 20 cc of a mixture of 20cc Exparel and 30cc Marcaine 50cc saline solution, using a 21-gauge by 1-1/2 inch needle. We began the operation, with the knee flexed 130 degrees, by making the anterior midline incision starting at handbreadth above the patella going over the patella 1 cm medial to and 4 cm distal to the tibial tubercle. Small bleeders in the skin and the subcutaneous tissue identified and cauterized. Transverse retinaculum was incised and reflected medially and a medial parapatellar arthrotomy was accomplished. the patella was everted and theprepatellar fat pad resected. The superficial medial collateral ligament was then elevated from anterior to posterior along the proximal flare of the tibia and anterior half of the menisci resected. The knee was hyperflexed exposing bone on bone arthritis. Peripheral  and notch osteophytes as well as the cruciate ligaments were then resected. We continued to work our way around posteriorly along the proximal tibia, and externally rotated the tibia subluxing it out from underneath the femur. A McHale PCL retractor was placed through the notch, a lateral Hohmann retractor, and anterolateral small homan retractor placed. We then entered the proximal tibia with the Depuy starter drill in line with the axis of the tibia followed by an intramedullary guide rod and 3-degree posterior slope cutting guide. The tibial cutting guide, was pinned into place allowing resection of 3 mm of bone medially and 12 mm of bone laterally. Satisfied with the tibial resection, we then entered the distal femur 2 mm anterior to the PCL origin with the starter drill, followed by the intramedullary guide rod and applied the distal femoral cutting  guide set at 9 mm, with 5 degrees of valgus. This was pinned along the epicondylar axis. At this point, the distal femoral cut was accomplished without difficulty. We then sized for a #8R femoral component and pinned the chamfer guide in 3 degrees of external rotation. The anterior, posterior, and chamfer cuts were accomplished without difficulty followed by the Attune RP box cutting guide and the box cut. We also removed posterior osteophytes from the posterior femoral condyles. The posterior capsule was injected with Exparel solution. The knee was brought into full extension. We checked our extension gap and fit a 5 mm trial lollipop. Distracting in extension with a lamina spreader,  bleeders in the posterior capsule, Posterior medial and posterior lateral gutter were cauterized.  The transexamic acid-soaked sponge was then placed in the gap of the knee in extension. The knee was flexed 30. The posterior patella cut was accomplished with the 9.5 mm Attune cutting guide, sized for a 41 mm dome, and the fixation pegs drilled.The knee was then once again hyperflexed exposing the proximal tibia. We sized for a # 7 tibial base plate, applied the smokestack and the conical reamer followed by the the Delta fin keel punch. We then hammered into place the Attune RP trial femoral component, drilled the lugs, inserted a  5 mm trial bearing, trial patellar button, and took the knee through range of motion from 0-130 degrees. Medial and lateral ligamentous stability was checked. No thumb pressure was required for patellar Tracking.  All trial components were removed, mating surfaces irrigated with pulse lavage, and dried with suction and sponges. 10 cc of the Exparel solution was applied to the cancellus bone of the patella distal femur and proximal tibia.  After waiting 30 seconds, the bony surfaces were again, dried with sponges. A double batch of DePuy HV cement was mixed and applied to all bony metallic mating surfaces  except for the posterior condyles of the femur itself. In order, we hammered into place the tibial tray and removed excess cement, the femoral component and removed excess cement. The final Attune RP bearing was inserted, and the knee brought to full extension with compression. The patellar button was clamped into place, and excess cement removed. The knee was held at 30 flexion with compression using the second surefoot, while the cement cured. The wound was irrigated out with normal saline solution pulse lavage. The rest of the Exparel was injected into the parapatellar arthrotomy, subcutaneous tissues, and periosteal tissues. The parapatellar arthrotomy was closed with running #1 Vicryl suture. The subcutaneous tissue with 3-0 undyed Vicryl suture, and the skin with running 3-0 SQ vicryl. An Aquacil dressing and Ace wrap  were applied. The patient was taken to recovery room without difficulty.   Kerin Salen 10/28/2022, 6:59 AM

## 2022-10-28 NOTE — Evaluation (Signed)
Physical Therapy Evaluation Patient Details Name: Randall Torres MRN: [id number] DOB: Jan 09, 1951 Today's Date: 10/28/2022  History of Present Illness  Pt is 72 yo male presents to therapy s/p R TKA on 10/28/2022 due to failure of conservative measures. Pt has pmh including but not limited to [facility], vit B deficiency, lumbosacral stenosis, neuropathy, alcohol abuse, colonic adenoma, HDL, BPH L TKA (2008)  Clinical Impression  Pt is s/p TKA resulting in the deficits listed below (see PT Problem List). At baseline, pt active and independent.  Pt seen in PACU for possible same day d/c.  He demonstrated excellent quad activation, ROM, and pain control.  Pt was able to ambulate 120' and demonstrated stairs similar to home set up .   Pt demonstrates safe gait & transfers in order to return home from PT perspective once discharged by MD.  While in hospital, will continue to benefit from PT for skilled therapy to advance mobility and exercises.            Recommendations for follow up therapy are one component of a multi-disciplinary discharge planning process, led by the attending physician.  Recommendations may be updated based on patient status, additional functional criteria and insurance authorization.  Follow Up Recommendations Follow physician's recommendations for discharge plan and follow up therapies      Assistance Recommended at Discharge Intermittent Supervision/Assistance  Patient can return home with the following  A little help with walking and/or transfers;A little help with bathing/dressing/bathroom;Assistance with cooking/housework;Help with stairs or ramp for entrance    Equipment Recommendations None recommended by PT  Recommendations for Other Services       Functional Status Assessment Patient has had a recent decline in their functional status and demonstrates the ability to make significant improvements in function in a reasonable and predictable amount of time.      Precautions / Restrictions Precautions Precautions: Fall Restrictions Weight Bearing Restrictions: Yes RLE Weight Bearing: Weight bearing as tolerated      Mobility  Bed Mobility   Bed Mobility: Supine to Sit, Sit to Supine     Supine to sit: Supervision Sit to supine: Supervision        Transfers Overall transfer level: Needs assistance Equipment used: Rolling walker (2 wheels) Transfers: Sit to/from Stand Sit to Stand: Supervision, Min guard           General transfer comment: min guard progressing to supervision; cues for hand placement    Ambulation/Gait Ambulation/Gait assistance: Min guard, Supervision Gait Distance (Feet): 120 Feet Assistive device: Rolling walker (2 wheels) Gait Pattern/deviations: Step-through pattern       General Gait Details: Min guard progressing to supervision; initial cues forRW proximity; only slight decrease in weight shift to R  Stairs Stairs: Yes Stairs assistance: Min guard Stair Management: Two rails, Step to pattern, Forwards Number of Stairs: 5 General stair comments: Provided cues and pt performed without difficulty  Wheelchair Mobility    Modified Rankin (Stroke Patients Only)       Balance Overall balance assessment: Needs assistance Sitting-balance support: No upper extremity supported Sitting balance-Leahy Scale: Good     Standing balance support: No upper extremity supported, Bilateral upper extremity supported Standing balance-Leahy Scale: Fair Standing balance comment: Could stand for ADLs without support; RW to ambulate                             Pertinent Vitals/Pain Pain Assessment Pain Assessment: No/denies pain  Home Living Family/patient expects to be discharged to:: Private residence Living Arrangements: Spouse/significant other Available Help at Discharge: Family;Available 24 hours/day Type of Home: House Home Access: Stairs to enter Entrance Stairs-Rails:  Psychiatric nurse of Steps: 3   Home Layout: One level Home Equipment: Grab bars - tub/shower;Rolling Walker (2 wheels)      Prior Function Prior Level of Function : Independent/Modified Independent             Mobility Comments: ambulates in community without AD; walked a mile this morning ADLs Comments: independent with adls and iadls     Hand Dominance        Extremity/Trunk Assessment   Upper Extremity Assessment Upper Extremity Assessment: Overall WFL for tasks assessed    Lower Extremity Assessment Lower Extremity Assessment: LLE deficits/detail;RLE deficits/detail RLE Deficits / Details: ROM: 0 to 90 degrees at knee without difficulty; MMT: 3/5 throughout not further tested; SLR without lag LLE Deficits / Details: ROM WFL; MMT 5/5    Cervical / Trunk Assessment Cervical / Trunk Assessment: Normal  Communication   Communication: HOH  Cognition Arousal/Alertness: Awake/alert Behavior During Therapy: Impulsive Overall Cognitive Status: Within Functional Limits for tasks assessed                                 General Comments: Pt somewhat impulsive (reports this is baseline); Very pleasant and motivated        General Comments      Exercises Total Joint Exercises Ankle Circles/Pumps: AROM, Both, 5 reps, Seated Quad Sets: AROM, Right, 5 reps, Supine Heel Slides: AROM, 5 reps, Supine, Right Hip ABduction/ADduction: AROM, Right, 5 reps, Supine Long Arc Quad: AROM, Right, 5 reps, Seated Knee Flexion: AROM, Right, 5 reps, Seated Goniometric ROM: R knee 0 to 90 degrees   Assessment/Plan    PT Assessment Patient needs continued PT services  PT Problem List Decreased strength;Decreased range of motion;Decreased balance;Decreased mobility;Decreased knowledge of precautions;Decreased safety awareness;Decreased knowledge of use of DME;Pain       PT Treatment Interventions DME instruction;Therapeutic exercise;Gait  training;Balance training;Stair training;Functional mobility training;Therapeutic activities;Patient/family education;Modalities    PT Goals (Current goals can be found in the Care Plan section)  Acute Rehab PT Goals Patient Stated Goal: return home PT Goal Formulation: With patient/family Time For Goal Achievement: 11/11/22 Potential to Achieve Goals: Good    Frequency 7X/week     Co-evaluation               AM-PAC PT "6 Clicks" Mobility  Outcome Measure Help needed turning from your back to your side while in a flat bed without using bedrails?: None Help needed moving from lying on your back to sitting on the side of a flat bed without using bedrails?: A Little Help needed moving to and from a bed to a chair (including a wheelchair)?: A Little Help needed standing up from a chair using your arms (e.g., wheelchair or bedside chair)?: A Little Help needed to walk in hospital room?: A Little Help needed climbing 3-5 steps with a railing? : A Little 6 Click Score: 19    End of Session Equipment Utilized During Treatment: Gait belt Activity Tolerance: Patient tolerated treatment well Patient left: in chair;with family/visitor present Nurse Communication: Mobility status PT Visit Diagnosis: Other abnormalities of gait and mobility (R26.89);Muscle weakness (generalized) (M62.81)    Time: [id number] PT Time Calculation (min) (ACUTE ONLY): 32 min   Charges:  PT Evaluation $PT Eval Low Complexity: 1 Low PT Treatments $Therapeutic Exercise: 8-22 mins        Abran Richard, PT Acute Rehab [facility] [phone]   Randall Torres 10/28/2022, 2:56 PM

## 2022-10-29 ENCOUNTER — Encounter (HOSPITAL_COMMUNITY): Payer: Self-pay | Admitting: Orthopedic Surgery

## 2022-11-07 ENCOUNTER — Ambulatory Visit: Payer: Federal, State, Local not specified - PPO | Admitting: Psychology

## 2022-11-07 DIAGNOSIS — F331 Major depressive disorder, recurrent, moderate: Secondary | ICD-10-CM

## 2022-11-07 NOTE — Progress Notes (Signed)
[place] Counselor Initial Adult Exam  Name: Randall Torres Date: 11/07/2022 MRN: [id number] DOB: 1951/09/03 PCP: Burnis Medin, MD      Guardian/Payee:  N/A    Paperwork requested: No   Reason for Visit /Presenting Problem: Seeking to reduce symptoms of anxiety and depression  Mental Status Exam: Appearance:   Casual     Behavior:  Appropriate  Motor:  Normal  Speech/Language:   Normal Rate  Affect:  Appropriate  Mood:  normal  Thought process:  normal  Thought content:    WNL  Sensory/Perceptual disturbances:    WNL  Orientation:  oriented to person, place, and situation  Attention:  Good  Concentration:  Good  Memory:  WNL  Fund of knowledge:   Good  Insight:    Good  Judgment:   Good  Impulse Control:  Good    Reported Symptoms:  sadness, depleted self esteem, lack of motivation, agitation/worry (anxiety),  Risk Assessment: Danger to Self:  No Self-injurious Behavior: No Danger to Others: No Duty to Warn:no Physical Aggression / Violence:No  Access to Firearms a concern: No  Gang Involvement:No  Patient / guardian was educated about steps to take if suicide or homicide risk level increases between visits: yes While future psychiatric events cannot be accurately predicted, the patient does not currently require acute inpatient psychiatric care and does not currently meet [facility] involuntary commitment criteria.  Substance Abuse History: Current substance abuse: Yes     Past Psychiatric History:   Previous psychological history is significant for depression and alcohol abuse Outpatient Providers:unknown History of Psych Hospitalization: No  Psychological Testing:  unknown    Abuse History:  Victim of: No.,  N/A    Report needed: No. Victim of Neglect:No. Perpetrator of  N/A   Witness / Exposure to Domestic Violence: No   Protective Services Involvement: No  Witness to [employer] Violence:  No   Family History:   Family History  Problem Relation Age of Onset   Diabetes Other    Other Other        CVA age 49   Other Other        sleep problems   Myasthenia gravis Brother 102       deceased   Hyperlipidemia Other    Alcohol abuse Other        Uncle   Other Mother    Stroke Father    Diabetes Father    Throat cancer Sister    Breast cancer Sister    Healthy Son    Neuropathy Neg Hx     Living situation: the patient lives with their spouse  Sexual Orientation: Straight  Relationship Status: married  Name of spouse / other:unknown If a parent, number of children / ages:2 boys, 66 and 60  Support Systems: spouse  Museum/gallery curator Stress:  No   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 History of legal issue / charges:  N/A  Medical History/Surgical History: reviewed Past Medical History:  Diagnosis Date   ADD (attention deficit disorder)    ADJUSTMENT DISORDER 09/29/2007  Qualifier: Diagnosis of  By: Regis Bill MD, Standley Brooking    BPH (benign prostatic hyperplasia)    has seen urologist   Hyperlipidemia    Inguinal hernia    bilateral   Personal history of colonic adenoma 06/30/2008   Personal history of COVID-04 October 2020   Rhinitis 12/31/2010    Past Surgical History:  Procedure Laterality Date   CYSTOSCOPY     INGUINAL HERNIA REPAIR Bilateral 10/18/2014   Procedure: LAPAROSCOPIC BILATERAL INGUINAL HERNIA REPAIR  ;  Surgeon: Fanny Skates, MD;  Location: [facility];  Service: General;   Laterality: Bilateral;   INGUINAL HERNIA REPAIR Right 07/13/2020   Procedure: RIGHT INGUINAL HERNIA REPAIR;  Surgeon: Alphonsa Overall, MD;  Location: [place];  Service: General;  Laterality: Right;   INSERTION OF MESH Bilateral 10/18/2014   Procedure: INSERTION OF MESH;  Surgeon: Fanny Skates, MD;  Location: [facility];  Service: General;  Laterality: Bilateral;   INSERTION OF MESH Right 07/13/2020   Procedure: INSERTION OF MESH;  Surgeon: Alphonsa Overall, MD;  Location: [place];  Service: General;  Laterality: Right;   KNEE ARTHROSCOPY     TOTAL KNEE ARTHROPLASTY  11/08   medial compartmental    TOTAL KNEE ARTHROPLASTY Right 10/28/2022   Procedure: RIGHT TOTAL KNEE ARTHROPLASTY;  Surgeon: Frederik Pear, MD;  Location: [facility];  Service: Orthopedics;  Laterality: Right;    Medications: Current Outpatient Medications  Medication Sig Dispense Refill   aspirin EC 81 MG tablet Take 1 tablet (81 mg total) by mouth daily. 30 tablet 2   MELATONIN PO Take 1 tablet by mouth at bedtime as needed (sleep).     simvastatin (ZOCOR) 40 MG tablet TAKE 1 TABLET(40 MG) BY MOUTH DAILY AT 6 PM (Patient taking differently: Take 40 mg by mouth daily.) 90 tablet 3   solifenacin (VESICARE) 10 MG tablet Take 10 mg by mouth daily.     tamsulosin (FLOMAX) 0.4 MG CAPS capsule TAKE 1 CAPSULE(0.4 MG) BY MOUTH DAILY 90 capsule 0   tizanidine (ZANAFLEX) 2 MG capsule Take 2 capsules (4 mg total) by mouth 3 (three) times daily as needed for muscle spasms. 60 capsule 0   No current facility-administered medications for this visit.    No Known Allergies        Goals: Engage in outpatient psychotherapy to manage anxiety and reduce symptoms of depression (sadness, depleted esteem, low motivation). Will continue evaluation (goal date 9-31) and reduce reported symptoms (goal date 6-24). Diagnoses:  Depression, Gen Anxiety  Plan of Care: Outpatient Psychotherapy Patient was seen in the provider's  office for session. Session Notes: He had his knee surgery last week and recovery is going very well. He is feeling guilty that he did not remind his doctor that he is a recovering addict and accepted a refill of pain pills. He gave the pills to his wife so he has no control over taking the meds. In spite of being told that he is among the top percent of people recovering from this type of surgery, he still feels like he is not doing enough. He still feels he needs to push himself further. We talked about gratitude and accepting that he is doing well. Called his sister, whom he hasn't talked to in months, for support. She was, from his perspective, irritated that he had unrealistic expectations and the he was already beyond her in his recovery. She had surgery and still hasn't left the house while he is  walking a mile a day. Discussed calling her back and trying to smooth over their disconnect.                                           Marcelina Morel, PhD Time Spent: [id number] 45 minutes

## 2022-11-13 ENCOUNTER — Ambulatory Visit: Payer: Federal, State, Local not specified - PPO | Admitting: Psychology

## 2022-11-20 ENCOUNTER — Ambulatory Visit: Payer: Federal, State, Local not specified - PPO | Admitting: Psychology

## 2022-11-22 ENCOUNTER — Other Ambulatory Visit: Payer: Self-pay | Admitting: Internal Medicine

## 2022-11-29 ENCOUNTER — Ambulatory Visit: Payer: Federal, State, Local not specified - PPO | Admitting: Psychology

## 2022-11-29 DIAGNOSIS — F331 Major depressive disorder, recurrent, moderate: Secondary | ICD-10-CM

## 2022-11-29 NOTE — Progress Notes (Signed)
[place] Counselor Initial Adult Exam  Name: Randall Torres Date: 11/29/2022 MRN: [id number] DOB: 11-05-1950 PCP: Burnis Medin, MD      Guardian/Payee:  N/A    Paperwork requested: No   Reason for Visit /Presenting Problem: Seeking to reduce symptoms of anxiety and depression  Mental Status Exam: Appearance:   Casual     Behavior:  Appropriate  Motor:  Normal  Speech/Language:   Normal Rate  Affect:  Appropriate  Mood:  normal  Thought process:  normal  Thought content:    WNL  Sensory/Perceptual disturbances:    WNL  Orientation:  oriented to person, place, and situation  Attention:  Good  Concentration:  Good  Memory:  WNL  Fund of knowledge:   Good  Insight:    Good  Judgment:   Good  Impulse Control:  Good    Reported Symptoms:  sadness, depleted self esteem, lack of motivation, agitation/worry (anxiety),  Risk Assessment: Danger to Self:  No Self-injurious Behavior: No Danger to Others: No Duty to Warn:no Physical Aggression / Violence:No  Access to Firearms a concern: No  Gang Involvement:No  Patient / guardian was educated about steps to take if suicide or homicide risk level increases between visits: yes While future psychiatric events cannot be accurately predicted, the patient does not currently require acute inpatient psychiatric care and does not currently meet [facility] involuntary commitment criteria.  Substance Abuse History: Current substance abuse: Yes     Past Psychiatric History:   Previous psychological history is significant for depression and alcohol abuse Outpatient Providers:unknown History of Psych Hospitalization: No  Psychological Testing:  unknown    Abuse History:  Victim of: No.,  N/A    Report needed: No. Victim of Neglect:No. Perpetrator of  N/A   Witness / Exposure to Domestic Violence: No   Protective Services Involvement: No  Witness to [employer] Violence:   No   Family History:  Family History  Problem Relation Age of Onset   Diabetes Other    Other Other        CVA age 87   Other Other        sleep problems   Myasthenia gravis Brother 30       deceased   Hyperlipidemia Other    Alcohol abuse Other        Uncle   Other Mother    Stroke Father    Diabetes Father    Throat cancer Sister    Breast cancer Sister    Healthy Son    Neuropathy Neg Hx     Living situation: the patient lives with their spouse  Sexual Orientation: Straight  Relationship Status: married  Name of spouse / other:unknown If a parent, number of children / ages:2 boys, 72 and 100  Support Systems: spouse  Museum/gallery curator Stress:  No   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 History of legal issue / charges:  N/A  Medical History/Surgical History: reviewed Past Medical History:  Diagnosis Date   ADD (attention deficit disorder)    ADJUSTMENT DISORDER 09/29/2007   Qualifier: Diagnosis of  By: Regis Bill MD, Standley Brooking    BPH (benign prostatic hyperplasia)    has seen urologist   Hyperlipidemia    Inguinal hernia    bilateral   Personal history of colonic adenoma 06/30/2008   Personal history of COVID-04 October 2020   Rhinitis 12/31/2010    Past Surgical History:  Procedure Laterality Date   CYSTOSCOPY     INGUINAL HERNIA REPAIR Bilateral 10/18/2014   Procedure: LAPAROSCOPIC BILATERAL INGUINAL HERNIA REPAIR  ;  Surgeon: Fanny Skates, MD;  Location: [facility];   Service: General;  Laterality: Bilateral;   INGUINAL HERNIA REPAIR Right 07/13/2020   Procedure: RIGHT INGUINAL HERNIA REPAIR;  Surgeon: Alphonsa Overall, MD;  Location: [place];  Service: General;  Laterality: Right;   INSERTION OF MESH Bilateral 10/18/2014   Procedure: INSERTION OF MESH;  Surgeon: Fanny Skates, MD;  [facility];  Service: General;  Laterality: Bilateral;   INSERTION OF MESH Right 07/13/2020   Procedure: INSERTION OF MESH;  Surgeon: Alphonsa Overall, MD;  Location: [place];  Service: General;  Laterality: Right;   KNEE ARTHROSCOPY     TOTAL KNEE ARTHROPLASTY  11/08   medial compartmental    TOTAL KNEE ARTHROPLASTY Right 10/28/2022   Procedure: RIGHT TOTAL KNEE ARTHROPLASTY;  Surgeon: Frederik Pear, MD;  [facility];  Service: Orthopedics;  Laterality: Right;    Medications: Current Outpatient Medications  Medication Sig Dispense Refill   aspirin EC 81 MG tablet Take 1 tablet (81 mg total) by mouth daily. 30 tablet 2   MELATONIN PO Take 1 tablet by mouth at bedtime as needed (sleep).     simvastatin (ZOCOR) 40 MG tablet TAKE 1 TABLET(40 MG) BY MOUTH DAILY AT 6 PM 90 tablet 3   solifenacin (VESICARE) 10 MG tablet Take 10 mg by mouth daily.     tamsulosin (FLOMAX) 0.4 MG CAPS capsule TAKE 1 CAPSULE(0.4 MG) BY MOUTH DAILY 90 capsule 0   tizanidine (ZANAFLEX) 2 MG capsule Take 2 capsules (4 mg total) by mouth 3 (three) times daily as needed for muscle spasms. 60 capsule 0   No current facility-administered medications for this visit.    No Known Allergies        Goals: Engage in outpatient psychotherapy to manage anxiety and reduce symptoms of depression (sadness, depleted esteem, low motivation). Will continue evaluation (goal date 9-31) and reduce reported symptoms (goal date 6-24). Diagnoses:  Depression, Gen Anxiety  Plan of Care: Outpatient Psychotherapy Patient was seen in the provider's office for session. Session Notes: He  says he is recovering well. He is working away from the pain meds, which is controlled by his wife. It has been a month and he is walking a mile each day. He is working toward acceptance of who he is and his circumstances. He is hoping to get to a place where he is more willing to accept his circumstances and not focus on disappointments. He has not been going to church daily as he was in the past. He tends to feel more stable when he starts his day with a service. I suggested he return to that ritual as time allows and he agrees it will be helpful. We also discussed his tendency to be self deprecating and to be mindful of when he falls into that pattern.  Marcelina Morel, PhD Time Spent: [id number] 45 minutes

## 2022-12-11 ENCOUNTER — Ambulatory Visit: Payer: Federal, State, Local not specified - PPO | Admitting: Psychology

## 2022-12-11 DIAGNOSIS — F331 Major depressive disorder, recurrent, moderate: Secondary | ICD-10-CM

## 2022-12-11 NOTE — Progress Notes (Signed)
[facility] Counselor Initial Adult Exam  Name: Randall Torres Date: 12/11/2022 MRN: [id number] DOB: [birth year] PCP: Burnis Medin, MD      Guardian/Payee:  N/A    Paperwork requested: No   Reason for Visit /Presenting Problem: Seeking to reduce symptoms of anxiety and depression  Mental Status Exam: Appearance:   Casual     Behavior:  Appropriate  Motor:  Normal  Speech/Language:   Normal Rate  Affect:  Appropriate  Mood:  normal  Thought process:  normal  Thought content:    WNL  Sensory/Perceptual disturbances:    WNL  Orientation:  oriented to person, place, and situation  Attention:  Good  Concentration:  Good  Memory:  WNL  Fund of knowledge:   Good  Insight:    Good  Judgment:   Good  Impulse Control:  Good    Reported Symptoms:  sadness, depleted self esteem, lack of motivation, agitation/worry (anxiety),  Risk Assessment: Danger to Self:  No Self-injurious Behavior: No Danger to Others: No Duty to Warn:no Physical Aggression / Violence:No  Access to Firearms a concern: No  Gang Involvement:No  Patient / guardian was educated about steps to take if suicide or homicide risk level increases between visits: yes While future psychiatric events cannot be accurately predicted, the patient does not currently require acute inpatient psychiatric care and does not currently meet [facility] involuntary commitment criteria.  Substance Abuse History: Current substance abuse: Yes     Past Psychiatric History:   Previous psychological history is significant for depression and alcohol abuse Outpatient Providers:unknown History of Psych Hospitalization: No  Psychological Testing:  unknown    Abuse History:  Victim of: No.,  N/A    Report needed: No. Victim of Neglect:No. Perpetrator of  N/A   Witness / Exposure to Domestic Violence: No   Protective Services Involvement: No  Witness to [employer] Violence:  No   Family History:   Family History  Problem Relation Age of Onset   Diabetes Other    Other Other        CVA age 66   Other Other        sleep problems   Myasthenia gravis Brother 50       deceased   Hyperlipidemia Other    Alcohol abuse Other        Uncle   Other Mother    Stroke Father    Diabetes Father    Throat cancer Sister    Breast cancer Sister    Healthy Son    Neuropathy Neg Hx     Living situation: the patient lives with their spouse  Sexual Orientation: Straight  Relationship Status: married  Name of spouse / other:unknown If a parent, number of children / ages:2 boys, 53 and 74  Support Systems: spouse  Museum/gallery curator Stress:  No   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 History of legal issue / charges:  N/A  Medical History/Surgical History: reviewed Past Medical History:  Diagnosis Date   ADD (attention deficit disorder)    ADJUSTMENT DISORDER 09/29/2007  Qualifier: Diagnosis of  By: Regis Bill MD, Standley Brooking    BPH (benign prostatic hyperplasia)    has seen urologist   Hyperlipidemia    Inguinal hernia    bilateral   Personal history of colonic adenoma 06/30/2008   Personal history of COVID-04 October 2020   Rhinitis 12/31/2010    Past Surgical History:  Procedure Laterality Date   CYSTOSCOPY     INGUINAL HERNIA REPAIR Bilateral 10/18/2014   Procedure: LAPAROSCOPIC BILATERAL INGUINAL HERNIA REPAIR  ;  Surgeon: Fanny Skates, MD;  Location: [facility];  Service: General;   Laterality: Bilateral;   INGUINAL HERNIA REPAIR Right 07/13/2020   Procedure: RIGHT INGUINAL HERNIA REPAIR;  Surgeon: Alphonsa Overall, MD;  Location: [facility];  Service: General;  Laterality: Right;   INSERTION OF MESH Bilateral 10/18/2014   Procedure: INSERTION OF MESH;  Surgeon: Fanny Skates, MD;  Location: [facility];  Service: General;  Laterality: Bilateral;   INSERTION OF MESH Right 07/13/2020   Procedure: INSERTION OF MESH;  Surgeon: Alphonsa Overall, MD;  Location: [place];  Service: General;  Laterality: Right;   KNEE ARTHROSCOPY     TOTAL KNEE ARTHROPLASTY  11/08   medial compartmental    TOTAL KNEE ARTHROPLASTY Right 10/28/2022   Procedure: RIGHT TOTAL KNEE ARTHROPLASTY;  Surgeon: Frederik Pear, MD;  Location: [facility];  Service: Orthopedics;  Laterality: Right;    Medications: Current Outpatient Medications  Medication Sig Dispense Refill   aspirin EC 81 MG tablet Take 1 tablet (81 mg total) by mouth daily. 30 tablet 2   MELATONIN PO Take 1 tablet by mouth at bedtime as needed (sleep).     simvastatin (ZOCOR) 40 MG tablet TAKE 1 TABLET(40 MG) BY MOUTH DAILY AT 6 PM 90 tablet 3   solifenacin (VESICARE) 10 MG tablet Take 10 mg by mouth daily.     tamsulosin (FLOMAX) 0.4 MG CAPS capsule TAKE 1 CAPSULE(0.4 MG) BY MOUTH DAILY 90 capsule 0   tizanidine (ZANAFLEX) 2 MG capsule Take 2 capsules (4 mg total) by mouth 3 (three) times daily as needed for muscle spasms. 60 capsule 0   No current facility-administered medications for this visit.    No Known Allergies        Goals: Engage in outpatient psychotherapy to manage anxiety and reduce symptoms of depression (sadness, depleted esteem, low motivation). Will continue evaluation (goal date 9-31) and reduce reported symptoms (goal date 6-24). Diagnoses:  Depression, Gen Anxiety  Plan of Care: Outpatient Psychotherapy Patient was seen in the provider's office for session. Session Notes: He states that he is  still recovering well from knee surgery. He does have some pain at night and he is very aware of the need to be careful with pain management due to his addiction. He has reduced the use of the pain meds. He was at an [facility] and was told "don't lie to your doctor". He went to his doctor and shared that he is in recovery. He felt that the information became the theme of the appointment. Konner was disappointed that the focus was not on his successful recovery, rather it was focused on how the doctor will manage the pain and medicines moving forward. We talked about the importance of being open with all information with his medical providers.  He is pleased he took my advice to reconnect with his brother. He reached out again to thank his brother and got back a very meaningful, heartfelt message. He is  encouraged that they will see each other again (it has been over 2 years). He talked about a relationship he had with a woman when he first got into AA. They became very close, but it never developed into an affair. It was headed in that direction, so he ended the relationship and went to another meeting location. He says there is still a lot of things missing in his marital relationship. He says that once he started writing e-mails to his adult kids, she told him that she lost all sexual interest in him.  We talked about acceptance of himself and where he is at this time. He will not address the marital issues at this time.                                                   Marcelina Morel, PhD Time Spent: 7:40a-8:30a 50 minutes

## 2023-01-08 ENCOUNTER — Ambulatory Visit: Payer: Federal, State, Local not specified - PPO | Admitting: Psychology

## 2023-01-08 DIAGNOSIS — F331 Major depressive disorder, recurrent, moderate: Secondary | ICD-10-CM

## 2023-01-08 NOTE — Progress Notes (Signed)
[facility] Counselor Initial Adult Exam  Name: Randall Torres Date: 01/08/2023 MRN: [id number] DOB: 01-Apr-1951 PCP: Madelin Headings, MD      Guardian/Payee:  N/A    Paperwork requested: No   Reason for Visit /Presenting Problem: Seeking to reduce symptoms of anxiety and depression  Mental Status Exam: Appearance:   Casual     Behavior:  Appropriate  Motor:  Normal  Speech/Language:   Normal Rate  Affect:  Appropriate  Mood:  normal  Thought process:  normal  Thought content:    WNL  Sensory/Perceptual disturbances:    WNL  Orientation:  oriented to person, place, and situation  Attention:  Good  Concentration:  Good  Memory:  WNL  Fund of knowledge:   Good  Insight:    Good  Judgment:   Good  Impulse Control:  Good    Reported Symptoms:  sadness, depleted self esteem, lack of motivation, agitation/worry (anxiety),  Risk Assessment: Danger to Self:  No Self-injurious Behavior: No Danger to Others: No Duty to Warn:no Physical Aggression / Violence:No  Access to Firearms a concern: No  Gang Involvement:No  Patient / guardian was educated about steps to take if suicide or homicide risk level increases between visits: yes While future psychiatric events cannot be accurately predicted, the patient does not currently require acute inpatient psychiatric care and does not currently meet [facility] involuntary commitment criteria.  Substance Abuse History: Current substance abuse: Yes     Past Psychiatric History:   Previous psychological history is significant for depression and alcohol abuse Outpatient Providers:unknown History of Psych Hospitalization: No  Psychological Testing:  unknown    Abuse History:  Victim of: No.,  N/A    Report needed: No. Victim of Neglect:No. Perpetrator of  N/A   Witness / Exposure to Domestic Violence: No   Protective Services Involvement: No  Witness to [employer] Violence:   No   Family History:  Family History  Problem Relation Age of Onset   Diabetes Other    Other Other        CVA age 2   Other Other        sleep problems   Myasthenia gravis Brother 32       deceased   Hyperlipidemia Other    Alcohol abuse Other        Uncle   Other Mother    Stroke Father    Diabetes Father    Throat cancer Sister    Breast cancer Sister    Healthy Son    Neuropathy Neg Hx     Living situation: the patient lives with their spouse  Sexual Orientation: Straight  Relationship Status: married  Name of spouse / other:unknown If a parent, number of children / ages:2 boys, 15 and 42  Support Systems: spouse  Surveyor, quantity Stress:  No   Income/Employment/Disability: Neurosurgeon:  unknown  Educational History: Education: post Engineer, maintenance (IT) work or degree  Religion/Sprituality/World View: Catholic  Any cultural differences that may affect / interfere with treatment:  N/A  Recreation/Hobbies: exercise walking  Stressors: Other: retirement    Strengths: Supportive Relationships, Family, and Church  Barriers:  unknown   Legal History: Pending legal issue / charges: The patient has no significant history of legal issues. History of legal issue / charges:  N/A  Medical History/Surgical History: reviewed Past Medical History:  Diagnosis  Date   ADD (attention deficit disorder)    ADJUSTMENT DISORDER 09/29/2007   Qualifier: Diagnosis of  By: Fabian Sharp MD, Neta Mends    BPH (benign prostatic hyperplasia)    has seen urologist   Hyperlipidemia    Inguinal hernia    bilateral   Personal history of colonic adenoma 06/30/2008   Personal history of COVID-04 October 2020   Rhinitis 12/31/2010    Past Surgical History:  Procedure Laterality Date   CYSTOSCOPY     INGUINAL HERNIA REPAIR Bilateral 10/18/2014   Procedure: LAPAROSCOPIC BILATERAL INGUINAL HERNIA REPAIR  ;  Surgeon: Claud Kelp, MD;  [facility];   Service: General;  Laterality: Bilateral;   INGUINAL HERNIA REPAIR Right 07/13/2020   Procedure: RIGHT INGUINAL HERNIA REPAIR;  Surgeon: Ovidio Kin, MD;  Location: [facility];  Service: General;  Laterality: Right;   INSERTION OF MESH Bilateral 10/18/2014   Procedure: INSERTION OF MESH;  Surgeon: Claud Kelp, MD;  [facility];  Service: General;  Laterality: Bilateral;   INSERTION OF MESH Right 07/13/2020   Procedure: INSERTION OF MESH;  Surgeon: Ovidio Kin, MD;  Location: [facility];  Service: General;  Laterality: Right;   KNEE ARTHROSCOPY     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 30 tablet 2   MELATONIN PO Take 1 tablet by mouth at bedtime as needed (sleep).     simvastatin (ZOCOR) 40 MG tablet TAKE 1 TABLET(40 MG) BY MOUTH DAILY AT 6 PM 90 tablet 3   solifenacin (VESICARE) 10 MG tablet Take 10 mg by mouth daily.     tamsulosin (FLOMAX) 0.4 MG CAPS capsule TAKE 1 CAPSULE(0.4 MG) BY MOUTH DAILY 90 capsule 0   tizanidine (ZANAFLEX) 2 MG capsule Take 2 capsules (4 mg total) by mouth 3 (three) times daily as needed for muscle spasms. 60 capsule 0   No current facility-administered medications for this visit.    No Known Allergies        Goals: Engage in outpatient psychotherapy to manage anxiety and reduce symptoms of depression (sadness, depleted esteem, low motivation). Will continue evaluation (goal date 9-31) and reduce reported symptoms (goal date 6-24). Diagnoses:  Depression, Gen Anxiety  Plan of Care: Outpatient Psychotherapy Patient was seen in the provider's office for session. Session Notes: He  states that he is going to increase frequency of visits because he has had some recent "issues". He says his wife is wanting to downsize their house, which is paid for and they have been there since 2003. He thinks she wants to buy a condo here and one near [place]. He feels he needs to be more flexible and open to her wishes. He is reacting to "change and logistics". Fears they will get in over their heads. He has not had a substantial discussion with wife about what she is wanting, because it is emotional for him. He wants to get to a point where he is not so anxious about the prospect of moving. This has brought up some of his mortality issues. He says "I talk a good game, but he really  has little trust or confidence that things will be okay". He questions whether he can "meet the challenge of life". We talked about his dread of having a discussion about each of their desires for how the future will look. He thinks she will not be accepting of differences. He thinks she is willing to talk, but has little tolerance for differences. He thinks she sees him as resistant to change. We discussed how to have the most productive discussion with his wife. He is anxious, but willing to have the conversation.                                                     Garrel Ridgel, PhD Time Spent: 8:40a-9:30a 50 minutes

## 2023-01-22 ENCOUNTER — Other Ambulatory Visit: Payer: Self-pay | Admitting: Family

## 2023-01-28 ENCOUNTER — Ambulatory Visit: Payer: Federal, State, Local not specified - PPO | Admitting: Psychology

## 2023-02-06 NOTE — Telephone Encounter (Signed)
Pt is still waiting on refill

## 2023-02-26 ENCOUNTER — Ambulatory Visit: Payer: Federal, State, Local not specified - PPO | Admitting: Psychology

## 2023-02-26 DIAGNOSIS — F331 Major depressive disorder, recurrent, moderate: Secondary | ICD-10-CM

## 2023-02-26 NOTE — Progress Notes (Signed)
[facility] Counselor Initial Adult Exam  Name: Randall Torres Date: 02/26/2023 MRN: [id number] DOB: [birth year] PCP: Madelin Headings, MD      Guardian/Payee:  N/A    Paperwork requested: No   Reason for Visit /Presenting Problem: Seeking to reduce symptoms of anxiety and depression  Mental Status Exam: Appearance:   Casual     Behavior:  Appropriate  Motor:  Normal  Speech/Language:   Normal Rate  Affect:  Appropriate  Mood:  normal  Thought process:  normal  Thought content:    WNL  Sensory/Perceptual disturbances:    WNL  Orientation:  oriented to person, place, and situation  Attention:  Good  Concentration:  Good  Memory:  WNL  Fund of knowledge:   Good  Insight:    Good  Judgment:   Good  Impulse Control:  Good    Reported Symptoms:  sadness, depleted self esteem, lack of motivation, agitation/worry (anxiety),  Risk Assessment: Danger to Self:  No Self-injurious Behavior: No Danger to Others: No Duty to Warn:no Physical Aggression / Violence:No  Access to Firearms a concern: No  Gang Involvement:No  Patient / guardian was educated about steps to take if suicide or homicide risk level increases between visits: yes While future psychiatric events cannot be accurately predicted, the patient does not currently require acute inpatient psychiatric care and does not currently meet [facility] involuntary commitment criteria.  Substance Abuse History: Current substance abuse: Yes     Past Psychiatric History:   Previous psychological history is significant for depression and alcohol abuse Outpatient Providers:unknown History of Psych Hospitalization: No  Psychological Testing:  unknown    Abuse History:  Victim of: No.,  N/A    Report needed: No. Victim of Neglect:No. Perpetrator of  N/A   Witness / Exposure to Domestic Violence: No   Protective Services Involvement: No  Witness to [employer] Violence:  No    Family History:  Family History  Problem Relation Age of Onset   Diabetes Other    Other Other        CVA age 51   Other Other        sleep problems   Myasthenia gravis Brother 32       deceased   Hyperlipidemia Other    Alcohol abuse Other        Uncle   Other Mother    Stroke Father    Diabetes Father    Throat cancer Sister    Breast cancer Sister    Healthy Son    Neuropathy Neg Hx     Living situation: the patient lives with their spouse  Sexual Orientation: Straight  Relationship Status: married  Name of spouse / other:unknown If a parent, number of children / ages:2 boys, 41 and 42  Support Systems: spouse  Surveyor, quantity Stress:  No   Income/Employment/Disability: Neurosurgeon:  unknown  Educational History: Education: post Engineer, maintenance (IT) work or degree  Religion/Sprituality/World View: Catholic  Any cultural differences that may affect / interfere with treatment:  N/A  Recreation/Hobbies: exercise walking  Stressors: Other: retirement    Strengths: Supportive Relationships, Family, and Church  Barriers:  unknown   Legal History: Pending legal issue / charges: The patient has no significant history of legal issues. History of legal issue / charges:  N/A  Medical History/Surgical History: reviewed Past Medical History:  Diagnosis Date   ADD (  attention deficit disorder)    ADJUSTMENT DISORDER 09/29/2007   Qualifier: Diagnosis of  By: Fabian Sharp MD, Neta Mends    BPH (benign prostatic hyperplasia)    has seen urologist   Hyperlipidemia    Inguinal hernia    bilateral   Personal history of colonic adenoma 06/30/2008   Personal history of COVID-04 October 2020   Rhinitis 12/31/2010    Past Surgical History:  Procedure Laterality Date   CYSTOSCOPY     INGUINAL HERNIA REPAIR Bilateral 10/18/2014   Procedure: LAPAROSCOPIC BILATERAL INGUINAL HERNIA REPAIR  ;  Surgeon: Claud Kelp, MD;  [facility]: [facility];   Service: General;  Laterality: Bilateral;   INGUINAL HERNIA REPAIR Right 07/13/2020   Procedure: RIGHT INGUINAL HERNIA REPAIR;  Surgeon: Ovidio Kin, MD;  Location: [facility];  Service: General;  Laterality: Right;   INSERTION OF MESH Bilateral 10/18/2014   Procedure: INSERTION OF MESH;  Surgeon: Claud Kelp, MD;  [facility];  Service: General;  Laterality: Bilateral;   INSERTION OF MESH Right 07/13/2020   Procedure: INSERTION OF MESH;  Surgeon: Ovidio Kin, MD;  Location: [facility];  Service: General;  Laterality: Right;   KNEE ARTHROSCOPY     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 30 tablet 2   MELATONIN PO Take 1 tablet by mouth at bedtime as needed (sleep).     simvastatin (ZOCOR) 40 MG tablet TAKE 1 TABLET(40 MG) BY MOUTH DAILY AT 6 PM 90 tablet 3   solifenacin (VESICARE) 10 MG tablet Take 10 mg by mouth daily.     tamsulosin (FLOMAX) 0.4 MG CAPS capsule TAKE 1 CAPSULE(0.4 MG) BY MOUTH DAILY 90 capsule 0   tizanidine (ZANAFLEX) 2 MG capsule Take 2 capsules (4 mg total) by mouth 3 (three) times daily as needed for muscle spasms. 60 capsule 0   No current facility-administered medications for this visit.    No Known Allergies        Goals: Engage in outpatient psychotherapy to manage anxiety and reduce symptoms of depression (sadness, depleted esteem, low motivation). Will continue evaluation (goal date 9-31) and reduce reported symptoms (goal date 12-24). Diagnoses:  Depression, Gen Anxiety  Plan of Care: Outpatient Psychotherapy Patient was seen in the provider's office for session. Session  Notes: He states that he and his wife are flying to [facility] tomorrow for a wedding of the daughter of a close friend. Va is not very excited about going. He says "I am good in spurts" but hard to maintain a front for a long time. He feels it takes too much energy to "be on" for too long. Told him to take tie for himself to manage his anxiety. He is working on being more careful about what he says to people, and is invested in "doing the right thing".  He talked about his relationship with his wife and the lack of affection. He misses it, but says that his wife does not share in that need. He says that he would never divorce and his wife would never consider counseling. He says. "I have already lived most of my life" and there  are some things more important than "just being happy". Feels he married for "better or worse" and he feels it is his obligation to stand by his wife. His values keep him from taking action.                                                         Garrel Ridgel, PhD Time Spent: 11:40a-12:30p 50 minutes

## 2023-03-05 ENCOUNTER — Ambulatory Visit: Payer: Federal, State, Local not specified - PPO | Admitting: Psychology

## 2023-03-07 ENCOUNTER — Ambulatory Visit: Payer: Federal, State, Local not specified - PPO | Admitting: Psychology

## 2023-03-19 ENCOUNTER — Ambulatory Visit: Payer: Federal, State, Local not specified - PPO | Admitting: Psychology

## 2023-03-19 DIAGNOSIS — F331 Major depressive disorder, recurrent, moderate: Secondary | ICD-10-CM

## 2023-03-19 NOTE — Progress Notes (Signed)
[facility] Counselor Initial Adult Exam  Name: Randall Torres Date: 03/19/2023 MRN: [id number] DOB: 1951/05/07 PCP: Madelin Headings, MD      Guardian/Payee:  N/A    Paperwork requested: No   Reason for Visit /Presenting Problem: Seeking to reduce symptoms of anxiety and depression  Mental Status Exam: Appearance:   Casual     Behavior:  Appropriate  Motor:  Normal  Speech/Language:   Normal Rate  Affect:  Appropriate  Mood:  normal  Thought process:  normal  Thought content:    WNL  Sensory/Perceptual disturbances:    WNL  Orientation:  oriented to person, place, and situation  Attention:  Good  Concentration:  Good  Memory:  WNL  Fund of knowledge:   Good  Insight:    Good  Judgment:   Good  Impulse Control:  Good    Reported Symptoms:  sadness, depleted self esteem, lack of motivation, agitation/worry (anxiety),  Risk Assessment: Danger to Self:  No Self-injurious Behavior: No Danger to Others: No Duty to Warn:no Physical Aggression / Violence:No  Access to Firearms a concern: No  Gang Involvement:No  Patient / guardian was educated about steps to take if suicide or homicide risk level increases between visits: yes While future psychiatric events cannot be accurately predicted, the patient does not currently require acute inpatient psychiatric care and does not currently meet [facility] involuntary commitment criteria.  Substance Abuse History: Current substance abuse: Yes     Past Psychiatric History:   Previous psychological history is significant for depression and alcohol abuse Outpatient Providers:unknown History of Psych Hospitalization: No  Psychological Testing:  unknown    Abuse History:  Victim of: No.,  N/A    Report needed: No. Victim of Neglect:No. Perpetrator of  N/A   Witness / Exposure to Domestic Violence: No   Protective Services Involvement: No  Witness to  [employer] Violence:  No   Family History:  Family History  Problem Relation Age of Onset   Diabetes Other    Other Other        CVA age 16   Other Other        sleep problems   Myasthenia gravis Brother 32       deceased   Hyperlipidemia Other    Alcohol abuse Other        Uncle   Other Mother    Stroke Father    Diabetes Father    Throat cancer Sister    Breast cancer Sister    Healthy Son    Neuropathy Neg Hx     Living situation: the patient lives with their spouse  Sexual Orientation: Straight  Relationship Status: married  Name of spouse / other:unknown If a parent, number of children / ages:2 boys, 45 and 42  Support Systems: spouse  Surveyor, quantity Stress:  No   Income/Employment/Disability: Neurosurgeon:  unknown  Educational History: Education: post Engineer, maintenance (IT) work or degree  Religion/Sprituality/World View: Catholic  Any cultural differences that may affect / interfere with treatment:  N/A  Recreation/Hobbies: exercise walking  Stressors: Other: retirement    Strengths: Supportive Relationships, Family, and Church  Barriers:  unknown   Legal History: Pending legal issue / charges: The patient has no significant history of legal issues. History of legal issue / charges:  N/A  Medical History/Surgical History: reviewed Past Medical History:  Diagnosis Date   ADD (attention deficit disorder)    ADJUSTMENT DISORDER 09/29/2007   Qualifier: Diagnosis of  By: Fabian Sharp MD, Neta Mends    BPH (benign prostatic hyperplasia)    has seen urologist   Hyperlipidemia    Inguinal hernia    bilateral   Personal history of colonic adenoma 06/30/2008   Personal history of COVID-04 October 2020   Rhinitis 12/31/2010    Past Surgical History:  Procedure Laterality Date   CYSTOSCOPY     INGUINAL HERNIA REPAIR Bilateral 10/18/2014   Procedure: LAPAROSCOPIC BILATERAL INGUINAL HERNIA REPAIR  ;  Surgeon: Claud Kelp, MD;   Location: [facility];  Service: General;  Laterality: Bilateral;   INGUINAL HERNIA REPAIR Right 07/13/2020   Procedure: RIGHT INGUINAL HERNIA REPAIR;  Surgeon: Ovidio Kin, MD;  Location: [facility];  Service: General;  Laterality: Right;   INSERTION OF MESH Bilateral 10/18/2014   Procedure: INSERTION OF MESH;  Surgeon: Claud Kelp, MD;  Location: [facility];  Service: General;  Laterality: Bilateral;   INSERTION OF MESH Right 07/13/2020   Procedure: INSERTION OF MESH;  Surgeon: Ovidio Kin, MD;  Location: [facility];  Service: General;  Laterality: Right;   KNEE ARTHROSCOPY     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 30 tablet 2   MELATONIN PO Take 1 tablet by mouth at bedtime as needed (sleep).     simvastatin (ZOCOR) 40 MG tablet TAKE 1 TABLET(40 MG) BY MOUTH DAILY AT 6 PM 90 tablet 3   solifenacin (VESICARE) 10 MG tablet Take 10 mg by mouth daily.     tamsulosin (FLOMAX) 0.4 MG CAPS capsule TAKE 1 CAPSULE(0.4 MG) BY MOUTH DAILY 90 capsule 0   tizanidine (ZANAFLEX) 2 MG capsule Take 2 capsules (4 mg total) by mouth 3 (three) times daily as needed for muscle spasms. 60 capsule 0   No current facility-administered medications for this visit.    No Known Allergies        Goals: Engage in outpatient psychotherapy to manage anxiety and reduce symptoms of depression (sadness, depleted esteem, low motivation). Will continue evaluation (goal date 9-31) and reduce reported symptoms (goal date 12-24). Diagnoses:  Depression, Gen Anxiety  Plan of Care: Outpatient Psychotherapy Patient was seen in the provider's office for  session. Session Notes: He states that he feels bad and apologized for what he felt was rude or resistant behavior on his part. I did not interpret his behavior as rude, but he is difficult on himself. Aug. 7th is 14 years sober and he says every July he has a strong reaction.         July 14 years ago he says he was in bad shape and he reflects on that time. He tolerates the month by his reliance on G-d. We talked about the way to move past the angst he often experiences during the month. He strives to not over-react, especially with family. Cognitive strategies to help him manage. We also addressed the importance of not "giving up" during the month.  Garrel Ridgel, PhD Time Spent: 11:40a-12:30p 50 minutes

## 2023-04-02 ENCOUNTER — Ambulatory Visit: Payer: Federal, State, Local not specified - PPO | Admitting: Psychology

## 2023-04-02 DIAGNOSIS — F331 Major depressive disorder, recurrent, moderate: Secondary | ICD-10-CM

## 2023-04-02 NOTE — Progress Notes (Signed)
[facility] Counselor Initial Adult Exam  Name: Randall Torres Date: 04/02/2023 MRN: [id number] DOB: [birth year] PCP: Madelin Headings, MD      Guardian/Payee:  N/A    Paperwork requested: No   Reason for Visit /Presenting Problem: Seeking to reduce symptoms of anxiety and depression  Mental Status Exam: Appearance:   Casual     Behavior:  Appropriate  Motor:  Normal  Speech/Language:   Normal Rate  Affect:  Appropriate  Mood:  normal  Thought process:  normal  Thought content:    WNL  Sensory/Perceptual disturbances:    WNL  Orientation:  oriented to person, place, and situation  Attention:  Good  Concentration:  Good  Memory:  WNL  Fund of knowledge:   Good  Insight:    Good  Judgment:   Good  Impulse Control:  Good    Reported Symptoms:  sadness, depleted self esteem, lack of motivation, agitation/worry (anxiety),  Risk Assessment: Danger to Self:  No Self-injurious Behavior: No Danger to Others: No Duty to Warn:no Physical Aggression / Violence:No  Access to Firearms a concern: No  Gang Involvement:No  Patient / guardian was educated about steps to take if suicide or homicide risk level increases between visits: yes While future psychiatric events cannot be accurately predicted, the patient does not currently require acute inpatient psychiatric care and does not currently meet [facility] involuntary commitment criteria.  Substance Abuse History: Current substance abuse: Yes     Past Psychiatric History:   Previous psychological history is significant for depression and alcohol abuse Outpatient Providers:unknown History of Psych Hospitalization: No  Psychological Testing:  unknown    Abuse History:  Victim of: No.,  N/A    Report needed: No. Victim of Neglect:No. Perpetrator of  N/A   Witness / Exposure to Domestic Violence: No   Protective  Services Involvement: No  Witness to [employer] Violence:  No   Family History:  Family History  Problem Relation Age of Onset   Diabetes Other    Other Other        CVA age 89   Other Other        sleep problems   Myasthenia gravis Brother 32       deceased   Hyperlipidemia Other    Alcohol abuse Other        Uncle   Other Mother    Stroke Father    Diabetes Father    Throat cancer Sister    Breast cancer Sister    Healthy Son    Neuropathy Neg Hx     Living situation: the patient lives with their spouse  Sexual Orientation: Straight  Relationship Status: married  Name of spouse / other:unknown If a parent, number of children / ages:2 boys, 58 and 42  Support Systems: spouse  Surveyor, quantity Stress:  No   Income/Employment/Disability: Neurosurgeon:  unknown  Educational History: Education: post Engineer, maintenance (IT) work or degree  Religion/Sprituality/World View: Catholic  Any cultural differences that may affect / interfere with treatment:  N/A  Recreation/Hobbies: exercise walking  Stressors: Other: retirement    Strengths: Supportive Relationships, Family, and Church  Barriers:  unknown   Legal History: Pending legal issue / charges: The  patient has no significant history of legal issues. History of legal issue / charges:  N/A  Medical History/Surgical History: reviewed Past Medical History:  Diagnosis Date   ADD (attention deficit disorder)    ADJUSTMENT DISORDER 09/29/2007   Qualifier: Diagnosis of  By: Fabian Sharp MD, Neta Mends    BPH (benign prostatic hyperplasia)    has seen urologist   Hyperlipidemia    Inguinal hernia    bilateral   Personal history of colonic adenoma 06/30/2008   Personal history of COVID-04 October 2020   Rhinitis 12/31/2010    Past Surgical History:  Procedure Laterality Date   CYSTOSCOPY     INGUINAL HERNIA REPAIR Bilateral 10/18/2014   Procedure: LAPAROSCOPIC BILATERAL INGUINAL HERNIA  REPAIR  ;  Surgeon: Claud Kelp, MD;  Location: [facility];  Service: General;  Laterality: Bilateral;   INGUINAL HERNIA REPAIR Right 07/13/2020   Procedure: RIGHT INGUINAL HERNIA REPAIR;  Surgeon: Ovidio Kin, MD;  Location: [facility];  Service: General;  Laterality: Right;   INSERTION OF MESH Bilateral 10/18/2014   Procedure: INSERTION OF MESH;  Surgeon: Claud Kelp, MD;  Location: [facility];  Service: General;  Laterality: Bilateral;   INSERTION OF MESH Right 07/13/2020   Procedure: INSERTION OF MESH;  Surgeon: Ovidio Kin, MD;  [facility];  Service: General;  Laterality: Right;   KNEE ARTHROSCOPY     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 30 tablet 2   MELATONIN PO Take 1 tablet by mouth at bedtime as needed (sleep).     simvastatin (ZOCOR) 40 MG tablet TAKE 1 TABLET(40 MG) BY MOUTH DAILY AT 6 PM 90 tablet 3   solifenacin (VESICARE) 10 MG tablet Take 10 mg by mouth daily.     tamsulosin (FLOMAX) 0.4 MG CAPS capsule TAKE 1 CAPSULE(0.4 MG) BY MOUTH DAILY 90 capsule 0   tizanidine (ZANAFLEX) 2 MG capsule Take 2 capsules (4 mg total) by mouth 3 (three) times daily as needed for muscle spasms. 60 capsule 0   No current facility-administered medications for this visit.    No Known Allergies        Goals: Engage in outpatient psychotherapy to manage anxiety and reduce symptoms of depression (sadness, depleted esteem, low motivation). Will continue evaluation (goal date 9-31) and reduce reported symptoms (goal date 12-24). Diagnoses:  Depression, Gen Anxiety  Plan of Care: Outpatient Psychotherapy Patient was  seen in the provider's office for session. Session Notes: He states that "your advice was golden" last session regarding "hitting the pause button" rather than impulsively react to wife. He feels she is not as flexible as she was in the past and is more argumentative. He says that he met with his ex-brother in law for 1 1/2 hours yesterday. There was a situation that he experienced different than his wife and she was a bit critical. The month of July, which he typically dreads, has been far better than anticipated. Aug. 7th is the 14th anniversary of his getting sober. He expressed his appreciation for advice he has found to be helpful. We talked about his soer anniversary and ways to celebrate/acknowledge.  Garrel Ridgel, PhD Time Spent: 11:40a-12:30p 50 minutes

## 2023-04-14 ENCOUNTER — Encounter: Payer: Self-pay | Admitting: Family Medicine

## 2023-04-14 ENCOUNTER — Other Ambulatory Visit (HOSPITAL_COMMUNITY): Payer: Self-pay

## 2023-04-14 ENCOUNTER — Telehealth: Payer: Federal, State, Local not specified - PPO | Admitting: Family Medicine

## 2023-04-14 VITALS — Ht 68.0 in | Wt 175.0 lb

## 2023-04-14 DIAGNOSIS — U071 COVID-19: Secondary | ICD-10-CM

## 2023-04-14 MED ORDER — NIRMATRELVIR/RITONAVIR (PAXLOVID)TABLET
3.0000 | ORAL_TABLET | Freq: Two times a day (BID) | ORAL | 0 refills | Status: AC
  Filled 2023-04-14: qty 30, 5d supply, fill #0

## 2023-04-14 MED ORDER — BENZONATATE 200 MG PO CAPS: 200.0000 mg | ORAL_CAPSULE | Freq: Four times a day (QID) | ORAL | 0 refills | Status: DC | PRN

## 2023-04-14 NOTE — Progress Notes (Signed)
Subjective:    Patient ID: Randall Torres, male    DOB: Jun 24, 1951, 72 y.o.   MRN: [id number]  HPI Virtual Visit via Video Note  I connected with the patient on 04/14/23 at  3:15 PM EDT by a video enabled telemedicine application and verified that I am speaking with the correct person using two identifiers.  Location patient: home Location provider:work or home office Persons participating in the virtual visit: patient, provider  I discussed the limitations of evaluation and management by telemedicine and the availability of in person appointments. The patient expressed understanding and agreed to proceed.   HPI: Here for 2 days of body aches, ST, and a dry cough. No fever or SOB. Taking Mucinex DM. He tested positive for the Covid virus last night.   ROS: See pertinent positives and negatives per HPI.  Past Medical History:  Diagnosis Date   ADD (attention deficit disorder)    ADJUSTMENT DISORDER 09/29/2007   Qualifier: Diagnosis of  By: Fabian Sharp MD, Neta Mends    BPH (benign prostatic hyperplasia)    has seen urologist   Hyperlipidemia    Inguinal hernia    bilateral   Personal history of colonic adenoma 06/30/2008   Personal history of COVID-04 October 2020   Rhinitis 12/31/2010    Past Surgical History:  Procedure Laterality Date   CYSTOSCOPY     INGUINAL HERNIA REPAIR Bilateral 10/18/2014   Procedure: LAPAROSCOPIC BILATERAL INGUINAL HERNIA REPAIR  ;  Surgeon: Claud Kelp, MD;  Location: [facility];  Service: General;  Laterality: Bilateral;   INGUINAL HERNIA REPAIR Right 07/13/2020   Procedure: RIGHT INGUINAL HERNIA REPAIR;  Surgeon: Ovidio Kin, MD;  Location: [facility];  Service: General;  Laterality: Right;   INSERTION OF MESH Bilateral 10/18/2014   Procedure: INSERTION OF MESH;  Surgeon: Claud Kelp, MD;  Location: [facility];  Service: General;  Laterality: Bilateral;   INSERTION OF MESH Right 07/13/2020   Procedure: INSERTION OF MESH;  Surgeon:  Ovidio Kin, MD;  Location: [facility];  Service: General;  Laterality: Right;   KNEE ARTHROSCOPY     TOTAL KNEE ARTHROPLASTY  11/08   medial compartmental    TOTAL KNEE ARTHROPLASTY Right 10/28/2022   Procedure: RIGHT TOTAL KNEE ARTHROPLASTY;  Surgeon: Gean Birchwood, MD;  Location: [facility];  Service: Orthopedics;  Laterality: Right;    Family History  Problem Relation Age of Onset   Diabetes Other    Other Other        CVA age 79   Other Other        sleep problems   Myasthenia gravis Brother 32       deceased   Hyperlipidemia Other    Alcohol abuse Other        Uncle   Other Mother    Stroke Father    Diabetes Father    Throat cancer Sister    Breast cancer Sister    Healthy Son    Neuropathy Neg Hx      Current Outpatient Medications:    benzonatate (TESSALON) 200 MG capsule, Take 1 capsule (200 mg total) by mouth every 6 (six) hours as needed for cough., Disp: 60 capsule, Rfl: 0   desmopressin (DDAVP) 0.2 MG tablet, Take 400 mcg by mouth at bedtime., Disp: , Rfl:    nirmatrelvir/ritonavir (PAXLOVID) 20 x 150 MG & 10 x 100MG  TABS, Take 3 tablets by mouth 2 (two) times daily for 5 days. (Take nirmatrelvir  150 mg two tablets twice daily for 5 days and ritonavir 100 mg one tablet twice daily for 5 days) Patient GFR is 89, Disp: 30 tablet, Rfl: 0   simvastatin (ZOCOR) 40 MG tablet, TAKE 1 TABLET(40 MG) BY MOUTH DAILY AT 6 PM, Disp: 90 tablet, Rfl: 3   tamsulosin (FLOMAX) 0.4 MG CAPS capsule, TAKE 1 CAPSULE(0.4 MG) BY MOUTH DAILY, Disp: 90 capsule, Rfl: 0   solifenacin (VESICARE) 10 MG tablet, Take 10 mg by mouth daily. (Patient not taking: Reported on 04/14/2023), Disp: , Rfl:    tizanidine (ZANAFLEX) 2 MG capsule, Take 2 capsules (4 mg total) by mouth 3 (three) times daily as needed for muscle spasms. (Patient not taking: Reported on 04/14/2023), Disp: 60 capsule, Rfl: 0  EXAM:  VITALS per patient if applicable:  GENERAL: alert, oriented, appears well and in no  acute distress  HEENT: atraumatic, conjunttiva clear, no obvious abnormalities on inspection of external nose and ears  NECK: normal movements of the head and neck  LUNGS: on inspection no signs of respiratory distress, breathing rate appears normal, no obvious gross SOB, gasping or wheezing  CV: no obvious cyanosis  MS: moves all visible extremities without noticeable abnormality  PSYCH/NEURO: pleasant and cooperative, no obvious depression or anxiety, speech and thought processing grossly intact  ASSESSMENT AND PLAN: Covid infection, treat with 5 days of Paxlovid. Add Benzonatate for the cough. Gershon Crane, MD   Discussed the following assessment and plan:  No diagnosis found.     I discussed the assessment and treatment plan with the patient. The patient was provided an opportunity to ask questions and all were answered. The patient agreed with the plan and demonstrated an understanding of the instructions.   The patient was advised to call back or seek an in-person evaluation if the symptoms worsen or if the condition fails to improve as anticipated.      Review of Systems     Objective:   Physical Exam        Assessment & Plan:

## 2023-04-15 ENCOUNTER — Other Ambulatory Visit (HOSPITAL_COMMUNITY): Payer: Self-pay

## 2023-04-16 ENCOUNTER — Ambulatory Visit: Payer: Federal, State, Local not specified - PPO | Admitting: Psychology

## 2023-04-23 ENCOUNTER — Other Ambulatory Visit (HOSPITAL_COMMUNITY): Payer: Self-pay

## 2023-05-02 ENCOUNTER — Other Ambulatory Visit: Payer: Self-pay | Admitting: Family

## 2023-05-07 ENCOUNTER — Ambulatory Visit: Payer: Federal, State, Local not specified - PPO | Admitting: Psychology

## 2023-05-14 ENCOUNTER — Ambulatory Visit: Payer: Federal, State, Local not specified - PPO | Admitting: Psychology

## 2023-05-14 DIAGNOSIS — F331 Major depressive disorder, recurrent, moderate: Secondary | ICD-10-CM | POA: Diagnosis not present

## 2023-05-14 NOTE — Progress Notes (Signed)
[facility] Counselor Initial Adult Exam  Name: Randall Torres Date: 05/14/2023 MRN: [id number] DOB: 04-May-1951 PCP: Madelin Headings, MD      Guardian/Payee:  N/A    Paperwork requested: No   Reason for Visit /Presenting Problem: Seeking to reduce symptoms of anxiety and depression  Mental Status Exam: Appearance:   Casual     Behavior:  Appropriate  Motor:  Normal  Speech/Language:   Normal Rate  Affect:  Appropriate  Mood:  normal  Thought process:  normal  Thought content:    WNL  Sensory/Perceptual disturbances:    WNL  Orientation:  oriented to person, place, and situation  Attention:  Good  Concentration:  Good  Memory:  WNL  Fund of knowledge:   Good  Insight:    Good  Judgment:   Good  Impulse Control:  Good    Reported Symptoms:  sadness, depleted self esteem, lack of motivation, agitation/worry (anxiety),  Risk Assessment: Danger to Self:  No Self-injurious Behavior: No Danger to Others: No Duty to Warn:no Physical Aggression / Violence:No  Access to Firearms a concern: No  Gang Involvement:No  Patient / guardian was educated about steps to take if suicide or homicide risk level increases between visits: yes While future psychiatric events cannot be accurately predicted, the patient does not currently require acute inpatient psychiatric care and does not currently meet [facility] involuntary commitment criteria.  Substance Abuse History: Current substance abuse: Yes     Past Psychiatric History:   Previous psychological history is significant for depression and alcohol abuse Outpatient Providers:unknown History of Psych Hospitalization: No  Psychological Testing:  unknown    Abuse History:  Victim of: No.,  N/A    Report needed: No. Victim of Neglect:No. Perpetrator of  N/A   Witness / Exposure to Domestic  Violence: No   Protective Services Involvement: No  Witness to [employer] Violence:  No   Family History:  Family History  Problem Relation Age of Onset   Diabetes Other    Other Other        CVA age 50   Other Other        sleep problems   Myasthenia gravis Brother 32       deceased   Hyperlipidemia Other    Alcohol abuse Other        Uncle   Other Mother    Stroke Father    Diabetes Father    Throat cancer Sister    Breast cancer Sister    Healthy Son    Neuropathy Neg Hx     Living situation: the patient lives with their spouse  Sexual Orientation: Straight  Relationship Status: married  Name of spouse / other:unknown If a parent, number of children / ages:2 boys, 21 and 42  Support Systems: spouse  Surveyor, quantity Stress:  No   Income/Employment/Disability: Neurosurgeon:  unknown  Educational History: Education: post Engineer, maintenance (IT) work or degree  Religion/Sprituality/World View: Catholic  Any cultural differences that may affect / interfere with treatment:  N/A  Recreation/Hobbies: exercise walking  Stressors: Other: retirement    Strengths: Supportive Relationships, Family, and  Church  Barriers:  unknown   Legal History: Pending legal issue / charges: The patient has no significant history of legal issues. History of legal issue / charges:  N/A  Medical History/Surgical History: reviewed Past Medical History:  Diagnosis Date   ADD (attention deficit disorder)    ADJUSTMENT DISORDER 09/29/2007   Qualifier: Diagnosis of  By: Fabian Sharp MD, Neta Mends    BPH (benign prostatic hyperplasia)    has seen urologist   Hyperlipidemia    Inguinal hernia    bilateral   Personal history of colonic adenoma 06/30/2008   Personal history of COVID-04 October 2020   Rhinitis 12/31/2010    Past Surgical History:  Procedure Laterality Date   CYSTOSCOPY     INGUINAL HERNIA REPAIR Bilateral 10/18/2014   Procedure: LAPAROSCOPIC  BILATERAL INGUINAL HERNIA REPAIR  ;  Surgeon: Claud Kelp, MD;  Location: [facility];  Service: General;  Laterality: Bilateral;   INGUINAL HERNIA REPAIR Right 07/13/2020   Procedure: RIGHT INGUINAL HERNIA REPAIR;  Surgeon: Ovidio Kin, MD;  Location: [facility];  Service: General;  Laterality: Right;   INSERTION OF MESH Bilateral 10/18/2014   Procedure: INSERTION OF MESH;  Surgeon: Claud Kelp, MD;  Location: [facility];  Service: General;  Laterality: Bilateral;   INSERTION OF MESH Right 07/13/2020   Procedure: INSERTION OF MESH;  Surgeon: Ovidio Kin, MD;  Location: [facility];  Service: General;  Laterality: Right;   KNEE ARTHROSCOPY     TOTAL KNEE ARTHROPLASTY  11/08   medial compartmental    TOTAL KNEE ARTHROPLASTY Right 10/28/2022   Procedure: RIGHT TOTAL KNEE ARTHROPLASTY;  Surgeon: Gean Birchwood, MD;  Location: [facility];  Service: Orthopedics;  Laterality: Right;    Medications: Current Outpatient Medications  Medication Sig Dispense Refill   benzonatate (TESSALON) 200 MG capsule Take 1 capsule (200 mg total) by mouth every 6 (six) hours as needed for cough. 60 capsule 0   desmopressin (DDAVP) 0.2 MG tablet Take 400 mcg by mouth at bedtime.     simvastatin (ZOCOR) 40 MG tablet TAKE 1 TABLET(40 MG) BY MOUTH DAILY AT 6 PM 90 tablet 3   solifenacin (VESICARE) 10 MG tablet Take 10 mg by mouth daily. (Patient not taking: Reported on 04/14/2023)     tamsulosin (FLOMAX) 0.4 MG CAPS capsule TAKE 1 CAPSULE(0.4 MG) BY MOUTH DAILY 90 capsule 0   tizanidine (ZANAFLEX) 2 MG capsule Take 2 capsules (4 mg total) by mouth 3 (three) times daily as needed for muscle spasms. (Patient not taking: Reported on 04/14/2023) 60 capsule 0   No current facility-administered medications for this visit.    No Known Allergies        Goals: Engage in outpatient psychotherapy to manage anxiety and reduce symptoms of depression (sadness, depleted esteem, low motivation). Will continue  evaluation (goal date 9-31) and reduce reported symptoms (goal date 12-24). Diagnoses:  Depression, Gen Anxiety  Plan of Care: Outpatient Psychotherapy Patient was seen in the provider's office for session. Session Notes: He says that it was helpful that I had encouraged him to be aware of those things for which he feels gratitude. He has found that to be very helpful. Randall Torres went to [place] to take care of son's dog while son was out of town. He states he got a "flavor" of what it was like to be alone. While there, he walked dog, went to church and to [employer]  He talked about a group of "friends" who  they have dinner with every Friday night. He and his wife will be joining them along with a few of their friends on a trip to [place]. 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 Randall Torres says he agreed to go on this trip because this is his wife's social group. We discussed how he can make the most out of the trip and not get caught up in negativity and feel like a victim.  He says that his son reflected to him that he does not seem happy and suggested that he consider volunteering at legal aid. We had a discussion if this would be a good option for him.                                                      Garrel Ridgel, PhD Time Spent: 10:40a-11:30a 50 minutes

## 2023-05-15 ENCOUNTER — Encounter: Payer: Self-pay | Admitting: Internal Medicine

## 2023-05-16 MED ORDER — TAMSULOSIN HCL 0.4 MG PO CAPS: 0.4000 mg | ORAL_CAPSULE | Freq: Every day | ORAL | 0 refills | Status: DC

## 2023-05-20 ENCOUNTER — Encounter: Payer: Self-pay | Admitting: Internal Medicine

## 2023-05-21 ENCOUNTER — Ambulatory Visit: Payer: Federal, State, Local not specified - PPO | Admitting: Psychology

## 2023-05-22 NOTE — Telephone Encounter (Signed)
Looks like you already had the shingrix vaccine  and UTD on the pneumonia vaccine so nothing else needed

## 2023-06-04 ENCOUNTER — Ambulatory Visit: Payer: Federal, State, Local not specified - PPO | Admitting: Psychology

## 2023-06-11 ENCOUNTER — Encounter: Payer: Self-pay | Admitting: Family Medicine

## 2023-06-11 ENCOUNTER — Ambulatory Visit: Payer: Federal, State, Local not specified - PPO | Admitting: Psychology

## 2023-06-11 ENCOUNTER — Ambulatory Visit: Payer: Federal, State, Local not specified - PPO | Admitting: Family Medicine

## 2023-06-11 VITALS — BP 124/70 | HR 100 | Temp 98.0°F | Ht 68.0 in | Wt 175.9 lb

## 2023-06-11 DIAGNOSIS — F331 Major depressive disorder, recurrent, moderate: Secondary | ICD-10-CM

## 2023-06-11 DIAGNOSIS — T63441A Toxic effect of venom of bees, accidental (unintentional), initial encounter: Secondary | ICD-10-CM | POA: Diagnosis not present

## 2023-06-11 NOTE — Progress Notes (Signed)
[facility] Counselor Initial Adult Exam  Name: Randall Torres Date: 06/11/2023 MRN: [id number] DOB: [birth year] PCP: Madelin Headings, MD      Guardian/Payee:  N/A    Paperwork requested: No   Reason for Visit /Presenting Problem: Seeking to reduce symptoms of anxiety and depression  Mental Status Exam: Appearance:   Casual     Behavior:  Appropriate  Motor:  Normal  Speech/Language:   Normal Rate  Affect:  Appropriate  Mood:  normal  Thought process:  normal  Thought content:    WNL  Sensory/Perceptual disturbances:    WNL  Orientation:  oriented to person, place, and situation  Attention:  Good  Concentration:  Good  Memory:  WNL  Fund of knowledge:   Good  Insight:    Good  Judgment:   Good  Impulse Control:  Good    Reported Symptoms:  sadness, depleted self esteem, lack of motivation, agitation/worry (anxiety),  Risk Assessment: Danger to Self:  No Self-injurious Behavior: No Danger to Others: No Duty to Warn:no Physical Aggression / Violence:No  Access to Firearms a concern: No  Gang Involvement:No  Patient / guardian was educated about steps to take if suicide or homicide risk level increases between visits: yes While future psychiatric events cannot be accurately predicted, the patient does not currently require acute inpatient psychiatric care and does not currently meet [facility] involuntary commitment criteria.  Substance Abuse History: Current substance abuse: Yes     Past Psychiatric History:   Previous psychological history is significant for depression and alcohol abuse Outpatient Providers:unknown History of Psych Hospitalization: No  Psychological Testing:  unknown    Abuse History:  Victim of: No.,  N/A    Report needed: No. Victim of Neglect:No. Perpetrator of  N/A    Witness / Exposure to Domestic Violence: No   Protective Services Involvement: No  Witness to [employer] Violence:  No   Family History:  Family History  Problem Relation Age of Onset   Diabetes Other    Other Other        CVA age 5   Other Other        sleep problems   Myasthenia gravis Brother 32       deceased   Hyperlipidemia Other    Alcohol abuse Other        Uncle   Other Mother    Stroke Father    Diabetes Father    Throat cancer Sister    Breast cancer Sister    Healthy Son    Neuropathy Neg Hx     Living situation: the patient lives with their spouse  Sexual Orientation: Straight  Relationship Status: married  Name of spouse / other:unknown If a parent, number of children / ages:2 boys, 61 and 42  Support Systems: spouse  Surveyor, quantity Stress:  No   Income/Employment/Disability: Neurosurgeon:  unknown  Educational History: Education: post Engineer, maintenance (IT) work or degree  Religion/Sprituality/World View: Catholic  Any cultural differences that may affect / interfere with treatment:  N/A  Recreation/Hobbies: exercise walking  Stressors: Other: retirement    Strengths: Supportive Relationships, Family, and Church  Barriers:  unknown   Legal History: Pending legal issue / charges: The patient has no significant history of legal issues. History of legal issue / charges:  N/A  Medical History/Surgical History: reviewed Past Medical History:  Diagnosis Date   ADD (attention deficit disorder)    ADJUSTMENT DISORDER 09/29/2007   Qualifier: Diagnosis of  By: Fabian Sharp MD, Neta Mends    BPH (benign prostatic hyperplasia)    has seen urologist   Hyperlipidemia    Inguinal hernia    bilateral   Personal history of colonic adenoma 06/30/2008   Personal history of COVID-04 October 2020   Rhinitis 12/31/2010    Past Surgical History:  Procedure Laterality Date   CYSTOSCOPY     INGUINAL HERNIA REPAIR Bilateral  10/18/2014   Procedure: LAPAROSCOPIC BILATERAL INGUINAL HERNIA REPAIR  ;  Surgeon: Claud Kelp, MD;  Location: [facility];  Service: General;  Laterality: Bilateral;   INGUINAL HERNIA REPAIR Right 07/13/2020   Procedure: RIGHT INGUINAL HERNIA REPAIR;  Surgeon: Ovidio Kin, MD;  Location: [facility];  Service: General;  Laterality: Right;   INSERTION OF MESH Bilateral 10/18/2014   Procedure: INSERTION OF MESH;  Surgeon: Claud Kelp, MD;  Location: [facility];  Service: General;  Laterality: Bilateral;   INSERTION OF MESH Right 07/13/2020   Procedure: INSERTION OF MESH;  Surgeon: Ovidio Kin, MD;  Location: [facility];  Service: General;  Laterality: Right;   KNEE ARTHROSCOPY     TOTAL KNEE ARTHROPLASTY  11/08   medial compartmental    TOTAL KNEE ARTHROPLASTY Right 10/28/2022   Procedure: RIGHT TOTAL KNEE ARTHROPLASTY;  Surgeon: Gean Birchwood, MD;  [facility]: [facility];  Service: Orthopedics;  Laterality: Right;    Medications: Current Outpatient Medications  Medication Sig Dispense Refill   benzonatate (TESSALON) 200 MG capsule Take 1 capsule (200 mg total) by mouth every 6 (six) hours as needed for cough. 60 capsule 0   desmopressin (DDAVP) 0.2 MG tablet Take 400 mcg by mouth at bedtime.     simvastatin (ZOCOR) 40 MG tablet TAKE 1 TABLET(40 MG) BY MOUTH DAILY AT 6 PM 90 tablet 3   solifenacin (VESICARE) 10 MG tablet Take 10 mg by mouth daily. (Patient not taking: Reported on 04/14/2023)     tamsulosin (FLOMAX) 0.4 MG CAPS capsule Take 1 capsule (0.4 mg total) by mouth daily. 90 capsule 0   tizanidine (ZANAFLEX) 2 MG capsule Take 2 capsules (4 mg total) by mouth 3 (three) times daily as needed for muscle spasms. (Patient not taking: Reported on 04/14/2023) 60 capsule 0   No current facility-administered medications for this visit.    No Known Allergies        Goals: Engage in outpatient psychotherapy to manage anxiety and reduce symptoms of depression (sadness,  depleted esteem, low motivation). Will continue evaluation (goal date 9-31) and reduce reported symptoms (goal date 12-24). Diagnoses:  Depression, Gen Anxiety  Plan of Care: Outpatient Psychotherapy Patient was seen in the provider's office for session. Session Notes: He says that the last time here, we talked about the trip with wife's friends and his concerns. He said the trip to [place] with them went well and was mostly enjoyable. They had one "disconnect" that created tension with each other, but they worked past it. Another incident was distressing for Doran when he said something to a woman on the trip that  she found offensive. He felt bad about it and apologized, which made him feel better. Overall, he is feeling positive about the trip and it went better than expected.  Jie says that his brother in [place] moved and the birthday card he sent came back that the address was no longer correct. His other brother Gabriel Rung that had called him last October, reached out to him to just "check in". Auggie wrote him when he got back and arranged to get together yesterday. He was not looking forward to it, but he said it turned out well. Joe asked Nohe his perspective of "where things went wrong in their relationship". Musab says that the interaction was better than he could have imagined.                                                          Garrel Ridgel, PhD Time Spent: 9:40a-10:30a 50 minutes

## 2023-06-11 NOTE — Patient Instructions (Signed)
Continue to do some intermittent icing  Continue with anti-histamine once or twice daily  Suspect the swelling should be going down in 2-3 days.

## 2023-06-11 NOTE — Progress Notes (Signed)
Established Patient Office Visit  Subjective   Patient ID: Randall Torres, male    DOB: 05-Nov-1950  Age: 72 y.o. MRN: [id number]  Chief Complaint  Patient presents with   Insect Bite    Patient complains of insect bite, x1 day, Patient reports swelling in ring finger on right hand     HPI   Randall Torres is here with possible sting or bite right ring finger.  This occurred yesterday.  He was taking out neighbor's trash and felt a sharp stinging sensation.  This was followed by some swelling.  has had some mild itching.  Took some Benadryl.  Has had some swelling today.  No generalized rash.  No shortness of breath or angioedema symptoms.  Never saw any bee.  He also queried whether this may have been a spider.  Past Medical History:  Diagnosis Date   ADD (attention deficit disorder)    ADJUSTMENT DISORDER 09/29/2007   Qualifier: Diagnosis of  By: Fabian Sharp MD, Neta Mends    BPH (benign prostatic hyperplasia)    has seen urologist   Hyperlipidemia    Inguinal hernia    bilateral   Personal history of colonic adenoma 06/30/2008   Personal history of COVID-04 October 2020   Rhinitis 12/31/2010   Past Surgical History:  Procedure Laterality Date   CYSTOSCOPY     INGUINAL HERNIA REPAIR Bilateral 10/18/2014   Procedure: LAPAROSCOPIC BILATERAL INGUINAL HERNIA REPAIR  ;  Surgeon: Claud Kelp, MD;  Location: [facility];  Service: General;  Laterality: Bilateral;   INGUINAL HERNIA REPAIR Right 07/13/2020   Procedure: RIGHT INGUINAL HERNIA REPAIR;  Surgeon: Ovidio Kin, MD;  Location: [facility];  Service: General;  Laterality: Right;   INSERTION OF MESH Bilateral 10/18/2014   Procedure: INSERTION OF MESH;  Surgeon: Claud Kelp, MD;  Location: [facility];  Service: General;  Laterality: Bilateral;   INSERTION OF MESH Right 07/13/2020   Procedure: INSERTION OF MESH;  Surgeon: Ovidio Kin, MD;  Location: [facility];  Service: General;  Laterality: Right;   KNEE  ARTHROSCOPY     TOTAL KNEE ARTHROPLASTY  11/08   medial compartmental    TOTAL KNEE ARTHROPLASTY Right 10/28/2022   Procedure: RIGHT TOTAL KNEE ARTHROPLASTY;  Surgeon: Gean Birchwood, MD;  Location: [facility];  Service: Orthopedics;  Laterality: Right;    reports that he has never smoked. He has never used smokeless tobacco. He reports that he does not drink alcohol and does not use drugs. family history includes Alcohol abuse in an other family member; Breast cancer in his sister; Diabetes in his father and another family member; Healthy in his son; Hyperlipidemia in an other family member; Myasthenia gravis (age of onset: 28) in his brother; Other in his mother and other family members; Stroke in his father; Throat cancer in his sister. No Known Allergies  Review of Systems  Constitutional:  Negative for chills and fever.  Respiratory:  Negative for shortness of breath.       Objective:     BP 124/70 (BP Location: Left Arm, Patient Position: Sitting, Cuff Size: Normal)   Pulse 100   Temp 98 F (36.7 C) (Oral)   Ht 5\' 8"  (1.727 m)   Wt 175 lb 14.4 oz (79.8 kg)   SpO2 98%   BMI 26.75 kg/m  BP Readings from Last 3 Encounters:  06/11/23 124/70  10/28/22 131/69  10/15/22 (!) 141/96   Wt Readings from Last 3 Encounters:  06/11/23 175 lb 14.4 oz (79.8 kg)  04/14/23 175 lb (79.4 kg)  10/28/22 178 lb (80.7 kg)      Physical Exam Vitals reviewed.  Constitutional:      Appearance: Normal appearance.  Cardiovascular:     Rate and Rhythm: Normal rate and regular rhythm.  Pulmonary:     Effort: Pulmonary effort is normal.     Breath sounds: Normal breath sounds. No wheezing or rales.  Skin:    Comments: Right ring finger reveals some diffuse swelling and mild erythema.  Very minimal warmth.  Nontender.  No retained stinger.  He has decent range of motion all joints of the finger and hand.     No evidence for any generalized urticaria.  Neurological:     Mental Status: He is alert.       No results found for any visits on 06/11/23.    The 10-year ASCVD risk score (Arnett DK, et al., 2019) is: 18.6%    Assessment & Plan:   Local allergic reaction right ring finger very likely related to bee sting.  Suspect more likely bee sting than spider bite.  Evidence for local allergic response and no cellulitis at this time.  -Elevate hand frequently -Continue icing 15 to 20 minutes several times daily -Continue at least once daily antihistamine until swelling subsides  Evelena Peat, MD

## 2023-06-19 ENCOUNTER — Other Ambulatory Visit (HOSPITAL_BASED_OUTPATIENT_CLINIC_OR_DEPARTMENT_OTHER): Payer: Self-pay

## 2023-06-19 ENCOUNTER — Other Ambulatory Visit: Payer: Self-pay | Admitting: Internal Medicine

## 2023-06-19 MED ORDER — TAMSULOSIN HCL 0.4 MG PO CAPS
0.4000 mg | ORAL_CAPSULE | Freq: Every day | ORAL | 0 refills | Status: DC
  Filled 2023-06-19: qty 90, 90d supply, fill #0

## 2023-06-19 MED ORDER — DESMOPRESSIN ACETATE 0.2 MG PO TABS: ORAL_TABLET | ORAL | 11 refills | Status: DC

## 2023-06-19 MED FILL — Simvastatin Tab 40 MG: ORAL | 90 days supply | Qty: 90 | Fill #0 | Status: CN

## 2023-06-20 ENCOUNTER — Other Ambulatory Visit (HOSPITAL_BASED_OUTPATIENT_CLINIC_OR_DEPARTMENT_OTHER): Payer: Self-pay

## 2023-07-09 ENCOUNTER — Ambulatory Visit: Payer: Federal, State, Local not specified - PPO | Admitting: Psychology

## 2023-07-10 ENCOUNTER — Other Ambulatory Visit (HOSPITAL_BASED_OUTPATIENT_CLINIC_OR_DEPARTMENT_OTHER): Payer: Self-pay

## 2023-07-10 MED ORDER — COMIRNATY 30 MCG/0.3ML IM SUSY
0.3000 mL | PREFILLED_SYRINGE | Freq: Once | INTRAMUSCULAR | 0 refills | Status: AC
  Filled 2023-07-10: qty 0.3, 1d supply, fill #0

## 2023-07-16 ENCOUNTER — Ambulatory Visit: Payer: Federal, State, Local not specified - PPO | Admitting: Psychology

## 2023-07-16 DIAGNOSIS — F32A Depression, unspecified: Secondary | ICD-10-CM | POA: Diagnosis not present

## 2023-07-16 DIAGNOSIS — F411 Generalized anxiety disorder: Secondary | ICD-10-CM

## 2023-07-16 DIAGNOSIS — F331 Major depressive disorder, recurrent, moderate: Secondary | ICD-10-CM

## 2023-07-16 NOTE — Progress Notes (Signed)
[facility] Counselor Initial Adult Exam  Name: Randall Torres Date: 07/16/2023 MRN: [id number] DOB: March 18, 1951 PCP: Madelin Headings, MD      Guardian/Payee:  N/A    Paperwork requested: No   Reason for Visit /Presenting Problem: Seeking to reduce symptoms of anxiety and depression  Mental Status Exam: Appearance:   Casual     Behavior:  Appropriate  Motor:  Normal  Speech/Language:   Normal Rate  Affect:  Appropriate  Mood:  normal  Thought process:  normal  Thought content:    WNL  Sensory/Perceptual disturbances:    WNL  Orientation:  oriented to person, place, and situation  Attention:  Good  Concentration:  Good  Memory:  WNL  Fund of knowledge:   Good  Insight:    Good  Judgment:   Good  Impulse Control:  Good    Reported Symptoms:  sadness, depleted self esteem, lack of motivation, agitation/worry (anxiety),  Risk Assessment: Danger to Self:  No Self-injurious Behavior: No Danger to Others: No Duty to Warn:no Physical Aggression / Violence:No  Access to Firearms a concern: No  Gang Involvement:No  Patient / guardian was educated about steps to take if suicide or homicide risk level increases between visits: yes While future psychiatric events cannot be accurately predicted, the patient does not currently require acute inpatient psychiatric care and does not currently meet [facility] involuntary commitment criteria.  Substance Abuse History: Current substance abuse: Yes     Past Psychiatric History:   Previous psychological history is significant for depression and alcohol abuse Outpatient Providers:unknown History of Psych Hospitalization: No  Psychological Testing:  unknown    Abuse History:  Victim of: No.,  N/A    Report needed: No. Victim of  Neglect:No. Perpetrator of  N/A   Witness / Exposure to Domestic Violence: No   Protective Services Involvement: No  Witness to [employer] Violence:  No   Family History:  Family History  Problem Relation Age of Onset   Diabetes Other    Other Other        CVA age 20   Other Other        sleep problems   Myasthenia gravis Brother 32       deceased   Hyperlipidemia Other    Alcohol abuse Other        Uncle   Other Mother    Stroke Father    Diabetes Father    Throat cancer Sister    Breast cancer Sister    Healthy Son    Neuropathy Neg Hx     Living situation: the patient lives with their spouse  Sexual Orientation: Straight  Relationship Status: married  Name of spouse / other:unknown If a parent, number of children / ages:2 boys, 20 and 42  Support Systems: spouse  Surveyor, quantity Stress:  No   Income/Employment/Disability: Neurosurgeon:  unknown  Educational History: Education: post Engineer, maintenance (IT) work or degree  Religion/Sprituality/World View:  Catholic  Any cultural differences that may affect / interfere with treatment:  N/A  Recreation/Hobbies: exercise walking  Stressors: Other: retirement    Strengths: Supportive Relationships, Family, and Church  Barriers:  unknown   Legal History: Pending legal issue / charges: The patient has no significant history of legal issues. History of legal issue / charges:  N/A  Medical History/Surgical History: reviewed Past Medical History:  Diagnosis Date   ADD (attention deficit disorder)    ADJUSTMENT DISORDER 09/29/2007   Qualifier: Diagnosis of  By: Fabian Sharp MD, Neta Mends    BPH (benign prostatic hyperplasia)    has seen urologist   Hyperlipidemia    Inguinal hernia    bilateral   Personal history of colonic adenoma 06/30/2008   Personal history of COVID-04 October 2020   Rhinitis 12/31/2010    Past Surgical History:  Procedure Laterality Date   CYSTOSCOPY      INGUINAL HERNIA REPAIR Bilateral 10/18/2014   Procedure: LAPAROSCOPIC BILATERAL INGUINAL HERNIA REPAIR  ;  Surgeon: Claud Kelp, MD;  Location: [facility];  Service: General;  Laterality: Bilateral;   INGUINAL HERNIA REPAIR Right 07/13/2020   Procedure: RIGHT INGUINAL HERNIA REPAIR;  Surgeon: Ovidio Kin, MD;  Location: [facility];  Service: General;  Laterality: Right;   INSERTION OF MESH Bilateral 10/18/2014   Procedure: INSERTION OF MESH;  Surgeon: Claud Kelp, MD;  Location: [facility];  Service: General;  Laterality: Bilateral;   INSERTION OF MESH Right 07/13/2020   Procedure: INSERTION OF MESH;  Surgeon: Ovidio Kin, MD;  Location: [facility];  Service: General;  Laterality: Right;   KNEE ARTHROSCOPY     TOTAL KNEE ARTHROPLASTY  11/08   medial compartmental    TOTAL KNEE ARTHROPLASTY Right 10/28/2022   Procedure: RIGHT TOTAL KNEE ARTHROPLASTY;  Surgeon: Gean Birchwood, MD;  Location: [facility];  Service: Orthopedics;  Laterality: Right;    Medications: Current Outpatient Medications  Medication Sig Dispense Refill   benzonatate (TESSALON) 200 MG capsule Take 1 capsule (200 mg total) by mouth every 6 (six) hours as needed for cough. 60 capsule 0   desmopressin (DDAVP) 0.2 MG tablet Take 400 mcg by mouth at bedtime.     desmopressin (DDAVP) 0.2 MG tablet Take 2 tablets by mouth 2 hours before bedtime 60 tablet 11   simvastatin (ZOCOR) 40 MG tablet Take 1 tablet (40 mg total) by mouth daily at 6pm. 90 tablet 3   solifenacin (VESICARE) 10 MG tablet Take 10 mg by mouth daily.     tamsulosin (FLOMAX) 0.4 MG CAPS capsule Take 1 capsule (0.4 mg total) by mouth daily. 90 capsule 0   tizanidine (ZANAFLEX) 2 MG capsule Take 2 capsules (4 mg total) by mouth 3 (three) times daily as needed for muscle spasms. 60 capsule 0   No current facility-administered medications for this visit.    No Known Allergies        Goals: Engage in outpatient psychotherapy to manage anxiety  and reduce symptoms of depression (sadness, depleted esteem, low motivation). Will continue evaluation (goal date 9-31) and reduce reported symptoms (goal date 12-24). Diagnoses:  Depression, Gen Anxiety  Plan of Care: Outpatient Psychotherapy Patient was seen in the provider's office for session. Session Notes: He says that he had been thinking that he was much better and he might consider stopping treatment. He says that he has had a number of issues arise that suggests he still has issues to address. He had a claustrophobic  response at a theatre and none of his "tools" worked to take him out of his distress. His wife got very angry at him because he was not communicative with her and she was not empathetic at all. He says wife comes from a family with very little empathy. She told him she will get someone else to go to the theatre with her in the future. Discussed his obsessive tendencies, which his wife finds annoying. He despairs that his wife has no compassion for him and his "issues". His relationship with his wife is not satisfying or supportive. He misses affection and support. He says he has tried to talk to her, but she shuts down any conversation about their physical relationship.  Talked about asserting himself with wife even if he feels it will not do any good. He often retreats and does not share his experiences.                                                            Garrel Ridgel, PhD Time Spent: 7:40a-8:30a 50 minutes

## 2023-07-20 ENCOUNTER — Encounter: Payer: Self-pay | Admitting: Internal Medicine

## 2023-07-20 DIAGNOSIS — L609 Nail disorder, unspecified: Secondary | ICD-10-CM

## 2023-07-22 NOTE — Telephone Encounter (Signed)
Go ahead and refer to podiatry  for toe nail abnormality .

## 2023-07-30 ENCOUNTER — Ambulatory Visit (INDEPENDENT_AMBULATORY_CARE_PROVIDER_SITE_OTHER): Payer: Medicare Other | Admitting: Psychology

## 2023-07-30 DIAGNOSIS — F32A Depression, unspecified: Secondary | ICD-10-CM

## 2023-07-30 DIAGNOSIS — F411 Generalized anxiety disorder: Secondary | ICD-10-CM

## 2023-07-30 DIAGNOSIS — F331 Major depressive disorder, recurrent, moderate: Secondary | ICD-10-CM

## 2023-07-30 NOTE — Progress Notes (Signed)
[facility] Counselor Initial Adult Exam  Name: Randall Torres Date: 07/30/2023 MRN: [id number] DOB: [birth year] PCP: Madelin Headings, MD      Guardian/Payee:  N/A    Paperwork requested: No   Reason for Visit /Presenting Problem: Seeking to reduce symptoms of anxiety and depression  Mental Status Exam: Appearance:   Casual     Behavior:  Appropriate  Motor:  Normal  Speech/Language:   Normal Rate  Affect:  Appropriate  Mood:  normal  Thought process:  normal  Thought content:    WNL  Sensory/Perceptual disturbances:    WNL  Orientation:  oriented to person, place, and situation  Attention:  Good  Concentration:  Good  Memory:  WNL  Fund of knowledge:   Good  Insight:    Good  Judgment:   Good  Impulse Control:  Good    Reported Symptoms:  sadness, depleted self esteem, lack of motivation, agitation/worry (anxiety),  Risk Assessment: Danger to Self:  No Self-injurious Behavior: No Danger to Others: No Duty to Warn:no Physical Aggression / Violence:No  Access to Firearms a concern: No  Gang Involvement:No  Patient / guardian was educated about steps to take if suicide or homicide risk level increases between visits: yes While future psychiatric events cannot be accurately predicted, the patient does not currently require acute inpatient psychiatric care and does not currently meet [facility] involuntary commitment criteria.  Substance Abuse History: Current substance abuse: Yes     Past Psychiatric History:   Previous psychological history is significant for depression and alcohol abuse Outpatient Providers:unknown History of Psych Hospitalization: No  Psychological Testing:  unknown    Abuse History:  Victim of: No.,  N/A    Report  needed: No. Victim of Neglect:No. Perpetrator of  N/A   Witness / Exposure to Domestic Violence: No   Protective Services Involvement: No  Witness to [employer] Violence:  No   Family History:  Family History  Problem Relation Age of Onset   Diabetes Other    Other Other        CVA age 74   Other Other        sleep problems   Myasthenia gravis Brother 74       deceased   Hyperlipidemia Other    Alcohol abuse Other        Uncle   Other Mother    Stroke Father    Diabetes Father    Throat cancer Sister    Breast cancer Sister    Healthy Son    Neuropathy Neg Hx     Living situation: the patient lives with their spouse  Sexual Orientation: Straight  Relationship Status: married  Name of spouse / other:unknown If a parent, number of children / ages:2 boys, 87 and 42  Support Systems: spouse  Surveyor, quantity Stress:  No   Income/Employment/Disability: Neurosurgeon:  unknown  Educational History: Education: post Engineer, maintenance (IT) work or degree  Religion/Sprituality/World View: Catholic  Any cultural differences that may affect / interfere with treatment:  N/A  Recreation/Hobbies: exercise walking  Stressors: Other: retirement    Strengths: Supportive Relationships, Family, and Church  Barriers:  unknown   Legal History: Pending legal issue / charges: The patient has no significant history of legal issues. History of legal issue / charges:  N/A  Medical History/Surgical History: reviewed Past Medical History:  Diagnosis Date   ADD (attention deficit disorder)    ADJUSTMENT DISORDER 09/29/2007   Qualifier: Diagnosis of  By: Fabian Sharp MD, Neta Mends    BPH (benign prostatic hyperplasia)    has seen urologist   Hyperlipidemia    Inguinal hernia    bilateral   Personal history of colonic adenoma 06/30/2008   Personal history of COVID-04 October 2020   Rhinitis 12/31/2010    Past Surgical History:  Procedure Laterality Date    CYSTOSCOPY     INGUINAL HERNIA REPAIR Bilateral 10/18/2014   Procedure: LAPAROSCOPIC BILATERAL INGUINAL HERNIA REPAIR  ;  Surgeon: Claud Kelp, MD;  Location: [facility];  Service: General;  Laterality: Bilateral;   INGUINAL HERNIA REPAIR Right 07/13/2020   Procedure: RIGHT INGUINAL HERNIA REPAIR;  Surgeon: Ovidio Kin, MD;  [facility];  Service: General;  Laterality: Right;   INSERTION OF MESH Bilateral 10/18/2014   Procedure: INSERTION OF MESH;  Surgeon: Claud Kelp, MD;  [facility]: [facility];  Service: General;  Laterality: Bilateral;   INSERTION OF MESH Right 07/13/2020   Procedure: INSERTION OF MESH;  Surgeon: Ovidio Kin, MD;  [facility];  Service: General;  Laterality: Right;   KNEE ARTHROSCOPY     TOTAL KNEE ARTHROPLASTY  11/08   medial compartmental    TOTAL KNEE ARTHROPLASTY Right 10/28/2022   Procedure: RIGHT TOTAL KNEE ARTHROPLASTY;  Surgeon: Gean Birchwood, MD;  [facility]: [facility];  Service: Orthopedics;  Laterality: Right;    Medications: Current Outpatient Medications  Medication Sig Dispense Refill   benzonatate (TESSALON) 200 MG capsule Take 1 capsule (200 mg total) by mouth every 6 (six) hours as needed for cough. 60 capsule 0   desmopressin (DDAVP) 0.2 MG tablet Take 400 mcg by mouth at bedtime.     desmopressin (DDAVP) 0.2 MG tablet Take 2 tablets by mouth 2 hours before bedtime 60 tablet 11   simvastatin (ZOCOR) 40 MG tablet Take 1 tablet (40 mg total) by mouth daily at 6pm. 90 tablet 3   solifenacin (VESICARE) 10 MG tablet Take 10 mg by mouth daily.     tamsulosin (FLOMAX) 0.4 MG CAPS capsule Take 1 capsule (0.4 mg total) by mouth daily. 90 capsule 0   tizanidine (ZANAFLEX) 2 MG capsule Take 2 capsules (4 mg total) by mouth 3 (three) times daily as needed for muscle spasms. 60 capsule 0   No current facility-administered medications for this visit.    No Known Allergies        Goals: Engage in outpatient psychotherapy  to manage anxiety and reduce symptoms of depression (sadness, depleted esteem, low motivation). Will continue evaluation (goal date 9-31) and reduce reported symptoms (goal date 12-24). Diagnoses:  Depression, Gen Anxiety  Plan of Care: Outpatient Psychotherapy Patient was seen in the provider's office for session. Session Notes: Wafi says that he has been thinking about my comments about his marriage at last session. He decided to share with his wife at one point last  week that he was having a struggle when they were out socially. She told him "you are fine" and he felt dismissed by her. He chose not to let her know how he felt about her response. He feels this is "just the state of his marriage". He doesn't feel that his wife is capable or desires to change. He says that leaving his relationship is not an option. He feels that he has contributed to the problems in their relationship. We discussed having reasonable expectations to avoid disappointment.                                                               Garrel Ridgel, PhD Time Spent: 9:40a-10:30a 50 minutes

## 2023-07-31 ENCOUNTER — Ambulatory Visit (INDEPENDENT_AMBULATORY_CARE_PROVIDER_SITE_OTHER): Payer: Medicare Other

## 2023-07-31 ENCOUNTER — Encounter: Payer: Self-pay | Admitting: Podiatry

## 2023-07-31 ENCOUNTER — Ambulatory Visit: Payer: Medicare Other | Admitting: Podiatry

## 2023-07-31 ENCOUNTER — Other Ambulatory Visit: Payer: Self-pay | Admitting: Internal Medicine

## 2023-07-31 DIAGNOSIS — M779 Enthesopathy, unspecified: Secondary | ICD-10-CM | POA: Diagnosis not present

## 2023-07-31 DIAGNOSIS — L600 Ingrowing nail: Secondary | ICD-10-CM | POA: Diagnosis not present

## 2023-07-31 DIAGNOSIS — M205X1 Other deformities of toe(s) (acquired), right foot: Secondary | ICD-10-CM

## 2023-08-01 ENCOUNTER — Other Ambulatory Visit (HOSPITAL_BASED_OUTPATIENT_CLINIC_OR_DEPARTMENT_OTHER): Payer: Self-pay

## 2023-08-01 ENCOUNTER — Other Ambulatory Visit (HOSPITAL_COMMUNITY): Payer: Self-pay

## 2023-08-01 MED FILL — Tamsulosin HCl Cap 0.4 MG: ORAL | 90 days supply | Qty: 90 | Fill #0 | Status: AC

## 2023-08-01 MED FILL — Tamsulosin HCl Cap 0.4 MG: ORAL | 90 days supply | Qty: 90 | Fill #0 | Status: CN

## 2023-08-01 NOTE — Progress Notes (Signed)
Subjective:   Patient ID: Randall Torres, male   DOB: 72 y.o.   MRN: [id number]   HPI Patient presents with nail disease that are very thick on the big toenails of both feet hard to cut and has structural deformity right first metatarsal with severe arthritis of the joint.  Patient does not smoke likes to be active   Review of Systems  All other systems reviewed and are negative.       Objective:  Physical Exam Vitals and nursing note reviewed.  Constitutional:      Appearance: He is well-developed.  Pulmonary:     Effort: Pulmonary effort is normal.  Musculoskeletal:        General: Normal range of motion.  Skin:    General: Skin is warm.  Neurological:     Mental Status: He is alert.     Neurovascular status found to be intact muscle strength was found to be adequate range of motion adequate with severe loss of motion first MPJ right with large spur formation around the joint surface with nails that are severely thickened and dystrophic.  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  He is going to think about this and I did review with him the recovery from nail surgery and that I do not currently recommend bone surgery but may be necessary someday  X-rays indicate that there is severe spurring around the first MPJ bilateral with narrowing of the joint surface

## 2023-08-04 ENCOUNTER — Encounter: Payer: Self-pay | Admitting: Podiatry

## 2023-08-04 ENCOUNTER — Ambulatory Visit (INDEPENDENT_AMBULATORY_CARE_PROVIDER_SITE_OTHER): Payer: Medicare Other | Admitting: Podiatry

## 2023-08-04 DIAGNOSIS — L600 Ingrowing nail: Secondary | ICD-10-CM | POA: Diagnosis not present

## 2023-08-04 NOTE — Patient Instructions (Addendum)

## 2023-08-04 NOTE — Progress Notes (Signed)
Subjective:   Patient ID: Randall Torres, male   DOB: 72 y.o.   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

## 2023-08-18 NOTE — Progress Notes (Unsigned)
No chief complaint on file.   HPI: Patient  Randall Torres  72 y.o. comes in today for Preventive Health Care visit   Has seen podiatry and counseling on going  Stable   Health Maintenance  Topic Date Due   Medicare Annual Wellness (AWV)  Never done   INFLUENZA VACCINE  04/17/2023   COVID-19 Vaccine (8 - 2023-24 season) 09/04/2023   DTaP/Tdap/Td (3 - Td or Tdap) 12/05/2024   Colonoscopy  08/03/2028   Pneumonia Vaccine 62+ Years old  Completed   Hepatitis C Screening  Completed   Zoster Vaccines- Shingrix  Completed   HPV VACCINES  Aged Out   Health Maintenance Review LIFESTYLE:  Exercise:   Tobacco/ETS: Alcohol:  Sugar beverages: Sleep: Drug use: no HH of  Work:    ROS:  GEN/ HEENT: No fever, significant weight changes sweats headaches vision problems hearing changes, CV/ PULM; No chest pain shortness of breath cough, syncope,edema  change in exercise tolerance. GI /GU: No adominal pain, vomiting, change in bowel habits. No blood in the stool. No significant GU symptoms. SKIN/HEME: ,no acute skin rashes suspicious lesions or bleeding. No lymphadenopathy, nodules, masses.  NEURO/ PSYCH:  No neurologic signs such as weakness numbness. No depression anxiety. IMM/ Allergy: No unusual infections.  Allergy .   REST of 12 system review negative except as per HPI   Past Medical History:  Diagnosis Date   ADD (attention deficit disorder)    ADJUSTMENT DISORDER 09/29/2007   Qualifier: Diagnosis of  By: Fabian Sharp MD, Neta Mends    BPH (benign prostatic hyperplasia)    has seen urologist   Hyperlipidemia    Inguinal hernia    bilateral   Personal history of colonic adenoma 06/30/2008   Personal history of COVID-04 October 2020   Rhinitis 12/31/2010    Past Surgical History:  Procedure Laterality Date   CYSTOSCOPY     INGUINAL HERNIA REPAIR Bilateral 10/18/2014   Procedure: LAPAROSCOPIC BILATERAL INGUINAL HERNIA REPAIR  ;  Surgeon: Claud Kelp, MD;  [facility]: [facility];  Service: General;  Laterality: Bilateral;   INGUINAL HERNIA REPAIR Right 07/13/2020   Procedure: RIGHT INGUINAL HERNIA REPAIR;  Surgeon: Ovidio Kin, MD;  Location: [facility];  Service: General;  Laterality: Right;   INSERTION OF MESH Bilateral 10/18/2014   Procedure: INSERTION OF MESH;  Surgeon: Claud Kelp, MD;  [facility];  Service: General;  Laterality: Bilateral;   INSERTION OF MESH Right 07/13/2020   Procedure: INSERTION OF MESH;  Surgeon: Ovidio Kin, MD;  Location: [facility];  Service: General;  Laterality: Right;   KNEE ARTHROSCOPY     TOTAL KNEE ARTHROPLASTY  11/08   medial compartmental    TOTAL KNEE ARTHROPLASTY Right 10/28/2022   Procedure: RIGHT TOTAL KNEE ARTHROPLASTY;  Surgeon: Gean Birchwood, MD;  [facility];  Service: Orthopedics;  Laterality: Right;    Family History  Problem Relation Age of Onset   Diabetes Other    Other Other        CVA age 68   Other Other        sleep problems   Myasthenia gravis Brother 32       deceased   Hyperlipidemia Other    Alcohol abuse Other        Uncle   Other Mother    Stroke Father    Diabetes Father    Throat cancer Sister    Breast cancer Sister  Healthy Son    Neuropathy Neg Hx     Social History   Socioeconomic History   Marital status: Married    Spouse name: Not on file   Number of children: Not on file   Years of education: Not on file   Highest education level: Not on file  Occupational History   Not on file  Tobacco Use   Smoking status: Never   Smokeless tobacco: Never  Vaping Use   Vaping status: Never Used  Substance and Sexual Activity   Alcohol use: No    Alcohol/week: 0.0 standard drinks of alcohol    Comment: Hx of alcohol abuse - none since 2010   Drug use: No   Sexual activity: Not on file  Other Topics Concern   Not on file  Social History Narrative   Occupation: Publishing rights manager. retired 2021   Highest level of education: law  school   Married   Exercise: Swims daily  5-6 days per week.    Goes to AA  5 day per week or so.      HH of 2 dog     Wife x for breast cancer    2 children.   Lives with wife in a 2 story home.    Social Determinants of Health   Financial Resource Strain: Not on file  Food Insecurity: Not on file  Transportation Needs: Not on file  Physical Activity: Not on file  Stress: Not on file  Social Connections: Not on file    Outpatient Medications Prior to Visit  Medication Sig Dispense Refill   benzonatate (TESSALON) 200 MG capsule Take 1 capsule (200 mg total) by mouth every 6 (six) hours as needed for cough. 60 capsule 0   desmopressin (DDAVP) 0.2 MG tablet Take 400 mcg by mouth at bedtime.     desmopressin (DDAVP) 0.2 MG tablet Take 2 tablets by mouth 2 hours before bedtime 60 tablet 11   simvastatin (ZOCOR) 40 MG tablet Take 1 tablet (40 mg total) by mouth daily at 6pm. 90 tablet 3   solifenacin (VESICARE) 10 MG tablet Take 10 mg by mouth daily.     tamsulosin (FLOMAX) 0.4 MG CAPS capsule Take 1 capsule (0.4 mg total) by mouth daily. 90 capsule 0   tizanidine (ZANAFLEX) 2 MG capsule Take 2 capsules (4 mg total) by mouth 3 (three) times daily as needed for muscle spasms. 60 capsule 0   No facility-administered medications prior to visit.     EXAM:  There were no vitals taken for this visit.  There is no height or weight on file to calculate BMI. Wt Readings from Last 3 Encounters:  06/11/23 175 lb 14.4 oz (79.8 kg)  04/14/23 175 lb (79.4 kg)  10/28/22 178 lb (80.7 kg)    Physical Exam: Vital signs reviewed BMW:UXLK is a well-developed well-nourished alert cooperative    who appearsr stated age in no acute distress.  HEENT: normocephalic atraumatic , Eyes: PERRL EOM's full, conjunctiva clear, Nares: paten,t no deformity discharge or tenderness., Ears: no deformity EAC's clear TMs with normal landmarks. Mouth: clear OP, no lesions, edema.  Moist mucous membranes.  Dentition in adequate repair. NECK: supple without masses, thyromegaly or bruits. CHEST/PULM:  Clear to auscultation and percussion breath sounds equal no wheeze , rales or rhonchi. No chest wall deformities or tenderness.  CV: PMI is nondisplaced, S1 S2 no gallops, murmurs, rubs. Peripheral pulses are full without delay.No JVD .  ABDOMEN: Bowel sounds normal nontender  No guard or rebound, no hepato splenomegal no CVA tenderness.  No hernia. Extremtities:  No clubbing cyanosis or edema, no acute joint swelling or redness no focal atrophy NEURO:  Oriented x3, cranial nerves 3-12 appear to be intact, no obvious focal weakness,gait within normal limits no abnormal reflexes or asymmetrical SKIN: No acute rashes normal turgor, color, no bruising or petechiae. PSYCH: Oriented, good eye contact, no obvious depression anxiety, cognition and judgment appear normal. LN: no cervical axillary adenopathy  Lab Results  Component Value Date   WBC 7.8 10/15/2022   HGB 14.3 10/15/2022   HCT 43.2 10/15/2022   PLT 261 10/15/2022   GLUCOSE 96 08/13/2022   CHOL 173 08/13/2022   TRIG 52.0 08/13/2022   HDL 59.10 08/13/2022   LDLDIRECT 147.2 11/29/2009   LDLCALC 103 (H) 08/13/2022   ALT 25 08/13/2022   AST 33 08/13/2022   NA 140 08/13/2022   K 4.5 08/13/2022   CL 102 08/13/2022   CREATININE 0.77 08/13/2022   BUN 19 08/13/2022   CO2 29 08/13/2022   TSH 1.37 08/07/2021   PSA 0.72 08/13/2022   INR 1.0 03/12/2021   HGBA1C 6.1 08/07/2021    BP Readings from Last 3 Encounters:  06/11/23 124/70  10/28/22 131/69  10/15/22 (!) 141/96    Lab plan  reviewed with patient   ASSESSMENT AND PLAN:  Discussed the following assessment and plan:    ICD-10-CM   1. Visit for preventive health examination  Z00.00     2. Medication management  Z79.899     3. History of colonic polyps  Z86.0100     4. B12 deficiency  E53.8      No follow-ups on file.  Patient Care Team: Sherica Paternostro, Neta Mends, MD as PCP -  General Clarene Duke (Psychiatry) Glendale Chard, DO as Consulting Physician (Neurology) There are no Patient Instructions on file for this visit.  Neta Mends. Jenasia Dolinar M.D.

## 2023-08-19 ENCOUNTER — Ambulatory Visit (INDEPENDENT_AMBULATORY_CARE_PROVIDER_SITE_OTHER): Payer: Medicare Other | Admitting: Internal Medicine

## 2023-08-19 ENCOUNTER — Encounter: Payer: Self-pay | Admitting: Internal Medicine

## 2023-08-19 ENCOUNTER — Other Ambulatory Visit (HOSPITAL_BASED_OUTPATIENT_CLINIC_OR_DEPARTMENT_OTHER): Payer: Self-pay

## 2023-08-19 VITALS — BP 148/84 | HR 76 | Temp 97.6°F | Ht 68.0 in | Wt 178.2 lb

## 2023-08-19 DIAGNOSIS — N401 Enlarged prostate with lower urinary tract symptoms: Secondary | ICD-10-CM | POA: Diagnosis not present

## 2023-08-19 DIAGNOSIS — E538 Deficiency of other specified B group vitamins: Secondary | ICD-10-CM

## 2023-08-19 DIAGNOSIS — Z974 Presence of external hearing-aid: Secondary | ICD-10-CM | POA: Diagnosis not present

## 2023-08-19 DIAGNOSIS — Z79899 Other long term (current) drug therapy: Secondary | ICD-10-CM

## 2023-08-19 DIAGNOSIS — Z860100 Personal history of colon polyps, unspecified: Secondary | ICD-10-CM | POA: Diagnosis not present

## 2023-08-19 DIAGNOSIS — R030 Elevated blood-pressure reading, without diagnosis of hypertension: Secondary | ICD-10-CM

## 2023-08-19 DIAGNOSIS — Z0000 Encounter for general adult medical examination without abnormal findings: Secondary | ICD-10-CM | POA: Diagnosis not present

## 2023-08-19 DIAGNOSIS — E785 Hyperlipidemia, unspecified: Secondary | ICD-10-CM

## 2023-08-19 LAB — PSA: PSA: 1.18 ng/mL (ref 0.10–4.00)

## 2023-08-19 LAB — COMPREHENSIVE METABOLIC PANEL
ALT: 21 U/L (ref 0–53)
AST: 28 U/L (ref 0–37)
Albumin: 4 g/dL (ref 3.5–5.2)
Alkaline Phosphatase: 62 U/L (ref 39–117)
BUN: 18 mg/dL (ref 6–23)
CO2: 31 meq/L (ref 19–32)
Calcium: 9.2 mg/dL (ref 8.4–10.5)
Chloride: 99 meq/L (ref 96–112)
Creatinine, Ser: 0.73 mg/dL (ref 0.40–1.50)
GFR: 89.91 mL/min (ref >60.00)
Glucose, Bld: 89 mg/dL (ref 70–99)
Potassium: 4.1 meq/L (ref 3.5–5.1)
Sodium: 135 meq/L (ref 135–145)
Total Bilirubin: 0.6 mg/dL (ref 0.2–1.2)
Total Protein: 7.2 g/dL (ref 6.0–8.3)

## 2023-08-19 LAB — VITAMIN B12: Vitamin B-12: 427 pg/mL (ref 211–911)

## 2023-08-19 LAB — CBC WITH DIFFERENTIAL/PLATELET
Basophils Absolute: 0.1 10*3/uL (ref 0.0–0.1)
Basophils Relative: 0.8 % (ref 0.0–3.0)
Eosinophils Absolute: 0.3 10*3/uL (ref 0.0–0.7)
Eosinophils Relative: 4.2 % (ref 0.0–5.0)
HCT: 40.6 % (ref 39.0–52.0)
Hemoglobin: 13.4 g/dL (ref 13.0–17.0)
Lymphocytes Relative: 23.9 % (ref 12.0–46.0)
Lymphs Abs: 1.8 10*3/uL (ref 0.7–4.0)
MCHC: 32.9 g/dL (ref 30.0–36.0)
MCV: 90.5 fL (ref 78.0–100.0)
Monocytes Absolute: 0.5 10*3/uL (ref 0.1–1.0)
Monocytes Relative: 6.3 % (ref 3.0–12.0)
Neutro Abs: 4.8 10*3/uL (ref 1.4–7.7)
Neutrophils Relative %: 64.8 % (ref 43.0–77.0)
Platelets: 270 10*3/uL (ref 150.0–400.0)
RBC: 4.49 Mil/uL (ref 4.22–5.81)
RDW: 13.4 % (ref 11.5–15.5)
WBC: 7.4 10*3/uL (ref 4.0–10.5)

## 2023-08-19 LAB — TSH: TSH: 1.54 u[IU]/mL (ref 0.35–5.50)

## 2023-08-19 LAB — LIPID PANEL
Cholesterol: 162 mg/dL (ref 0–200)
HDL: 54.3 mg/dL (ref >39.00)
LDL Cholesterol: 96 mg/dL (ref 0–99)
NonHDL: 108.17
Total CHOL/HDL Ratio: 3
Triglycerides: 59 mg/dL (ref 0.0–149.0)
VLDL: 11.8 mg/dL (ref 0.0–40.0)

## 2023-08-19 LAB — HEMOGLOBIN A1C: Hgb A1c MFr Bld: 6.1 % (ref 4.6–6.5)

## 2023-08-19 MED ORDER — TAMSULOSIN HCL 0.4 MG PO CAPS
0.4000 mg | ORAL_CAPSULE | Freq: Every day | ORAL | 3 refills | Status: DC
  Filled 2023-08-19 – 2023-11-13 (×2): qty 90, 90d supply, fill #0
  Filled 2024-02-17: qty 90, 90d supply, fill #1
  Filled 2024-05-13 (×2): qty 90, 90d supply, fill #2

## 2023-08-19 MED ORDER — SIMVASTATIN 40 MG PO TABS
40.0000 mg | ORAL_TABLET | Freq: Every day | ORAL | 3 refills | Status: DC
  Filled 2023-08-19 – 2023-09-01 (×3): qty 90, 90d supply, fill #0
  Filled 2023-12-29: qty 90, 90d supply, fill #1
  Filled 2024-03-15: qty 90, 90d supply, fill #2
  Filled 2024-06-22: qty 90, 90d supply, fill #3

## 2023-08-19 NOTE — Patient Instructions (Addendum)
Good to see you today. Update labs today .  BP goal average  130/80 range  but  at least below 140/90 Take blood pressure readings twice a day for 5-7  days and record .     Take 2 -3 readings at each sitting .  Can send in readings  by My Chart.     Before checking your blood pressure make sure: You are seated and quite for 5 min before checking Feet are flat on the floor Siting in chair with your back supported straight up and down Arm resting on table or arm of chair at heart level Bladder is empty You have NOT had caffeine or tobacco within the last 30 min  [email or web address]

## 2023-08-20 ENCOUNTER — Other Ambulatory Visit (HOSPITAL_BASED_OUTPATIENT_CLINIC_OR_DEPARTMENT_OTHER): Payer: Self-pay

## 2023-08-20 ENCOUNTER — Ambulatory Visit: Payer: Medicare Other | Admitting: Psychology

## 2023-08-20 DIAGNOSIS — F32A Depression, unspecified: Secondary | ICD-10-CM

## 2023-08-20 DIAGNOSIS — F331 Major depressive disorder, recurrent, moderate: Secondary | ICD-10-CM

## 2023-08-20 DIAGNOSIS — F411 Generalized anxiety disorder: Secondary | ICD-10-CM | POA: Diagnosis not present

## 2023-08-20 NOTE — Progress Notes (Unsigned)
[facility] Counselor Initial Adult Exam  Name: Randall Torres Date: 08/20/2023 MRN: [id number] DOB: [birth year] PCP: Madelin Headings, MD      Guardian/Payee:  N/A    Paperwork requested: No   Reason for Visit /Presenting Problem: Seeking to reduce symptoms of anxiety and depression  Mental Status Exam: Appearance:   Casual     Behavior:  Appropriate  Motor:  Normal  Speech/Language:   Normal Rate  Affect:  Appropriate  Mood:  normal  Thought process:  normal  Thought content:    WNL  Sensory/Perceptual disturbances:    WNL  Orientation:  oriented to person, place, and situation  Attention:  Good  Concentration:  Good  Memory:  WNL  Fund of knowledge:   Good  Insight:    Good  Judgment:   Good  Impulse Control:  Good    Reported Symptoms:  sadness, depleted self esteem, lack of motivation, agitation/worry (anxiety),  Risk Assessment: Danger to Self:  No Self-injurious Behavior: No Danger to Others: No Duty to Warn:no Physical Aggression / Violence:No  Access to Firearms a concern: No  Gang Involvement:No  Patient / guardian was educated about steps to take if suicide or homicide risk level increases between visits: yes While future psychiatric events cannot be accurately predicted, the patient does not currently require acute inpatient psychiatric care and does not currently meet [facility] involuntary commitment criteria.  Substance Abuse History: Current substance abuse: Yes     Past Psychiatric History:   Previous psychological history is significant for depression and alcohol abuse Outpatient Providers:unknown History of Psych Hospitalization: No  Psychological Testing:  unknown    Abuse History:  Victim of: No.,  N/A    Report  needed: No. Victim of Neglect:No. Perpetrator of  N/A   Witness / Exposure to Domestic Violence: No   Protective Services Involvement: No  Witness to [employer] Violence:  No   Family History:  Family History  Problem Relation Age of Onset   Diabetes Other    Other Other        CVA age 45   Other Other        sleep problems   Myasthenia gravis Brother 74       deceased   Hyperlipidemia Other    Alcohol abuse Other        Uncle   Other Mother    Stroke Father    Diabetes Father    Throat cancer Sister    Breast cancer Sister    Healthy Son    Neuropathy Neg Hx     Living situation: the patient lives with their spouse  Sexual Orientation: Straight  Relationship Status: married  Name of spouse / other:unknown If a parent, number of children / ages:2 boys, 17 and 42  Support Systems: spouse  Surveyor, quantity Stress:  No   Income/Employment/Disability: Neurosurgeon:  unknown  Educational History: Education: post Engineer, maintenance (IT) work or degree  Religion/Sprituality/World View: Catholic  Any cultural differences that may affect / interfere with treatment:  N/A  Recreation/Hobbies: exercise walking  Stressors: Other: retirement    Strengths: Supportive Relationships, Family, and Church  Barriers:  unknown   Legal History: Pending legal issue / charges: The patient has no significant history of legal issues. History of legal issue / charges:  N/A  Medical History/Surgical History: reviewed Past Medical History:  Diagnosis Date   ADD (attention deficit disorder)    ADJUSTMENT DISORDER 09/29/2007   Qualifier: Diagnosis of  By: Fabian Sharp MD, Neta Mends    BPH (benign prostatic hyperplasia)    has seen urologist   Hyperlipidemia    Inguinal hernia    bilateral   Personal history of colonic adenoma 06/30/2008   Personal history of COVID-04 October 2020   Rhinitis 12/31/2010    Past Surgical History:  Procedure Laterality Date    CYSTOSCOPY     INGUINAL HERNIA REPAIR Bilateral 10/18/2014   Procedure: LAPAROSCOPIC BILATERAL INGUINAL HERNIA REPAIR  ;  Surgeon: Claud Kelp, MD;  Location: [facility];  Service: General;  Laterality: Bilateral;   INGUINAL HERNIA REPAIR Right 07/13/2020   Procedure: RIGHT INGUINAL HERNIA REPAIR;  Surgeon: Ovidio Kin, MD;  Location: [facility];  Service: General;  Laterality: Right;   INSERTION OF MESH Bilateral 10/18/2014   Procedure: INSERTION OF MESH;  Surgeon: Claud Kelp, MD;  Location: [facility];  Service: General;  Laterality: Bilateral;   INSERTION OF MESH Right 07/13/2020   Procedure: INSERTION OF MESH;  Surgeon: Ovidio Kin, MD;  Location: [facility];  Service: General;  Laterality: Right;   KNEE ARTHROSCOPY     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     simvastatin (ZOCOR) 40 MG tablet Take 1 tablet (40 mg total) by mouth daily at 6pm. 90 tablet 3   tamsulosin (FLOMAX) 0.4 MG CAPS capsule Take 1 capsule (0.4 mg total) by mouth daily. 90 capsule 3   No current facility-administered medications for this visit.    No Known Allergies        Goals: Engage in outpatient psychotherapy to manage anxiety and reduce symptoms of depression (sadness, depleted esteem, low motivation). Will continue evaluation (goal date 9-31) and reduce reported symptoms (goal date 6-25). Diagnoses:  Depression, Gen Anxiety  Plan of Care: Outpatient Psychotherapy Patient was seen in the provider's office for session. Session Notes: Teyon says that yesterday he was surprised that as he was leaving the pool at the gym, he saw a  woman he used to know through work. He said that she had once expressed an interest in her and she did that again. She offered to give him her number but he said to her "it is not a good idea". She also offered for them to have lunch and he refused. He said he had no physical attraction to her, but always liked her. He doesn't trust himself because of his dissatisfaction in his marriage. He doesn't want to be in a situation where he has to "sneak around". He doesn't trust tat  he can keep appropriate boundaries with women because of his lack of intimacy with his wife. We discussed his tendency to flirt with women in the absence of his wife. This s primarily related to his strong desire to feel romantic attention. He will work to be mindful of his needs and how it dictates behavior.  He discussed that he has some concerns about the visit with his son and family over Christmas. Wants to make sure that everyone gets along. He states that his son speaks more with his wife and he is often left out. Claims that his wife wants the central role in the family and he doesn't challenge that role. Discussed how to re-engage with family                                                                   Garrel Ridgel, PhD Time Spent: 7:40a-8:30a 50 minutes               Garrel Ridgel, PhD

## 2023-08-20 NOTE — Progress Notes (Signed)
Lab results in range   A1c is almost prediabetic so avoid excess sugars but if  doesn't change   not a concern. Continue lifestyle intervention healthy eating and exercise . And see you next year

## 2023-08-21 ENCOUNTER — Other Ambulatory Visit (HOSPITAL_BASED_OUTPATIENT_CLINIC_OR_DEPARTMENT_OTHER): Payer: Self-pay

## 2023-08-26 ENCOUNTER — Encounter: Payer: Self-pay | Admitting: Internal Medicine

## 2023-08-26 NOTE — Telephone Encounter (Signed)
So  some are fine  and some borderline elevated .  Suggest  send in another set  of BP  readings in  2-4 weeks   .

## 2023-09-01 ENCOUNTER — Other Ambulatory Visit (HOSPITAL_BASED_OUTPATIENT_CLINIC_OR_DEPARTMENT_OTHER): Payer: Self-pay

## 2023-09-01 ENCOUNTER — Other Ambulatory Visit: Payer: Self-pay | Admitting: Urology

## 2023-09-01 ENCOUNTER — Other Ambulatory Visit: Payer: Self-pay

## 2023-09-02 ENCOUNTER — Other Ambulatory Visit: Payer: Self-pay | Admitting: Urology

## 2023-09-02 ENCOUNTER — Other Ambulatory Visit (HOSPITAL_BASED_OUTPATIENT_CLINIC_OR_DEPARTMENT_OTHER): Payer: Self-pay

## 2023-09-03 ENCOUNTER — Other Ambulatory Visit (HOSPITAL_BASED_OUTPATIENT_CLINIC_OR_DEPARTMENT_OTHER): Payer: Self-pay

## 2023-09-22 ENCOUNTER — Other Ambulatory Visit (HOSPITAL_BASED_OUTPATIENT_CLINIC_OR_DEPARTMENT_OTHER): Payer: Self-pay

## 2023-09-24 ENCOUNTER — Encounter: Payer: Self-pay | Admitting: Internal Medicine

## 2023-09-25 ENCOUNTER — Ambulatory Visit: Payer: Federal, State, Local not specified - PPO | Admitting: Psychology

## 2023-09-28 NOTE — Telephone Encounter (Signed)
 Thanks for the update  readings look at goal.

## 2023-10-02 ENCOUNTER — Other Ambulatory Visit (HOSPITAL_BASED_OUTPATIENT_CLINIC_OR_DEPARTMENT_OTHER): Payer: Self-pay

## 2023-10-09 ENCOUNTER — Ambulatory Visit: Payer: Federal, State, Local not specified - PPO | Admitting: Psychology

## 2023-10-09 DIAGNOSIS — F331 Major depressive disorder, recurrent, moderate: Secondary | ICD-10-CM | POA: Diagnosis not present

## 2023-10-09 NOTE — Progress Notes (Signed)
[facility] Counselor Initial Adult Exam  Name: Randall Torres Date: 10/09/2023 MRN: [id number] DOB: 1950-11-16 PCP: Madelin Headings, MD      Guardian/Payee:  N/A    Paperwork requested: No   Reason for Visit /Presenting Problem: Seeking to reduce symptoms of anxiety and depression  Mental Status Exam: Appearance:   Casual     Behavior:  Appropriate  Motor:  Normal  Speech/Language:   Normal Rate  Affect:  Appropriate  Mood:  normal  Thought process:  normal  Thought content:    WNL  Sensory/Perceptual disturbances:    WNL  Orientation:  oriented to person, place, and situation  Attention:  Good  Concentration:  Good  Memory:  WNL  Fund of knowledge:   Good  Insight:    Good  Judgment:   Good  Impulse Control:  Good    Reported Symptoms:  sadness, depleted self esteem, lack of motivation, agitation/worry (anxiety),  Risk Assessment: Danger to Self:  No Self-injurious Behavior: No Danger to Others: No Duty to Warn:no Physical Aggression / Violence:No  Access to Firearms a concern: No  Gang Involvement:No  Patient / guardian was educated about steps to take if suicide or homicide risk level increases between visits: yes While future psychiatric events cannot be accurately predicted, the patient does not currently require acute inpatient psychiatric care and does not currently meet [facility] involuntary commitment criteria.  Substance Abuse History: Current substance abuse: Yes     Past Psychiatric History:   Previous psychological history is significant for depression and alcohol abuse Outpatient Providers:unknown History of Psych Hospitalization: No  Psychological Testing:  unknown    Abuse History:  Victim  of: No.,  N/A    Report needed: No. Victim of Neglect:No. Perpetrator of  N/A   Witness / Exposure to Domestic Violence: No   Protective Services Involvement: No  Witness to [employer] Violence:  No   Family History:  Family History  Problem Relation Age of Onset   Diabetes Other    Other Other        CVA age 73   Other Other        sleep problems   Myasthenia gravis Brother 32       deceased   Hyperlipidemia Other    Alcohol abuse Other        Uncle   Other Mother    Stroke Father    Diabetes Father    Throat cancer Sister    Breast cancer Sister    Healthy Son    Neuropathy Neg Hx     Living situation: the patient lives with their spouse  Sexual Orientation: Straight  Relationship Status: married  Name of spouse / other:unknown If a parent, number of children / ages:2 boys, 47 and 42  Support Systems:  spouse  Financial Stress:  No   Income/Employment/Disability: Neurosurgeon:  unknown  Educational History: Education: post Engineer, maintenance (IT) work or degree  Religion/Sprituality/World View: Catholic  Any cultural differences that may affect / interfere with treatment:  N/A  Recreation/Hobbies: exercise walking  Stressors: Other: retirement    Strengths: Supportive Relationships, Family, and Church  Barriers:  unknown   Legal History: Pending legal issue / charges: The patient has no significant history of legal issues. History of legal issue / charges:  N/A  Medical History/Surgical History: reviewed Past Medical History:  Diagnosis Date   ADD (attention deficit disorder)    ADJUSTMENT DISORDER 09/29/2007   Qualifier: Diagnosis of  By: Fabian Sharp MD, Neta Mends    BPH (benign prostatic hyperplasia)    has seen urologist   Hyperlipidemia    Inguinal hernia    bilateral   Personal history of colonic adenoma 06/30/2008   Personal history of COVID-04 October 2020   Rhinitis 12/31/2010    Past Surgical History:   Procedure Laterality Date   CYSTOSCOPY     INGUINAL HERNIA REPAIR Bilateral 10/18/2014   Procedure: LAPAROSCOPIC BILATERAL INGUINAL HERNIA REPAIR  ;  Surgeon: Claud Kelp, MD;  Location: [facility];  Service: General;  Laterality: Bilateral;   INGUINAL HERNIA REPAIR Right 07/13/2020   Procedure: RIGHT INGUINAL HERNIA REPAIR;  Surgeon: Ovidio Kin, MD;  Location: [facility];  Service: General;  Laterality: Right;   INSERTION OF MESH Bilateral 10/18/2014   Procedure: INSERTION OF MESH;  Surgeon: Claud Kelp, MD;  Location: [facility];  Service: General;  Laterality: Bilateral;   INSERTION OF MESH Right 07/13/2020   Procedure: INSERTION OF MESH;  Surgeon: Ovidio Kin, MD;  Location: [facility];  Service: General;  Laterality: Right;   KNEE ARTHROSCOPY     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     simvastatin (ZOCOR) 40 MG tablet Take 1 tablet (40 mg total) by mouth daily at 6pm. 90 tablet 3   tamsulosin (FLOMAX) 0.4 MG CAPS capsule Take 1 capsule (0.4 mg total) by mouth daily. 90 capsule 3   No current facility-administered medications for this visit.    No Known Allergies        Goals: Engage in outpatient psychotherapy to manage anxiety and reduce symptoms of depression (sadness, depleted esteem, low motivation). Will continue evaluation (goal date 9-31) and reduce reported symptoms (goal date 6-25). Diagnoses:  Depression, Gen Anxiety  Plan of Care: Outpatient Psychotherapy Patient was seen in the provider's office for session. Session Notes: Abel says that he is doing well and the holidays went well for him. He  said that he has been thinking about writing. He is thinking about writing about faith and religion. He likes to read fictional murder mysteries.He is conceptualizing a story of a murder mystery involving a priest, a Clinical research associate a Occupational hygienist and an Chiropractor. He wants to use his own experiences to inform his writing. We talked about expectations and the purpose of writing. He thinks it would be a fun and fulfilling endeavor. He has already started the process. We talked about possible  barriers. He tends to over think issues and go down "rabbit holes". Focussed on the process and what he will find gratifying. Will keep expectations reasonable.                                                                   Garrel Ridgel, PhD Time Spent: 10:40a-11:30a 50 minutes

## 2023-10-22 ENCOUNTER — Ambulatory Visit: Payer: Federal, State, Local not specified - PPO | Admitting: Psychology

## 2023-11-06 ENCOUNTER — Other Ambulatory Visit: Payer: Self-pay | Admitting: Medical Genetics

## 2023-11-12 ENCOUNTER — Other Ambulatory Visit (HOSPITAL_COMMUNITY)
Admission: RE | Admit: 2023-11-12 | Discharge: 2023-11-12 | Disposition: A | Discharge status: Home / Self Care | Payer: Self-pay | Source: Ambulatory Visit | Attending: Medical Genetics | Admitting: Medical Genetics

## 2023-11-13 ENCOUNTER — Other Ambulatory Visit (HOSPITAL_BASED_OUTPATIENT_CLINIC_OR_DEPARTMENT_OTHER): Payer: Self-pay

## 2023-11-19 ENCOUNTER — Ambulatory Visit: Payer: Federal, State, Local not specified - PPO | Admitting: Psychology

## 2023-11-19 DIAGNOSIS — F411 Generalized anxiety disorder: Secondary | ICD-10-CM | POA: Diagnosis not present

## 2023-11-19 DIAGNOSIS — F331 Major depressive disorder, recurrent, moderate: Secondary | ICD-10-CM

## 2023-11-19 NOTE — Progress Notes (Signed)
 [facility] Counselor Initial Adult Exam  Name: Randall Torres Date: 11/19/2023 MRN: [id number] DOB: [birth year] PCP: Madelin Headings, MD      Guardian/Payee:  N/A    Paperwork requested: No   Reason for Visit /Presenting Problem: Seeking to reduce symptoms of anxiety and depression  Mental Status Exam: Appearance:   Casual     Behavior:  Appropriate  Motor:  Normal  Speech/Language:   Normal Rate  Affect:  Appropriate  Mood:  normal  Thought process:  normal  Thought content:    WNL  Sensory/Perceptual disturbances:    WNL  Orientation:  oriented to person, place, and situation  Attention:  Good  Concentration:  Good  Memory:  WNL  Fund of knowledge:   Good  Insight:    Good  Judgment:   Good  Impulse Control:  Good    Reported Symptoms:  sadness, depleted self esteem, lack of motivation, agitation/worry (anxiety),  Risk Assessment: Danger to Self:  No Self-injurious Behavior: No Danger to Others: No Duty to Warn:no Physical Aggression / Violence:No  Access to Firearms a concern: No  Gang Involvement:No  Patient / guardian was educated about steps to take if suicide or homicide risk level increases between visits: yes While future psychiatric events cannot be accurately predicted, the patient does not currently require acute inpatient psychiatric care and does not currently meet [facility] involuntary commitment criteria.  Substance Abuse History: Current substance abuse: Yes     Past Psychiatric History:   Previous psychological history is significant for depression and alcohol abuse Outpatient Providers:unknown History of Psych Hospitalization: No  Psychological Testing:  unknown     Abuse History:  Victim of: No.,  N/A    Report needed: No. Victim of Neglect:No. Perpetrator of  N/A   Witness / Exposure to Domestic Violence: No   Protective Services Involvement: No  Witness to [employer] Violence:  No   Family History:  Family History  Problem Relation Age of Onset   Diabetes Other    Other Other        CVA age 74   Other Other        sleep problems   Myasthenia gravis Brother 32       deceased   Hyperlipidemia Other    Alcohol abuse Other        Uncle   Other Mother    Stroke Father    Diabetes Father    Throat cancer Sister    Breast cancer Sister    Healthy Son    Neuropathy Neg Hx     Living situation: the patient lives with their spouse  Sexual Orientation: Straight  Relationship Status: married  Name of spouse / other:unknown  If a parent, number of children / ages:2 boys, 43 and 42  Support Systems: spouse  Surveyor, quantity Stress:  No   Income/Employment/Disability: Neurosurgeon:  unknown  Educational History: Education: post Engineer, maintenance (IT) work or degree  Religion/Sprituality/World View: Catholic  Any cultural differences that may affect / interfere with treatment:  N/A  Recreation/Hobbies: exercise walking  Stressors: Other: retirement    Strengths: Supportive Relationships, Family, and Church  Barriers:  unknown   Legal History: Pending legal issue / charges: The patient has no significant history of legal issues. History of legal issue / charges:  N/A  Medical History/Surgical History: reviewed Past Medical History:  Diagnosis Date   ADD (attention deficit disorder)    ADJUSTMENT DISORDER 09/29/2007   Qualifier: Diagnosis of  By: Fabian Sharp MD, Neta Mends    BPH (benign prostatic hyperplasia)    has seen urologist   Hyperlipidemia    Inguinal hernia    bilateral   Personal history of colonic adenoma 06/30/2008   Personal history of COVID-04 October 2020   Rhinitis 12/31/2010     Past Surgical History:  Procedure Laterality Date   CYSTOSCOPY     INGUINAL HERNIA REPAIR Bilateral 10/18/2014   Procedure: LAPAROSCOPIC BILATERAL INGUINAL HERNIA REPAIR  ;  Surgeon: Claud Kelp, MD;  Location: [facility];  Service: General;  Laterality: Bilateral;   INGUINAL HERNIA REPAIR Right 07/13/2020   Procedure: RIGHT INGUINAL HERNIA REPAIR;  Surgeon: Ovidio Kin, MD;  Location: [facility];  Service: General;  Laterality: Right;   INSERTION OF MESH Bilateral 10/18/2014   Procedure: INSERTION OF MESH;  Surgeon: Claud Kelp, MD;  Location: [facility];  Service: General;  Laterality: Bilateral;   INSERTION OF MESH Right 07/13/2020   Procedure: INSERTION OF MESH;  Surgeon: Ovidio Kin, MD;  Location: [facility];  Service: General;  Laterality: Right;   KNEE ARTHROSCOPY     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     simvastatin (ZOCOR) 40 MG tablet Take 1 tablet (40 mg total) by mouth daily at 6pm. 90 tablet 3   tamsulosin (FLOMAX) 0.4 MG CAPS capsule Take 1 capsule (0.4 mg total) by mouth daily. 90 capsule 3   No current facility-administered medications for this visit.    No Known Allergies        Goals: Engage in outpatient psychotherapy to manage anxiety and reduce symptoms of depression (sadness, depleted esteem, low motivation). Will continue evaluation (goal date 9-31) and reduce reported symptoms (goal date 6-25). Diagnoses:  Depression, Gen Anxiety  Plan of Care: Outpatient Psychotherapy Patient was seen in the provider's office for session. Session Notes: Randall Torres says that he had been having  conversations/meeting with a male high school friend. He eventually stopped to help "preserve" his marriage. He met with her again and told wife that he plans to meet with his friend monthly. Says that it is just a friendship and not romantic in any way.  He was open with wife Randall Torres that he was distressed by the lack of sex and romance in their life. She admitted that her feelings about romance have changed toward  him. They did not explore in depth at all and nothing about where to go now. When pressed to address Randall Torres more directly with his needs, he says "I am not sure she even likes me". He is very convinced she has no interest at all in advancing their intimate relationship. He feels hurt by the relationship with Randall Torres. He is clear that Randall Torres is only a friend and will remain that way. He struggles to understand what he did to create such animosity from his wife. He is at a loss as to "how he failed her". I suggested that he share feelings with Randall Torres, but he says that he is confident she has no interest. We will discuss his options moving forward.                                                                      Garrel Ridgel, PhD Time Spent: 8:40a-9:30a 50 minutes

## 2023-11-23 LAB — GENECONNECT MOLECULAR SCREEN: Genetic Analysis Overall Interpretation: NEGATIVE

## 2023-11-26 ENCOUNTER — Other Ambulatory Visit (HOSPITAL_BASED_OUTPATIENT_CLINIC_OR_DEPARTMENT_OTHER): Payer: Self-pay

## 2023-12-17 ENCOUNTER — Ambulatory Visit: Admitting: Psychology

## 2023-12-17 ENCOUNTER — Ambulatory Visit: Payer: Federal, State, Local not specified - PPO | Admitting: Psychology

## 2023-12-17 DIAGNOSIS — F331 Major depressive disorder, recurrent, moderate: Secondary | ICD-10-CM

## 2023-12-17 DIAGNOSIS — F32A Depression, unspecified: Secondary | ICD-10-CM

## 2023-12-17 DIAGNOSIS — F411 Generalized anxiety disorder: Secondary | ICD-10-CM

## 2023-12-17 NOTE — Progress Notes (Signed)
 [facility] Counselor Initial Adult Exam  Name: Randall Torres Date: 12/17/2023 MRN: [id number] DOB: 09-18-1950 PCP: Madelin Headings, MD      Guardian/Payee:  N/A    Paperwork requested: No   Reason for Visit /Presenting Problem: Seeking to reduce symptoms of anxiety and depression  Mental Status Exam: Appearance:   Casual     Behavior:  Appropriate  Motor:  Normal  Speech/Language:   Normal Rate  Affect:  Appropriate  Mood:  normal  Thought process:  normal  Thought content:    WNL  Sensory/Perceptual disturbances:    WNL  Orientation:  oriented to person, place, and situation  Attention:  Good  Concentration:  Good  Memory:  WNL  Fund of knowledge:   Good  Insight:    Good  Judgment:   Good  Impulse Control:  Good    Reported Symptoms:  sadness, depleted self esteem, lack of motivation, agitation/worry (anxiety),  Risk Assessment: Danger to Self:  No Self-injurious Behavior: No Danger to Others: No Duty to Warn:no Physical Aggression / Violence:No  Access to Firearms a concern: No  Gang Involvement:No  Patient / guardian was educated about steps to take if suicide or homicide risk level increases between visits: yes While future psychiatric events cannot be accurately predicted, the patient does not currently require acute inpatient psychiatric care and does not currently meet [facility] involuntary commitment criteria.  Substance Abuse History: Current substance abuse: Yes     Past Psychiatric History:   Previous psychological history is significant for depression and alcohol abuse Outpatient Providers:unknown History of Psych Hospitalization: No   Psychological Testing:  unknown    Abuse History:  Victim of: No.,  N/A    Report needed: No. Victim of Neglect:No. Perpetrator of  N/A   Witness / Exposure to Domestic Violence: No   Protective Services Involvement: No  Witness to [employer] Violence:  No   Family History:  Family History  Problem Relation Age of Onset   Diabetes Other    Other Other        CVA age 82   Other Other        sleep problems   Myasthenia gravis Brother 32       deceased   Hyperlipidemia Other    Alcohol abuse Other        Uncle   Other Mother    Stroke Father    Diabetes Father    Throat cancer Sister    Breast cancer Sister    Healthy Son    Neuropathy Neg Hx     Living situation: the patient lives with their  spouse  Sexual Orientation: Straight  Relationship Status: married  Name of spouse / other:unknown If a parent, number of children / ages:2 boys, 26 and 73  Support Systems: spouse  Surveyor, quantity Stress:  No   Income/Employment/Disability: Neurosurgeon:  unknown  Educational History: Education: post Engineer, maintenance (IT) work or degree  Religion/Sprituality/World View: Catholic  Any cultural differences that may affect / interfere with treatment:  N/A  Recreation/Hobbies: exercise walking  Stressors: Other: retirement    Strengths: Supportive Relationships, Family, and Church  Barriers:  unknown   Legal History: Pending legal issue / charges: The patient has no significant history of legal issues. History of legal issue / charges:  N/A  Medical History/Surgical History: reviewed Past Medical History:  Diagnosis Date   ADD (attention deficit disorder)    ADJUSTMENT DISORDER 09/29/2007   Qualifier: Diagnosis of  By: Fabian Sharp MD, Neta Mends    BPH (benign prostatic hyperplasia)    has seen urologist   Hyperlipidemia    Inguinal hernia    bilateral   Personal history of colonic adenoma 06/30/2008   Personal history of COVID-04 October 2020   Rhinitis 12/31/2010    Past Surgical History:  Procedure Laterality Date   CYSTOSCOPY     INGUINAL HERNIA REPAIR Bilateral 10/18/2014   Procedure: LAPAROSCOPIC BILATERAL INGUINAL HERNIA REPAIR  ;  Surgeon: Claud Kelp, MD;  Location: [facility];  Service: General;  Laterality: Bilateral;   INGUINAL HERNIA REPAIR Right 07/13/2020   Procedure: RIGHT INGUINAL HERNIA REPAIR;  Surgeon: Ovidio Kin, MD;  Location: [facility];  Service: General;  Laterality: Right;   INSERTION OF MESH Bilateral 10/18/2014   Procedure: INSERTION OF MESH;  Surgeon: Claud Kelp, MD;  Location: [facility];  Service: General;  Laterality: Bilateral;   INSERTION OF MESH Right 07/13/2020   Procedure: INSERTION OF MESH;  Surgeon: Ovidio Kin, MD;  Location: [facility];  Service: General;  Laterality: Right;   KNEE ARTHROSCOPY     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     simvastatin (ZOCOR) 40 MG tablet Take 1 tablet (40 mg total) by mouth daily at 6pm. 90 tablet 3   tamsulosin (FLOMAX) 0.4 MG CAPS capsule Take 1 capsule (0.4 mg total) by mouth daily. 90 capsule 3   No current facility-administered medications for this visit.    No Known Allergies        Goals: Engage in outpatient psychotherapy to manage anxiety and reduce symptoms of depression (sadness, depleted esteem, low motivation). Will continue evaluation (goal date 9-31) and reduce reported symptoms (goal date 6-25). Diagnoses:  Depression, Gen Anxiety  Plan of Care: Outpatient Psychotherapy Patient was seen in the provider's office for session. Session Notes: Randall Torres  says that he interpreted my feedback from last session that he should continue to keep relationship with old (male) friend. He met with her again for coffee yesterday. He did tell his wife and "it was fine". He says that he can tell her things that he cannot tell many others. He talked about being asked by an old colleague to officiate at his funeral. He is  confused why this man made that request, given they were not close and Randall Torres wasn't especially fond of the man. He discussed his plan for the service.                                                                          Garrel Ridgel, PhD Time Spent:11:40a-12:30p 50 minutes

## 2023-12-22 ENCOUNTER — Other Ambulatory Visit (HOSPITAL_BASED_OUTPATIENT_CLINIC_OR_DEPARTMENT_OTHER): Payer: Self-pay

## 2023-12-22 MED ORDER — COMIRNATY 30 MCG/0.3ML IM SUSY
PREFILLED_SYRINGE | INTRAMUSCULAR | 0 refills | Status: AC
  Filled 2023-12-22: qty 0.3, 1d supply, fill #0

## 2023-12-23 ENCOUNTER — Encounter: Payer: Self-pay | Admitting: Internal Medicine

## 2023-12-29 ENCOUNTER — Other Ambulatory Visit (HOSPITAL_BASED_OUTPATIENT_CLINIC_OR_DEPARTMENT_OTHER): Payer: Self-pay

## 2024-01-21 ENCOUNTER — Ambulatory Visit: Admitting: Psychology

## 2024-01-28 ENCOUNTER — Ambulatory Visit (INDEPENDENT_AMBULATORY_CARE_PROVIDER_SITE_OTHER): Admitting: Psychology

## 2024-01-28 DIAGNOSIS — F411 Generalized anxiety disorder: Secondary | ICD-10-CM | POA: Diagnosis not present

## 2024-01-28 DIAGNOSIS — F32A Depression, unspecified: Secondary | ICD-10-CM

## 2024-01-28 DIAGNOSIS — F331 Major depressive disorder, recurrent, moderate: Secondary | ICD-10-CM

## 2024-01-28 NOTE — Progress Notes (Signed)
 [facility] Counselor Initial Adult Exam  Name: Randall Torres Date: 01/28/2024 MRN: [id number] DOB: [birth year] PCP: Reginal Capra, MD      Guardian/Payee:  N/A    Paperwork requested: No   Reason for Visit /Presenting Problem: Seeking to reduce symptoms of anxiety and depression  Mental Status Exam: Appearance:   Casual     Behavior:  Appropriate  Motor:  Normal  Speech/Language:   Normal Rate  Affect:  Appropriate  Mood:  normal  Thought process:  normal  Thought content:    WNL  Sensory/Perceptual disturbances:    WNL  Orientation:  oriented to person, place, and situation  Attention:  Good  Concentration:  Good  Memory:  WNL  Fund of knowledge:   Good  Insight:    Good  Judgment:   Good  Impulse Control:  Good    Reported Symptoms:  sadness, depleted self esteem, lack of motivation, agitation/worry (anxiety),  Risk Assessment: Danger to Self:  No Self-injurious Behavior: No Danger to Others: No Duty to Warn:no Physical Aggression / Violence:No  Access to Firearms a concern: No  Gang Involvement:No  Patient / guardian was educated about steps to take if suicide or homicide risk level increases between visits: yes While future psychiatric events cannot be accurately predicted, the patient does not currently require acute inpatient psychiatric care and does not currently meet [facility]  involuntary commitment criteria.  Substance Abuse History: Current substance abuse: Yes     Past Psychiatric History:   Previous psychological history is significant for depression and alcohol abuse Outpatient Providers:unknown History  of Psych Hospitalization: No  Psychological Testing: unknown   Abuse History:  Victim of: No., N/A   Report needed: No. Victim of Neglect:No. Perpetrator of N/A  Witness / Exposure to Domestic Violence: No   Protective Services Involvement: No  Witness to [employer] Violence:  No   Family History:  Family History  Problem Relation Age of Onset   Diabetes Other    Other Other        CVA age 58   Other Other        sleep problems   Myasthenia gravis Brother 32       deceased   Hyperlipidemia Other    Alcohol abuse Other        Uncle   Other Mother    Stroke Father    Diabetes Father    Throat cancer Sister    Breast cancer Sister    Healthy Son    Neuropathy Neg Hx  Living situation: the patient lives with their spouse  Sexual Orientation: Straight  Relationship Status: married  Name of spouse / other:unknown If a parent, number of children / ages:2 boys, 83 and 23  Support Systems: spouse  Surveyor, quantity Stress:  No   Income/Employment/Disability: Neurosurgeon: unknown  Educational History: Education: post Engineer, maintenance (IT) work or degree  Religion/Sprituality/World View: Catholic  Any cultural differences that may affect / interfere with treatment:  N/A  Recreation/Hobbies: exercise walking  Stressors: Other: retirement    Strengths: Supportive Relationships, Family, and Church  Barriers:  unknown   Legal History: Pending legal issue / charges: The patient has no significant history of legal issues. History of legal issue / charges: N/A  Medical History/Surgical History: reviewed Past Medical History:  Diagnosis Date   ADD (attention deficit disorder)    ADJUSTMENT DISORDER 09/29/2007   Qualifier: Diagnosis of  By: Ethel Henry MD, Joaquim Muir    BPH (benign prostatic hyperplasia)    has seen urologist   Hyperlipidemia    Inguinal hernia    bilateral   Personal history of colonic adenoma 06/30/2008   Personal history  of COVID-04 October 2020   Rhinitis 12/31/2010    Past Surgical History:  Procedure Laterality Date   CYSTOSCOPY     INGUINAL HERNIA REPAIR Bilateral 10/18/2014   Procedure: LAPAROSCOPIC BILATERAL INGUINAL HERNIA REPAIR  ;  Surgeon: Boyce Byes, MD;  Location: [facility];  Service: General;  Laterality: Bilateral;   INGUINAL HERNIA REPAIR Right 07/13/2020   Procedure: RIGHT INGUINAL HERNIA REPAIR;  Surgeon: Juanita Norlander, MD;  Location: [facility];  Service: General;  Laterality: Right;   INSERTION OF MESH Bilateral 10/18/2014   Procedure: INSERTION OF MESH;  Surgeon: Boyce Byes, MD;  [facility];  Service: General;  Laterality: Bilateral;   INSERTION OF MESH Right 07/13/2020   Procedure: INSERTION OF MESH;  Surgeon: Juanita Norlander, MD;  Location: [facility];  Service: General;  Laterality: Right;   KNEE ARTHROSCOPY     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 0.3 mL 0   desmopressin  (DDAVP ) 0.2 MG tablet Take 400 mcg by mouth at bedtime.     simvastatin  (ZOCOR ) 40 MG tablet Take 1 tablet (40 mg total) by mouth daily at 6pm. 90 tablet 3   tamsulosin  (FLOMAX ) 0.4 MG CAPS capsule Take 1 capsule (0.4 mg total) by mouth daily. 90 capsule 3   No current facility-administered medications for this visit.    No Known Allergies        Goals: Engage in outpatient psychotherapy to manage anxiety and reduce symptoms of depression (sadness, depleted esteem, low motivation). Will continue evaluation (goal date 9-31) and reduce reported symptoms (goal date 6-25). Diagnoses:  Depression, Gen Anxiety  Plan of Care:  Outpatient Psychotherapy Patient was seen in the provider's office for session. Session Notes: Randall Torres says "it feels like there are a lot of people around me that are dying". He said he had talked to wife about exploring retirement communities. This has been on his mind, and he is uncertain about where it will go. Not easy talking to his wife  about the situation. He has been thinking about his own mortality and as a consequence, he is working hard to take care of himself. Is now exercising regularly. He says that he is about to go to a trip to [place]  with son and his family. He does not look forward to going on trip. He doesn't like being out of his routine and he wonders if he is "losing edge of interacting with people". He wants to meet with sibs more than with his kids. He talked about feeling that if he explores his own interests, he can be more enthusiastic about the trip with family. He says that the kids are "very independent" and are not "religious". He sees the ways his grandchildren are being raised is vastly different than the way he was raised. He feels his wife Randall Torres is the "primary grandparent" and he is just along with her. We talked about ways to be more pro-active.                                                                                 Jola Nash, PhD Time Spent:9:40a-10:30p 50 minutes

## 2024-02-03 ENCOUNTER — Ambulatory Visit: Admitting: Family Medicine

## 2024-02-17 ENCOUNTER — Other Ambulatory Visit (HOSPITAL_BASED_OUTPATIENT_CLINIC_OR_DEPARTMENT_OTHER): Payer: Self-pay

## 2024-02-23 ENCOUNTER — Ambulatory Visit: Admitting: Psychology

## 2024-02-25 ENCOUNTER — Ambulatory Visit: Admitting: Psychology

## 2024-02-26 ENCOUNTER — Ambulatory Visit: Admitting: Psychology

## 2024-02-27 ENCOUNTER — Ambulatory Visit: Admitting: Psychology

## 2024-03-03 ENCOUNTER — Ambulatory Visit (INDEPENDENT_AMBULATORY_CARE_PROVIDER_SITE_OTHER): Admitting: Psychology

## 2024-03-03 DIAGNOSIS — F331 Major depressive disorder, recurrent, moderate: Secondary | ICD-10-CM | POA: Diagnosis not present

## 2024-03-03 DIAGNOSIS — F411 Generalized anxiety disorder: Secondary | ICD-10-CM | POA: Diagnosis not present
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
## 2024-03-12 ENCOUNTER — Other Ambulatory Visit (HOSPITAL_BASED_OUTPATIENT_CLINIC_OR_DEPARTMENT_OTHER): Payer: Self-pay

## 2024-03-12 MED ORDER — GEMTESA 75 MG PO TABS
75.0000 mg | ORAL_TABLET | Freq: Every day | ORAL | 3 refills | Status: AC
  Filled 2024-03-12: qty 90, 90d supply, fill #0
  Filled 2024-04-27: qty 30, 30d supply, fill #0
  Filled 2024-04-27: qty 90, 90d supply, fill #0

## 2024-03-15 ENCOUNTER — Other Ambulatory Visit (HOSPITAL_BASED_OUTPATIENT_CLINIC_OR_DEPARTMENT_OTHER): Payer: Self-pay

## 2024-03-18 ENCOUNTER — Other Ambulatory Visit (HOSPITAL_BASED_OUTPATIENT_CLINIC_OR_DEPARTMENT_OTHER): Payer: Self-pay

## 2024-03-23 ENCOUNTER — Other Ambulatory Visit (HOSPITAL_BASED_OUTPATIENT_CLINIC_OR_DEPARTMENT_OTHER): Payer: Self-pay

## 2024-03-24 ENCOUNTER — Ambulatory Visit: Admitting: Psychology

## 2024-03-24 ENCOUNTER — Other Ambulatory Visit (HOSPITAL_BASED_OUTPATIENT_CLINIC_OR_DEPARTMENT_OTHER): Payer: Self-pay

## 2024-03-24 DIAGNOSIS — F331 Major depressive disorder, recurrent, moderate: Secondary | ICD-10-CM

## 2024-03-24 DIAGNOSIS — F32A Depression, unspecified: Secondary | ICD-10-CM | POA: Diagnosis not present

## 2024-03-24 DIAGNOSIS — F411 Generalized anxiety disorder: Secondary | ICD-10-CM | POA: Diagnosis not present

## 2024-03-24 NOTE — Progress Notes (Addendum)
 [facility] Counselor Initial Adult Exam  Name: Randall Torres Date: 03/24/2024 MRN: [id number] DOB: 25-Dec-1950 PCP: Randall Apolinar POUR, MD      Guardian/Payee:  N/A    Paperwork requested: No   Reason for Visit /Presenting Problem: Seeking to reduce symptoms of anxiety and depression  Mental Status Exam: Appearance:   Casual     Behavior:  Appropriate  Motor:  Normal  Speech/Language:   Normal Rate  Affect:  Appropriate  Mood:  normal  Thought process:  normal  Thought content:    WNL  Sensory/Perceptual disturbances:    WNL  Orientation:  oriented to person, place, and situation  Attention:  Good  Concentration:  Good  Memory:  WNL  Fund of knowledge:   Good  Insight:    Good  Judgment:   Good  Impulse Control:  Good    Reported Symptoms:  sadness, depleted self esteem, lack of motivation, agitation/worry (anxiety),  Risk Assessment: Danger to Self:  No Self-injurious Behavior: No Danger to Others: No Duty to Warn:no Physical Aggression / Violence:No  Access to Firearms a concern: No  Gang Involvement:No  Patient / guardian was educated about steps to take if suicide or homicide risk level increases between visits: yes While future psychiatric events cannot be accurately predicted, the patient does not currently require acute inpatient psychiatric care and does not currently meet [place]  involuntary commitment criteria.  Substance Abuse History: Current substance abuse: Yes     Past Psychiatric History:   Previous psychological history is significant for depression  and alcohol abuse Outpatient Providers:unknown History of Psych Hospitalization: No  Psychological Testing: unknown   Abuse History:  Victim of: No., N/A   Report needed: No. Victim of Neglect:No. Perpetrator of N/A  Witness / Exposure to Domestic Violence: No   Protective Services Involvement: No  Witness to [employer] Violence:  No   Family History:  Family History  Problem Relation Age of Onset   Diabetes Other    Other Other        CVA age 43   Other Other        sleep problems   Myasthenia gravis Brother 32       deceased   Hyperlipidemia Other    Alcohol abuse Other        Uncle   Other Mother    Stroke Father  Diabetes Father    Throat cancer Sister    Breast cancer Sister    Healthy Son    Neuropathy Neg Hx     Living situation: the patient lives with their spouse  Sexual Orientation: Straight  Relationship Status: married  Name of spouse / other:unknown If a parent, number of children / ages:2 boys, 45 and 26  Support Systems: spouse  Surveyor, quantity Stress:  No   Income/Employment/Disability: Neurosurgeon: unknown  Educational History: Education: post Engineer, maintenance (IT) work or degree  Religion/Sprituality/World View: Catholic  Any cultural differences that may affect / interfere with treatment:  N/A  Recreation/Hobbies: exercise walking  Stressors: Other: retirement    Strengths: Supportive Relationships, Family, and Church  Barriers:  unknown   Legal History: Pending legal issue / charges: The patient has no significant history of legal issues. History of legal issue / charges: N/A  Medical History/Surgical History: reviewed Past Medical History:  Diagnosis Date   ADD (attention deficit disorder)    ADJUSTMENT DISORDER 09/29/2007   Qualifier: Diagnosis of  By: Charlett MD, Apolinar Torres    BPH (benign prostatic hyperplasia)    has seen urologist   Hyperlipidemia    Inguinal hernia    bilateral   Personal  history of colonic adenoma 06/30/2008   Personal history of COVID-04 October 2020   Rhinitis 12/31/2010    Past Surgical History:  Procedure Laterality Date   CYSTOSCOPY     INGUINAL HERNIA REPAIR Bilateral 10/18/2014   Procedure: LAPAROSCOPIC BILATERAL INGUINAL HERNIA REPAIR  ;  Surgeon: Randall Pacini, MD;  Location: [facility];  Service: General;  Laterality: Bilateral;   INGUINAL HERNIA REPAIR Right 07/13/2020   Procedure: RIGHT INGUINAL HERNIA REPAIR;  Surgeon: Randall Lenis, MD;  Location: [facility];  Service: General;  Laterality: Right;   INSERTION OF MESH Bilateral 10/18/2014   Procedure: INSERTION OF MESH;  Surgeon: Randall Pacini, MD;  Location: [facility];  Service: General;  Laterality: Bilateral;   INSERTION OF MESH Right 07/13/2020   Procedure: INSERTION OF MESH;  Surgeon: Randall Lenis, MD;  Location: [facility];  Service: General;  Laterality: Right;   KNEE ARTHROSCOPY     TOTAL KNEE ARTHROPLASTY  11/08   medial compartmental    TOTAL KNEE ARTHROPLASTY Right 10/28/2022   Procedure: RIGHT TOTAL KNEE ARTHROPLASTY;  Surgeon: Randall Lerner, MD;  Location: [facility];  Service: Orthopedics;  Laterality: Right;    Medications: Current Outpatient Medications  Medication Sig Dispense Refill   COVID-19 mRNA vaccine, Pfizer, (COMIRNATY ) syringe Inject into the muscle. 0.3 mL 0   desmopressin  (DDAVP ) 0.2 MG tablet Take 400 mcg by mouth at bedtime.     simvastatin  (ZOCOR ) 40 MG tablet Take 1 tablet (40 mg total) by mouth daily at 6pm. 90 tablet 3   tamsulosin  (FLOMAX ) 0.4 MG CAPS capsule Take 1 capsule (0.4 mg total) by mouth daily. 90 capsule 3   Vibegron  (GEMTESA ) 75 MG TABS Take 1 tablet (75 mg total) by mouth daily. 90 tablet 3   No current facility-administered medications for this visit.    No Known Allergies        Goals: Engage in outpatient psychotherapy to manage anxiety and reduce symptoms of depression (sadness, depleted esteem, low motivation).  Will continue evaluation (goal date 9-31) and reduce reported symptoms (goal date 12-25). Diagnoses:  Depression, Gen Anxiety  Plan of Care: Outpatient Psychotherapy Patient was seen in the provider's office for session. Session Notes:  Randall Torres says that he has been okay and that a lot is going on. His cousin Gerlene spread Government social research officer brother's [name]) ashes at [employer]. He invited Randall Torres and his other brother to attend, but both declined. The cousin wrote [name] and berated him for not coming. Discussed his feelings about this correspondence and how (or if) to respond.  August 7th is his 15th year anniversary in [place]. Discussed the feelings that get stirred up for him in the preceding month and how to manage those feelings.                                                                                            CONI ALM KERNS, PhD Time Spent:11:40a-12:30p 50 minutes

## 2024-03-26 ENCOUNTER — Other Ambulatory Visit (HOSPITAL_BASED_OUTPATIENT_CLINIC_OR_DEPARTMENT_OTHER): Payer: Self-pay

## 2024-04-03 ENCOUNTER — Other Ambulatory Visit (HOSPITAL_BASED_OUTPATIENT_CLINIC_OR_DEPARTMENT_OTHER): Payer: Self-pay

## 2024-04-09 ENCOUNTER — Other Ambulatory Visit (HOSPITAL_BASED_OUTPATIENT_CLINIC_OR_DEPARTMENT_OTHER): Payer: Self-pay

## 2024-04-20 ENCOUNTER — Other Ambulatory Visit (HOSPITAL_BASED_OUTPATIENT_CLINIC_OR_DEPARTMENT_OTHER): Payer: Self-pay

## 2024-04-23 ENCOUNTER — Other Ambulatory Visit (HOSPITAL_BASED_OUTPATIENT_CLINIC_OR_DEPARTMENT_OTHER): Payer: Self-pay

## 2024-04-26 ENCOUNTER — Other Ambulatory Visit (HOSPITAL_BASED_OUTPATIENT_CLINIC_OR_DEPARTMENT_OTHER): Payer: Self-pay

## 2024-04-26 MED ORDER — AMOXICILLIN 500 MG PO CAPS
ORAL_CAPSULE | ORAL | 0 refills | Status: AC
  Filled 2024-04-26: qty 22, 8d supply, fill #0

## 2024-04-27 ENCOUNTER — Other Ambulatory Visit (HOSPITAL_BASED_OUTPATIENT_CLINIC_OR_DEPARTMENT_OTHER): Payer: Self-pay

## 2024-05-13 ENCOUNTER — Ambulatory Visit (INDEPENDENT_AMBULATORY_CARE_PROVIDER_SITE_OTHER): Admitting: Psychology

## 2024-05-13 ENCOUNTER — Other Ambulatory Visit (HOSPITAL_BASED_OUTPATIENT_CLINIC_OR_DEPARTMENT_OTHER): Payer: Self-pay

## 2024-05-13 DIAGNOSIS — F32A Depression, unspecified: Secondary | ICD-10-CM

## 2024-05-13 DIAGNOSIS — F331 Major depressive disorder, recurrent, moderate: Secondary | ICD-10-CM

## 2024-05-13 DIAGNOSIS — F411 Generalized anxiety disorder: Secondary | ICD-10-CM

## 2024-05-13 NOTE — Progress Notes (Signed)
  Behavioral Health Counselor Initial Adult Exam  Name: SCORPIO FORTIN Date: 05/13/2024 MRN: [id number] DOB: 1951/03/04 PCP: Charlett Apolinar POUR, MD      Guardian/Payee:  N/A    Paperwork requested: No   Reason for Visit /Presenting Problem: Seeking to reduce symptoms of anxiety and depression  Mental Status Exam: Appearance:   Casual     Behavior:  Appropriate  Motor:  Normal  Speech/Language:   Normal Rate  Affect:  Appropriate  Mood:  normal  Thought process:  normal  Thought content:    WNL  Sensory/Perceptual disturbances:    WNL  Orientation:  oriented to person, place, and situation  Attention:  Good  Concentration:  Good  Memory:  WNL  Fund of knowledge:   Good  Insight:    Good  Judgment:   Good  Impulse Control:  Good    Reported Symptoms:  sadness, depleted self esteem, lack of motivation, agitation/worry (anxiety),  Risk Assessment: Danger to Self:  No Self-injurious Behavior: No Danger to Others: No Duty to Warn:no Physical Aggression / Violence:No  Access to Firearms a concern: No  Gang Involvement:No  Patient / guardian was educated about steps to take if suicide or homicide risk level increases between visits: yes While future psychiatric events cannot be accurately predicted, the patient does not currently require acute inpatient psychiatric care and does not currently meet [place]  involuntary commitment criteria.  Substance Abuse History: Current substance abuse: Yes     Past Psychiatric History:   Previous psychological  history is significant for depression and alcohol abuse Outpatient Providers:unknown History of Psych Hospitalization: No  Psychological Testing: unknown   Abuse History:  Victim of: No., N/A   Report needed: No. Victim of Neglect:No. Perpetrator of N/A  Witness / Exposure to Domestic Violence: No   Protective Services Involvement: No  Witness to [employer] Violence:  No   Family History:  Family History  Problem Relation Age of Onset   Diabetes Other    Other Other        CVA age 40   Other Other        sleep problems   Myasthenia gravis Brother 32       deceased   Hyperlipidemia Other    Alcohol abuse Other  Uncle   Other Mother    Stroke Father    Diabetes Father    Throat cancer Sister    Breast cancer Sister    Healthy Son    Neuropathy Neg Hx     Living situation: the patient lives with their spouse  Sexual Orientation: Straight  Relationship Status: married  Name of spouse / other:unknown If a parent, number of children / ages:2 boys, 49 and 5  Support Systems: spouse  Surveyor, quantity Stress:  No   Income/Employment/Disability: Neurosurgeon: unknown  Educational History: Education: post Engineer, maintenance (IT) work or degree  Religion/Sprituality/World View: Catholic  Any cultural differences that may affect / interfere with treatment:  N/A  Recreation/Hobbies: exercise walking  Stressors: Other: retirement    Strengths: Supportive Relationships, Family, and Church  Barriers:  unknown   Legal History: Pending legal issue / charges: The patient has no significant history of legal issues. History of legal issue / charges: N/A  Medical History/Surgical History: reviewed Past Medical History:  Diagnosis Date   ADD (attention deficit disorder)    ADJUSTMENT DISORDER 09/29/2007   Qualifier: Diagnosis of  By: Charlett MD, Apolinar POUR    BPH (benign prostatic hyperplasia)    has seen urologist   Hyperlipidemia     Inguinal hernia    bilateral   Personal history of colonic adenoma 06/30/2008   Personal history of COVID-04 October 2020   Rhinitis 12/31/2010    Past Surgical History:  Procedure Laterality Date   CYSTOSCOPY     INGUINAL HERNIA REPAIR Bilateral 10/18/2014   Procedure: LAPAROSCOPIC BILATERAL INGUINAL HERNIA REPAIR  ;  Surgeon: Elon Pacini, MD;  Location: [facility];  Service: General;  Laterality: Bilateral;   INGUINAL HERNIA REPAIR Right 07/13/2020   Procedure: RIGHT INGUINAL HERNIA REPAIR;  Surgeon: Ethyl Lenis, MD;  Location: [facility];  Service: General;  Laterality: Right;   INSERTION OF MESH Bilateral 10/18/2014   Procedure: INSERTION OF MESH;  Surgeon: Elon Pacini, MD;  Location: [facility];  Service: General;  Laterality: Bilateral;   INSERTION OF MESH Right 07/13/2020   Procedure: INSERTION OF MESH;  Surgeon: Ethyl Lenis, MD;  Location: [facility];  Service: General;  Laterality: Right;   KNEE ARTHROSCOPY     TOTAL KNEE ARTHROPLASTY  11/08   medial compartmental    TOTAL KNEE ARTHROPLASTY Right 10/28/2022   Procedure: RIGHT TOTAL KNEE ARTHROPLASTY;  Surgeon: Liam Lerner, MD;  Location: [facility];  Service: Orthopedics;  Laterality: Right;    Medications: Current Outpatient Medications  Medication Sig Dispense Refill   COVID-19 mRNA vaccine, Pfizer, (COMIRNATY ) syringe Inject into the muscle. 0.3 mL 0   desmopressin  (DDAVP ) 0.2 MG tablet Take 400 mcg by mouth at bedtime.     simvastatin  (ZOCOR ) 40 MG tablet Take 1 tablet (40 mg total) by mouth daily at 6pm. 90 tablet 3   tamsulosin  (FLOMAX ) 0.4 MG CAPS capsule Take 1 capsule (0.4 mg total) by mouth daily. 90 capsule 3   Vibegron  (GEMTESA ) 75 MG TABS Take 1 tablet (75 mg total) by mouth daily. 90 tablet 3   No current facility-administered medications for this visit.    No Known Allergies        Goals: Engage in outpatient psychotherapy to manage anxiety and reduce symptoms of depression  (sadness, depleted esteem, low motivation). Will continue evaluation (goal date 9-31) and reduce reported symptoms (goal date 12-25). Diagnoses:  Depression, Gen Anxiety  Plan of Care:  Outpatient Psychotherapy Patient was seen in the provider's office for session. Session Notes: Keyvin says that his brother in [place] has a son who is getting divorced and is in a custody battle over the children (2). He is trying to enlist Jaimen to help by writing a letter of support. Discussed how to address the request, which Domnic feels puts him in an awkward position. He chose to send his brother in [place] (who doesn't talk to him) a birthday card. He feels good about the decision. Racer went to [place] and was there for a week to be with son and his family. He said that upon his return, his wife said I was not sure if you missed me. He said that the relationship is rocky and it feels awkward. States that in one week, he got used to a world without her. Says that it was a tough transition back. When he first saw her, gave her a side hug. He claims that it is not easy to hug his wife. Talked about Love Languages. Suggested he get that book and read so we can discuss. He talked about an old colleague that never married and is relying on Raymie. This man has never married and looks to Ozell for support. Discussed how to set limits with this man so that he doesn't get overwhelmed with his needs.                                                                                                    CONI ALM KERNS, PhD Time Spent:7:40a-8:30p 50 minutes

## 2024-05-20 ENCOUNTER — Ambulatory Visit: Admitting: Psychology

## 2024-06-30 ENCOUNTER — Ambulatory Visit (INDEPENDENT_AMBULATORY_CARE_PROVIDER_SITE_OTHER): Admitting: Psychology

## 2024-06-30 DIAGNOSIS — F32A Depression, unspecified: Secondary | ICD-10-CM | POA: Diagnosis not present

## 2024-06-30 DIAGNOSIS — F411 Generalized anxiety disorder: Secondary | ICD-10-CM

## 2024-06-30 DIAGNOSIS — F331 Major depressive disorder, recurrent, moderate: Secondary | ICD-10-CM

## 2024-06-30 NOTE — Progress Notes (Signed)
 [facility] Counselor Initial Adult Exam  Name: Randall Torres Date: 06/30/2024 MRN: [id number] DOB: Feb 05, 1951 PCP: Charlett Apolinar POUR, MD      Guardian/Payee:  N/A    Paperwork requested: No   Reason for Visit /Presenting Problem: Seeking to reduce symptoms of anxiety and depression  Mental Status Exam: Appearance:   Casual     Behavior:  Appropriate  Motor:  Normal  Speech/Language:   Normal Rate  Affect:  Appropriate  Mood:  normal  Thought process:  normal  Thought content:    WNL  Sensory/Perceptual disturbances:    WNL  Orientation:  oriented to person, place, and situation  Attention:  Good  Concentration:  Good  Memory:  WNL  Fund of knowledge:   Good  Insight:    Good  Judgment:   Good  Impulse Control:  Good    Reported Symptoms:  sadness, depleted self esteem, lack of motivation, agitation/worry (anxiety),  Risk Assessment: Danger to Self:  No Self-injurious Behavior: No Danger to Others: No Duty to Warn:no Physical Aggression / Violence:No  Access to Firearms a concern: No  Gang Involvement:No  Patient / guardian was educated about steps to take if suicide or homicide risk level increases between visits: yes While future psychiatric events cannot be accurately predicted, the patient does not currently require acute inpatient psychiatric care and does not currently meet [place]  involuntary commitment criteria.  Substance Abuse History: Current substance abuse: Yes     Past Psychiatric History:    Previous psychological history is significant for depression and alcohol abuse Outpatient Providers:unknown History of Psych Hospitalization: No  Psychological Testing: unknown   Abuse History:  Victim of: No., N/A   Report needed: No. Victim of Neglect:No. Perpetrator of N/A  Witness / Exposure to Domestic Violence: No   Protective Services Involvement: No  Witness to [employer] Violence:  No   Family History:  Family History  Problem Relation Age of Onset   Diabetes Other    Other Other        CVA age 81   Other Other        sleep problems   Myasthenia gravis Brother 42  deceased   Hyperlipidemia Other    Alcohol abuse Other        Uncle   Other Mother    Stroke Father    Diabetes Father    Throat cancer Sister    Breast cancer Sister    Healthy Son    Neuropathy Neg Hx     Living situation: the patient lives with their spouse  Sexual Orientation: Straight  Relationship Status: married  Name of spouse / other:unknown If a parent, number of children / ages:2 boys, 68 and 72  Support Systems: spouse  Surveyor, quantity Stress:  No   Income/Employment/Disability: Neurosurgeon: unknown  Educational History: Education: post Engineer, maintenance (IT) work or degree  Religion/Sprituality/World View: Catholic  Any cultural differences that may affect / interfere with treatment:  N/A  Recreation/Hobbies: exercise walking  Stressors: Other: retirement    Strengths: Supportive Relationships, Family, and Church  Barriers:  unknown   Legal History: Pending legal issue / charges: The patient has no significant history of legal issues. History of legal issue / charges: N/A  Medical History/Surgical History: reviewed Past Medical History:  Diagnosis Date   ADD (attention deficit disorder)    ADJUSTMENT DISORDER 09/29/2007   Qualifier: Diagnosis of  By: Charlett MD, Apolinar POUR    BPH (benign prostatic hyperplasia)    has seen urologist    Hyperlipidemia    Inguinal hernia    bilateral   Personal history of colonic adenoma 06/30/2008   Personal history of COVID-04 October 2020   Rhinitis 12/31/2010    Past Surgical History:  Procedure Laterality Date   CYSTOSCOPY     INGUINAL HERNIA REPAIR Bilateral 10/18/2014   Procedure: LAPAROSCOPIC BILATERAL INGUINAL HERNIA REPAIR  ;  Surgeon: Elon Pacini, MD;  Location: [facility];  Service: General;  Laterality: Bilateral;   INGUINAL HERNIA REPAIR Right 07/13/2020   Procedure: RIGHT INGUINAL HERNIA REPAIR;  Surgeon: Ethyl Lenis, MD;  Location: [facility];  Service: General;  Laterality: Right;   INSERTION OF MESH Bilateral 10/18/2014   Procedure: INSERTION OF MESH;  Surgeon: Elon Pacini, MD;  Location: [facility];  Service: General;  Laterality: Bilateral;   INSERTION OF MESH Right 07/13/2020   Procedure: INSERTION OF MESH;  Surgeon: Ethyl Lenis, MD;  Location: [facility];  Service: General;  Laterality: Right;   KNEE ARTHROSCOPY     TOTAL KNEE ARTHROPLASTY  11/08   medial compartmental    TOTAL KNEE ARTHROPLASTY Right 10/28/2022   Procedure: RIGHT TOTAL KNEE ARTHROPLASTY;  Surgeon: Liam Lerner, MD;  Location: [facility];  Service: Orthopedics;  Laterality: Right;    Medications: Current Outpatient Medications  Medication Sig Dispense Refill   COVID-19 mRNA vaccine, Pfizer, (COMIRNATY ) syringe Inject into the muscle. 0.3 mL 0   desmopressin  (DDAVP ) 0.2 MG tablet Take 400 mcg by mouth at bedtime.     simvastatin  (ZOCOR ) 40 MG tablet Take 1 tablet (40 mg total) by mouth daily at 6pm. 90 tablet 3   tamsulosin  (FLOMAX ) 0.4 MG CAPS capsule Take 1 capsule (0.4 mg total) by mouth daily. 90 capsule 3   Vibegron  (GEMTESA ) 75 MG TABS Take 1 tablet (75 mg total) by mouth daily. 90 tablet 3   No current facility-administered medications for this visit.    No Known Allergies        Goals: Engage in outpatient psychotherapy to manage anxiety and reduce  symptoms of depression (sadness, depleted esteem, low motivation). Will continue evaluation (goal  date 9-31) and reduce reported symptoms (goal date 12-25). Diagnoses:  Depression, Gen Anxiety  Plan of Care: Outpatient Psychotherapy Patient was seen in the provider's office for session. Session Notes: Rahiem says this will be last session for couple of months due to both of our schedules. Went to wife's family reunion last week and it was not easy. He worked most of the event. He and wife have company coming to visit next week.  He talked about a woman that he fell in love with when he first got into AA. They kissed and touched, but it ended when his wife saw a text that made her uncomfortable. He broke it off and changed meeting sites. Has not seen her in 13 years until she walked into his meeting last week. He states he was not sure what to do. He walked up and gave her a hug at end of meeting and she told him she wanted to apologize, but did not articulate why she felt a need to apologize. Stirred him up, but she has not been back to his meeting.                                                                                                       CONI ALM KERNS, PhD Time Spent:9:40a-10:30p 50 minutes

## 2024-07-14 ENCOUNTER — Other Ambulatory Visit (HOSPITAL_BASED_OUTPATIENT_CLINIC_OR_DEPARTMENT_OTHER): Payer: Self-pay

## 2024-07-14 MED ORDER — HYDROCODONE-ACETAMINOPHEN 5-325 MG PO TABS
ORAL_TABLET | ORAL | 0 refills | Status: DC
  Filled 2024-07-14: qty 20, 4d supply, fill #0

## 2024-08-11 ENCOUNTER — Ambulatory Visit: Admitting: Psychology

## 2024-08-11 DIAGNOSIS — F331 Major depressive disorder, recurrent, moderate: Secondary | ICD-10-CM

## 2024-08-11 NOTE — Progress Notes (Signed)
 [facility] Counselor Initial Adult Exam  Name: Randall Torres Date: 08/11/2024 MRN: [id number] DOB: 1951-07-09 PCP: Charlett Apolinar POUR, MD      Guardian/Payee:  N/A    Paperwork requested: No   Reason for Visit /Presenting Problem: Seeking to reduce symptoms of anxiety and depression  Mental Status Exam: Appearance:   Casual     Behavior:  Appropriate  Motor:  Normal  Speech/Language:   Normal Rate  Affect:  Appropriate  Mood:  normal  Thought process:  normal  Thought content:    WNL  Sensory/Perceptual disturbances:    WNL  Orientation:  oriented to person, place, and situation  Attention:  Good  Concentration:  Good  Memory:  WNL  Fund of knowledge:   Good  Insight:    Good  Judgment:   Good  Impulse Control:  Good    Reported Symptoms:  sadness, depleted self esteem, lack of motivation, agitation/worry (anxiety),  Risk Assessment: Danger to Self:  No Self-injurious Behavior: No Danger to Others: No Duty to Warn:no Physical Aggression / Violence:No  Access to Firearms a concern: No  Gang Involvement:No  Patient / guardian was educated about steps to take if suicide or homicide risk level increases between visits: yes While future psychiatric events cannot be accurately predicted, the patient does not currently require acute inpatient psychiatric care and does not currently meet [place]  involuntary commitment criteria.  Substance Abuse History: Current substance abuse: Yes      Past Psychiatric History:   Previous psychological history is significant for depression and alcohol abuse Outpatient Providers:unknown History of Psych Hospitalization: No  Psychological Testing: unknown   Abuse History:  Victim of: No., N/A   Report needed: No. Victim of Neglect:No. Perpetrator of N/A  Witness / Exposure to Domestic Violence: No   Protective Services Involvement: No  Witness to [employer] Violence:  No   Family History:  Family History  Problem Relation Age of Onset   Diabetes Other    Other Other        CVA age 57   Other Other  sleep problems   Myasthenia gravis Brother 32       deceased   Hyperlipidemia Other    Alcohol abuse Other        Uncle   Other Mother    Stroke Father    Diabetes Father    Throat cancer Sister    Breast cancer Sister    Healthy Son    Neuropathy Neg Hx     Living situation: the patient lives with their spouse  Sexual Orientation: Straight  Relationship Status: married  Name of spouse / other:unknown If a parent, number of children / ages:2 boys, 60 and 42  Support Systems: spouse  Surveyor, Quantity Stress:  No   Income/Employment/Disability: Neurosurgeon: unknown  Educational History: Education: post engineer, maintenance (it) work or degree  Religion/Sprituality/World View: Catholic  Any cultural differences that may affect / interfere with treatment:  N/A  Recreation/Hobbies: exercise walking  Stressors: Other: retirement    Strengths: Supportive Relationships, Family, and Church  Barriers:  unknown   Legal History: Pending legal issue / charges: The patient has no significant history of legal issues. History of legal issue / charges: N/A  Medical History/Surgical History: reviewed Past Medical History:  Diagnosis Date   ADD (attention deficit disorder)    ADJUSTMENT DISORDER 09/29/2007   Qualifier: Diagnosis of  By: Charlett MD, Apolinar POUR    BPH (benign prostatic  hyperplasia)    has seen urologist   Hyperlipidemia    Inguinal hernia    bilateral   Personal history of colonic adenoma 06/30/2008   Personal history of COVID-04 October 2020   Rhinitis 12/31/2010    Past Surgical History:  Procedure Laterality Date   CYSTOSCOPY     INGUINAL HERNIA REPAIR Bilateral 10/18/2014   Procedure: LAPAROSCOPIC BILATERAL INGUINAL HERNIA REPAIR  ;  Surgeon: Elon Pacini, MD;  Location: [facility];  Service: General;  Laterality: Bilateral;   INGUINAL HERNIA REPAIR Right 07/13/2020   Procedure: RIGHT INGUINAL HERNIA REPAIR;  Surgeon: Ethyl Lenis, MD;  Location: [facility];  Service: General;  Laterality: Right;   INSERTION OF MESH Bilateral 10/18/2014   Procedure: INSERTION OF MESH;  Surgeon: Elon Pacini, MD;  Location: [facility];  Service: General;  Laterality: Bilateral;   INSERTION OF MESH Right 07/13/2020   Procedure: INSERTION OF MESH;  Surgeon: Ethyl Lenis, MD;  Location: [facility];  Service: General;  Laterality: Right;   KNEE ARTHROSCOPY     TOTAL KNEE ARTHROPLASTY  11/08   medial compartmental    TOTAL KNEE ARTHROPLASTY Right 10/28/2022   Procedure: RIGHT TOTAL KNEE ARTHROPLASTY;  Surgeon: Liam Lerner, MD;  Location: [facility];  Service: Orthopedics;  Laterality: Right;    Medications: Current Outpatient Medications  Medication Sig Dispense Refill   COVID-19 mRNA vaccine, Pfizer, (COMIRNATY ) syringe Inject into the muscle. 0.3 mL 0   desmopressin  (DDAVP ) 0.2 MG tablet Take 400 mcg by mouth at bedtime.     HYDROcodone -acetaminophen  (NORCO/VICODIN) 5-325 MG tablet Take 1 tablet by mouth every 6 hours as needed for pain. 20 tablet 0   simvastatin  (ZOCOR ) 40 MG tablet Take 1 tablet (40 mg total) by mouth daily at 6pm. 90 tablet 3   tamsulosin  (FLOMAX ) 0.4 MG CAPS capsule Take 1 capsule (0.4 mg total) by mouth daily. 90 capsule 3   Vibegron  (GEMTESA ) 75 MG TABS Take 1 tablet (75 mg total) by mouth daily. 90 tablet 3   No  current facility-administered medications for this  visit.    No Known Allergies        Goals: Engage in outpatient psychotherapy to manage anxiety and reduce symptoms of depression (sadness, depleted esteem, low motivation). Will continue evaluation (goal date 9-31) and reduce reported symptoms (goal date 12-25). Diagnoses:  Depression, Gen Anxiety  Plan of Care: Outpatient Psychotherapy Patient was seen in the provider's office for session. Session Notes: Randall Torres talked about his experience this morning when he went to an AA meeting and saw the woman he once had a relationship with. It stirs up his emotions when he sees her. He had a conversation with her and told her that he is okay with her coming to meetings, but they need to have boundaries. He was glad to see her and acknowledges he still has feelings for her. He knows, however, that he can't have her, though he would like to. He thinks he will be okay to see her from time to time. We talked about how to be realistic and respond moving forward.  He states that he and his wife continue to have some interpersonal struggles. He feels that he an his wife are in an economic relationship that is void of love and kindness. He feels wife is replicating the relationship her parents had with each other. Will not ever consider ending his marriage and feels he has to stay with her.  He talked with his brother who's son lost custody of his children. He feels helpless to assist him, but brother has expressed gratitude. Randall Torres is feeling mostly stable and willing to make to most of his situation at home and in his FOO relationships.                                                                                                          Randall ALM KERNS, PhD Time Spent:9:40a-10:30p 50 minutes

## 2024-08-18 NOTE — Progress Notes (Unsigned)
 No chief complaint on file.   HPI: Patient  Randall Torres  73 y.o. comes in today for Preventive Health Care visit   Health Maintenance  Topic Date Due   Medicare Annual Wellness (AWV)  Never done   COVID-19 Vaccine (9 - 2025-26 season) 05/17/2024   DTaP/Tdap/Td (3 - Td or Tdap) 12/05/2024   Colonoscopy  08/03/2028   Pneumococcal Vaccine: 50+ Years  Completed   Influenza Vaccine  Completed   Hepatitis C Screening  Completed   Zoster Vaccines- Shingrix  Completed   Meningococcal B Vaccine  Aged Out   Hepatitis B Vaccines 19-59 Average Risk  Discontinued   Health Maintenance Review LIFESTYLE:  Exercise:   Tobacco/ETS: Alcohol:  Sugar beverages: Sleep: Drug use: no HH of  Work:    ROS:  GEN/ HEENT: No fever, significant weight changes sweats headaches vision problems hearing changes, CV/ PULM; No chest pain shortness of breath cough, syncope,edema  change in exercise tolerance. GI /GU: No adominal pain, vomiting, change in bowel habits. No blood in the stool. No significant GU symptoms. SKIN/HEME: ,no acute skin rashes suspicious lesions or bleeding. No lymphadenopathy, nodules, masses.  NEURO/ PSYCH:  No neurologic signs such as weakness numbness. No depression anxiety. IMM/ Allergy: No unusual infections.  Allergy .   REST of 12 system review negative except as per HPI   Past Medical History:  Diagnosis Date   ADD (attention deficit disorder)    ADJUSTMENT DISORDER 09/29/2007   Qualifier: Diagnosis of  By: Charlett MD, Apolinar POUR    BPH (benign prostatic hyperplasia)    has seen urologist   Hyperlipidemia    Inguinal hernia    bilateral   Personal history of colonic adenoma 06/30/2008   Personal history of COVID-04 October 2020   Rhinitis 12/31/2010    Past Surgical History:  Procedure Laterality Date   CYSTOSCOPY     INGUINAL HERNIA REPAIR Bilateral 10/18/2014   Procedure: LAPAROSCOPIC BILATERAL INGUINAL HERNIA REPAIR  ;  Surgeon: Elon Pacini, MD;   Location: [facility];  Service: General;  Laterality: Bilateral;   INGUINAL HERNIA REPAIR Right 07/13/2020   Procedure: RIGHT INGUINAL HERNIA REPAIR;  Surgeon: Ethyl Lenis, MD;  Location: [facility];  Service: General;  Laterality: Right;   INSERTION OF MESH Bilateral 10/18/2014   Procedure: INSERTION OF MESH;  Surgeon: Elon Pacini, MD;  [facility];  Service: General;  Laterality: Bilateral;   INSERTION OF MESH Right 07/13/2020   Procedure: INSERTION OF MESH;  Surgeon: Ethyl Lenis, MD;  Location: [facility];  Service: General;  Laterality: Right;   KNEE ARTHROSCOPY     TOTAL KNEE ARTHROPLASTY  11/08   medial compartmental    TOTAL KNEE ARTHROPLASTY Right 10/28/2022   Procedure: RIGHT TOTAL KNEE ARTHROPLASTY;  Surgeon: Liam Lerner, MD;  [facility];  Service: Orthopedics;  Laterality: Right;    Family History  Problem Relation Age of Onset   Diabetes Other    Other Other        CVA age 23   Other Other        sleep problems   Myasthenia gravis Brother 32       deceased   Hyperlipidemia Other    Alcohol abuse Other        Uncle   Other Mother    Stroke Father    Diabetes Father    Throat cancer Sister    Breast cancer Sister    Healthy  Son    Neuropathy Neg Hx     Social History   Socioeconomic History   Marital status: Married    Spouse name: Not on file   Number of children: Not on file   Years of education: Not on file   Highest education level: Professional school degree (e.g., MD, DDS, DVM, JD)  Occupational History   Not on file  Tobacco Use   Smoking status: Never   Smokeless tobacco: Never  Vaping Use   Vaping status: Never Used  Substance and Sexual Activity   Alcohol use: No    Alcohol/week: 0.0 standard drinks of alcohol    Comment: Hx of alcohol abuse - none since 2010   Drug use: No   Sexual activity: Not on file  Other Topics Concern   Not on file  Social History Narrative   Occupation: Publishing Rights Manager.  retired 2021   Highest level of education: law school   Married   Exercise: Swims daily  5-6 days per week.    Goes to AA  5 day per week or so.      HH of 2 dog     Wife x for breast cancer    2 children.   Lives with wife in a 2 story home.    Social Drivers of Corporate Investment Banker Strain: Low Risk  (08/15/2024)   Overall Financial Resource Strain (CARDIA)    Difficulty of Paying Living Expenses: Not hard at all  Food Insecurity: No Food Insecurity (08/15/2024)   Hunger Vital Sign    Worried About Running Out of Food in the Last Year: Never true    Ran Out of Food in the Last Year: Never true  Transportation Needs: No Transportation Needs (08/15/2024)   PRAPARE - Administrator, Civil Service (Medical): No    Lack of Transportation (Non-Medical): No  Physical Activity: Sufficiently Active (08/15/2024)   Exercise Vital Sign    Days of Exercise per Week: 7 days    Minutes of Exercise per Session: 130 min  Stress: No Stress Concern Present (08/15/2024)   [employer] of Occupational Health - Occupational Stress Questionnaire    Feeling of Stress: Not at all  Social Connections: Socially Integrated (08/15/2024)   Social Connection and Isolation Panel    Frequency of Communication with Friends and Family: More than three times a week    Frequency of Social Gatherings with Friends and Family: Once a week    Attends Religious Services: More than 4 times per year    Active Member of [employer] or Organizations: Yes    Attends Engineer, Structural: More than 4 times per year    Marital Status: Married    Outpatient Medications Prior to Visit  Medication Sig Dispense Refill   COVID-19 mRNA vaccine, Pfizer, (COMIRNATY ) syringe Inject into the muscle. 0.3 mL 0   desmopressin  (DDAVP ) 0.2 MG tablet Take 400 mcg by mouth at bedtime.     HYDROcodone -acetaminophen  (NORCO/VICODIN) 5-325 MG tablet Take 1 tablet by mouth every 6 hours as needed for pain. 20  tablet 0   simvastatin  (ZOCOR ) 40 MG tablet Take 1 tablet (40 mg total) by mouth daily at 6pm. 90 tablet 3   tamsulosin  (FLOMAX ) 0.4 MG CAPS capsule Take 1 capsule (0.4 mg total) by mouth daily. 90 capsule 3   Vibegron  (GEMTESA ) 75 MG TABS Take 1 tablet (75 mg total) by mouth daily. 90 tablet 3   No facility-administered medications prior  to visit.     EXAM:  There were no vitals taken for this visit.  There is no height or weight on file to calculate BMI. Wt Readings from Last 3 Encounters:  08/19/23 178 lb 3.2 oz (80.8 kg)  06/11/23 175 lb 14.4 oz (79.8 kg)  04/14/23 175 lb (79.4 kg)    Physical Exam: Vital signs reviewed HZW:Uypd is a well-developed well-nourished alert cooperative    who appearsr stated age in no acute distress.  HEENT: normocephalic atraumatic , Eyes: PERRL EOM's full, conjunctiva clear, Nares: paten,t no deformity discharge or tenderness., Ears: no deformity EAC's clear TMs with normal landmarks. Mouth: clear OP, no lesions, edema.  Moist mucous membranes. Dentition in adequate repair. NECK: supple without masses, thyromegaly or bruits. CHEST/PULM:  Clear to auscultation and percussion breath sounds equal no wheeze , rales or rhonchi. No chest wall deformities or tenderness. Breast: normal by inspection . No dimpling, discharge, masses, tenderness or discharge . CV: PMI is nondisplaced, S1 S2 no gallops, murmurs, rubs. Peripheral pulses are full without delay.No JVD .  ABDOMEN: Bowel sounds normal nontender  No guard or rebound, no hepato splenomegal no CVA tenderness.  No hernia. Extremtities:  No clubbing cyanosis or edema, no acute joint swelling or redness no focal atrophy NEURO:  Oriented x3, cranial nerves 3-12 appear to be intact, no obvious focal weakness,gait within normal limits no abnormal reflexes or asymmetrical SKIN: No acute rashes normal turgor, color, no bruising or petechiae. PSYCH: Oriented, good eye contact, no obvious depression anxiety,  cognition and judgment appear normal. LN: no cervical axillary inguinal adenopathy  Lab Results  Component Value Date   WBC 7.4 08/19/2023   HGB 13.4 08/19/2023   HCT 40.6 08/19/2023   PLT 270.0 08/19/2023   GLUCOSE 89 08/19/2023   CHOL 162 08/19/2023   TRIG 59.0 08/19/2023   HDL 54.30 08/19/2023   LDLDIRECT 147.2 11/29/2009   LDLCALC 96 08/19/2023   ALT 21 08/19/2023   AST 28 08/19/2023   NA 135 08/19/2023   K 4.1 08/19/2023   CL 99 08/19/2023   CREATININE 0.73 08/19/2023   BUN 18 08/19/2023   CO2 31 08/19/2023   TSH 1.54 08/19/2023   PSA 1.18 08/19/2023   INR 1.0 03/12/2021   HGBA1C 6.1 08/19/2023    BP Readings from Last 3 Encounters:  08/19/23 (!) 148/84  06/11/23 124/70  10/28/22 131/69    Lab results reviewed with patient   ASSESSMENT AND PLAN:  Discussed the following assessment and plan:    ICD-10-CM   1. Visit for preventive health examination  Z00.00     2. Medication management  Z79.899     3. Hyperlipidemia, unspecified hyperlipidemia type  E78.5      No follow-ups on file.  Patient Care Team: Karysa Heft K, MD as PCP - General Patel, Donika K, DO as Consulting Physician (Neurology) There are no Patient Instructions on file for this visit.  Tiwan Schnitker K. Diamante Truszkowski M.D.

## 2024-08-19 ENCOUNTER — Ambulatory Visit: Payer: Federal, State, Local not specified - PPO | Admitting: Internal Medicine

## 2024-08-19 ENCOUNTER — Encounter: Payer: Self-pay | Admitting: Internal Medicine

## 2024-08-19 VITALS — BP 132/78 | HR 93 | Temp 98.0°F | Ht 68.0 in | Wt 175.2 lb

## 2024-08-19 DIAGNOSIS — Z79899 Other long term (current) drug therapy: Secondary | ICD-10-CM | POA: Diagnosis not present

## 2024-08-19 DIAGNOSIS — Z0000 Encounter for general adult medical examination without abnormal findings: Secondary | ICD-10-CM

## 2024-08-19 DIAGNOSIS — Z974 Presence of external hearing-aid: Secondary | ICD-10-CM

## 2024-08-19 DIAGNOSIS — N401 Enlarged prostate with lower urinary tract symptoms: Secondary | ICD-10-CM

## 2024-08-19 DIAGNOSIS — E785 Hyperlipidemia, unspecified: Secondary | ICD-10-CM

## 2024-08-19 DIAGNOSIS — F1021 Alcohol dependence, in remission: Secondary | ICD-10-CM

## 2024-08-19 DIAGNOSIS — E538 Deficiency of other specified B group vitamins: Secondary | ICD-10-CM | POA: Diagnosis not present

## 2024-08-19 DIAGNOSIS — N138 Other obstructive and reflux uropathy: Secondary | ICD-10-CM

## 2024-08-19 LAB — CBC WITH DIFFERENTIAL/PLATELET
Basophils Absolute: 0.1 K/uL (ref 0.0–0.1)
Basophils Relative: 0.8 % (ref 0.0–3.0)
Eosinophils Absolute: 0.2 K/uL (ref 0.0–0.7)
Eosinophils Relative: 2.6 % (ref 0.0–5.0)
HCT: 42.3 % (ref 39.0–52.0)
Hemoglobin: 14 g/dL (ref 13.0–17.0)
Lymphocytes Relative: 26 % (ref 12.0–46.0)
Lymphs Abs: 2 K/uL (ref 0.7–4.0)
MCHC: 33.2 g/dL (ref 30.0–36.0)
MCV: 90.5 fl (ref 78.0–100.0)
Monocytes Absolute: 0.5 K/uL (ref 0.1–1.0)
Monocytes Relative: 6.1 % (ref 3.0–12.0)
Neutro Abs: 5 K/uL (ref 1.4–7.7)
Neutrophils Relative %: 64.5 % (ref 43.0–77.0)
Platelets: 272 K/uL (ref 150.0–400.0)
RBC: 4.67 Mil/uL (ref 4.22–5.81)
RDW: 13.4 % (ref 11.5–15.5)
WBC: 7.7 K/uL (ref 4.0–10.5)

## 2024-08-19 LAB — TSH: TSH: 1.87 u[IU]/mL (ref 0.35–5.50)

## 2024-08-19 LAB — BASIC METABOLIC PANEL WITH GFR
BUN: 19 mg/dL (ref 6–23)
CO2: 30 meq/L (ref 19–32)
Calcium: 9.4 mg/dL (ref 8.4–10.5)
Chloride: 100 meq/L (ref 96–112)
Creatinine, Ser: 0.81 mg/dL (ref 0.40–1.50)
GFR: 86.52 mL/min (ref >60.00)
Glucose, Bld: 97 mg/dL (ref 70–99)
Potassium: 4.7 meq/L (ref 3.5–5.1)
Sodium: 137 meq/L (ref 135–145)

## 2024-08-19 LAB — LIPID PANEL
Cholesterol: 157 mg/dL (ref 0–200)
HDL: 60.7 mg/dL (ref >39.00)
LDL Cholesterol: 86 mg/dL (ref 0–99)
NonHDL: 96.55
Total CHOL/HDL Ratio: 3
Triglycerides: 55 mg/dL (ref 0.0–149.0)
VLDL: 11 mg/dL (ref 0.0–40.0)

## 2024-08-19 LAB — HEMOGLOBIN A1C: Hgb A1c MFr Bld: 5.7 % (ref 4.6–6.5)

## 2024-08-19 LAB — HEPATIC FUNCTION PANEL
ALT: 25 U/L (ref 0–53)
AST: 36 U/L (ref 0–37)
Albumin: 4.4 g/dL (ref 3.5–5.2)
Alkaline Phosphatase: 54 U/L (ref 39–117)
Bilirubin, Direct: 0.1 mg/dL (ref 0.0–0.3)
Total Bilirubin: 0.5 mg/dL (ref 0.2–1.2)
Total Protein: 7.2 g/dL (ref 6.0–8.3)

## 2024-08-19 LAB — VITAMIN B12: Vitamin B-12: 979 pg/mL — ABNORMAL HIGH (ref 211–911)

## 2024-08-19 LAB — PSA: PSA: 1.03 ng/mL (ref 0.10–4.00)

## 2024-08-19 NOTE — Patient Instructions (Signed)
 Good to see you today . Updating labs   Consider getting  RSV vaccine.   Continue lifestyle intervention healthy eating and exercise .  Plan yearly check or as indicated .

## 2024-08-23 ENCOUNTER — Other Ambulatory Visit: Payer: Self-pay | Admitting: Internal Medicine

## 2024-08-23 ENCOUNTER — Other Ambulatory Visit (HOSPITAL_BASED_OUTPATIENT_CLINIC_OR_DEPARTMENT_OTHER): Payer: Self-pay

## 2024-08-23 MED ORDER — TAMSULOSIN HCL 0.4 MG PO CAPS
0.4000 mg | ORAL_CAPSULE | Freq: Every day | ORAL | 3 refills | Status: AC
  Filled 2024-08-23: qty 90, 90d supply, fill #0

## 2024-08-24 ENCOUNTER — Other Ambulatory Visit (HOSPITAL_BASED_OUTPATIENT_CLINIC_OR_DEPARTMENT_OTHER): Payer: Self-pay

## 2024-09-19 ENCOUNTER — Other Ambulatory Visit: Payer: Self-pay | Admitting: Internal Medicine

## 2024-09-20 ENCOUNTER — Other Ambulatory Visit (HOSPITAL_BASED_OUTPATIENT_CLINIC_OR_DEPARTMENT_OTHER): Payer: Self-pay

## 2024-09-20 MED ORDER — SIMVASTATIN 40 MG PO TABS
40.0000 mg | ORAL_TABLET | Freq: Every day | ORAL | 3 refills | Status: AC
  Filled 2024-09-20: qty 90, 90d supply, fill #0

## 2024-09-29 ENCOUNTER — Ambulatory Visit (INDEPENDENT_AMBULATORY_CARE_PROVIDER_SITE_OTHER): Admitting: Psychology

## 2024-09-29 DIAGNOSIS — F331 Major depressive disorder, recurrent, moderate: Secondary | ICD-10-CM | POA: Diagnosis not present

## 2024-09-29 DIAGNOSIS — F411 Generalized anxiety disorder: Secondary | ICD-10-CM

## 2024-09-29 NOTE — Progress Notes (Signed)
 [facility] Counselor Initial Adult Exam  Name: Randall Torres Date: 09/29/2024 MRN: [id number] DOB: 03/14/1951 PCP: Charlett Apolinar POUR, MD      Guardian/Payee:  N/A    Paperwork requested: No   Reason for Visit /Presenting Problem: Seeking to reduce symptoms of anxiety and depression  Mental Status Exam: Appearance:   Casual     Behavior:  Appropriate  Motor:  Normal  Speech/Language:   Normal Rate  Affect:  Appropriate  Mood:  normal  Thought process:  normal  Thought content:    WNL  Sensory/Perceptual disturbances:    WNL  Orientation:  oriented to person, place, and situation  Attention:  Good  Concentration:  Good  Memory:  WNL  Fund of knowledge:   Good  Insight:    Good  Judgment:   Good  Impulse Control:  Good    Reported Symptoms:  sadness, depleted self esteem, lack of motivation, agitation/worry (anxiety),  Risk Assessment: Danger to Self:  No Self-injurious Behavior: No Danger to Others: No Duty to Warn:no Physical Aggression / Violence:No  Access to Firearms a concern: No  Gang Involvement:No  Patient / guardian was educated about steps to take if suicide or homicide risk level increases between visits: yes While future psychiatric events cannot be accurately predicted, the patient does not currently require acute inpatient psychiatric care and does not currently meet [place]  involuntary commitment criteria.  Substance Abuse  History: Current substance abuse: Yes     Past Psychiatric History:   Previous psychological history is significant for depression and alcohol abuse Outpatient Providers:unknown History of Psych Hospitalization: No  Psychological Testing: unknown   Abuse History:  Victim of: No., N/A   Report needed: No. Victim of Neglect:No. Perpetrator of N/A  Witness / Exposure to Domestic Violence: No   Protective Services Involvement: No  Witness to [employer] Violence:  No   Family History:  Family History  Problem Relation Age of Onset   Diabetes Other    Other Other  CVA age 76   Other Other        sleep problems   Myasthenia gravis Brother 32       deceased   Hyperlipidemia Other    Alcohol abuse Other        Uncle   Other Mother    Stroke Father    Diabetes Father    Throat cancer Sister    Breast cancer Sister    Healthy Son    Neuropathy Neg Hx     Living situation: the patient lives with their spouse  Sexual Orientation: Straight  Relationship Status: married  Name of spouse / other:unknown If a parent, number of children / ages:2 boys, 31 and 42  Support Systems: spouse  Surveyor, Quantity Stress:  No   Income/Employment/Disability: Neurosurgeon: unknown  Educational History: Education: post engineer, maintenance (it) work or degree  Religion/Sprituality/World View: Catholic  Any cultural differences that may affect / interfere with treatment:  N/A  Recreation/Hobbies: exercise walking  Stressors: Other: retirement    Strengths: Supportive Relationships, Family, and Church  Barriers:  unknown   Legal History: Pending legal issue / charges: The patient has no significant history of legal issues. History of legal issue / charges: N/A  Medical History/Surgical History: reviewed Past Medical History:  Diagnosis Date   ADD (attention deficit disorder)    ADJUSTMENT DISORDER 09/29/2007   Qualifier: Diagnosis of  By: Charlett MD,  Apolinar POUR    BPH (benign prostatic hyperplasia)    has seen urologist   Hyperlipidemia    Inguinal hernia    bilateral   Personal history of colonic adenoma 06/30/2008   Personal history of COVID-04 October 2020   Rhinitis 12/31/2010    Past Surgical History:  Procedure Laterality Date   CYSTOSCOPY     INGUINAL HERNIA REPAIR Bilateral 10/18/2014   Procedure: LAPAROSCOPIC BILATERAL INGUINAL HERNIA REPAIR  ;  Surgeon: Elon Pacini, MD;  Location[facility];  Service: General;  Laterality: Bilateral;   INGUINAL HERNIA REPAIR Right 07/13/2020   Procedure: RIGHT INGUINAL HERNIA REPAIR;  Surgeon: Ethyl Lenis, MD;  Location: [facility];  Service: General;  Laterality: Right;   INSERTION OF MESH Bilateral 10/18/2014   Procedure: INSERTION OF MESH;  Surgeon: Elon Pacini, MD;  [facility];  Service: General;  Laterality: Bilateral;   INSERTION OF MESH Right 07/13/2020   Procedure: INSERTION OF MESH;  Surgeon: Ethyl Lenis, MD;  Location: [facility];  Service: General;  Laterality: Right;   KNEE ARTHROSCOPY     TOTAL KNEE ARTHROPLASTY  11/08   medial compartmental    TOTAL KNEE ARTHROPLASTY Right 10/28/2022   Procedure: RIGHT TOTAL KNEE ARTHROPLASTY;  Surgeon: Liam Lerner, MD;  Location[facility];  Service: Orthopedics;  Laterality: Right;    Medications: Current Outpatient Medications  Medication Sig Dispense Refill   COVID-19 mRNA vaccine, Pfizer, (COMIRNATY ) syringe Inject into the muscle. 0.3 mL 0   simvastatin  (ZOCOR ) 40 MG tablet Take 1 tablet (40 mg total) by mouth daily at 6pm. 90 tablet 3   tamsulosin  (FLOMAX ) 0.4 MG CAPS capsule Take 1 capsule (0.4 mg total) by mouth daily. 90 capsule 3   Vibegron  (GEMTESA ) 75 MG TABS Take 1 tablet (75 mg total) by mouth daily. 90 tablet 3   No current facility-administered medications for this visit.    No Known Allergies        Goals: Engage in outpatient psychotherapy to manage anxiety and reduce  symptoms of depression (sadness, depleted esteem, low motivation). Will continue evaluation (goal date 9-31) and reduce reported symptoms (goal date 12-25). Diagnoses:  Depression, Gen Anxiety  Plan of Care: Outpatient Psychotherapy Patient was seen in the provider's office for session. Session Notes: Luan states that the holiday was nice and everyone got along. He and his wife have purchased a 1 bedroom condo in [place]. They are fixing it up. He has some anxiety about how this will change their life and the frequency of going to this condo. He acknowledged to his wife that this is primarily driven by her, but he will do his best to adjust and get on board. We discussed strategies to help him come to terms with having this second home. Hasting states that his male friend came back to meetings 3 times since thanksgiving, but he hasn't seen her in about a month. Addressed how he plans to interact with her without being overly involved.                                                                                                       CONI ALM KERNS, PhD Time Spent:8:45a-9:30p 45 minutes                "

## 2024-10-21 ENCOUNTER — Ambulatory Visit: Admitting: Psychology

## 2024-11-24 ENCOUNTER — Ambulatory Visit: Admitting: Psychology

## 2025-08-23 ENCOUNTER — Encounter: Admitting: Internal Medicine
